# Patient Record
Sex: Female | Born: 1945 | Race: Asian | Hispanic: No | Marital: Married | Smoking: Never smoker
Health system: Southern US, Community
[De-identification: ages and names within clinical notes are randomized; demographics above are authoritative.]

## PROBLEM LIST (undated history)

## (undated) DIAGNOSIS — G43009 Migraine without aura, not intractable, without status migrainosus: Secondary | ICD-10-CM

## (undated) DIAGNOSIS — T7840XA Allergy, unspecified, initial encounter: Secondary | ICD-10-CM

## (undated) DIAGNOSIS — I10 Essential (primary) hypertension: Secondary | ICD-10-CM

## (undated) DIAGNOSIS — E785 Hyperlipidemia, unspecified: Secondary | ICD-10-CM

## (undated) HISTORY — PX: POLYPECTOMY: SHX149

## (undated) HISTORY — PX: COLONOSCOPY: SHX174

## (undated) HISTORY — PX: CARPAL TUNNEL RELEASE: SHX101

## (undated) HISTORY — DX: Migraine without aura, not intractable, without status migrainosus: G43.009

## (undated) HISTORY — PX: TYMPANOSTOMY TUBE PLACEMENT: SHX32

## (undated) HISTORY — DX: Hyperlipidemia, unspecified: E78.5

## (undated) HISTORY — DX: Allergy, unspecified, initial encounter: T78.40XA

---

## 1998-01-21 ENCOUNTER — Other Ambulatory Visit: Admission: RE | Admit: 1998-01-21 | Discharge: 1998-01-21 | Payer: Self-pay | Admitting: Obstetrics and Gynecology

## 1999-02-19 ENCOUNTER — Other Ambulatory Visit: Admission: RE | Admit: 1999-02-19 | Discharge: 1999-02-19 | Payer: Self-pay | Admitting: Obstetrics and Gynecology

## 2000-02-23 ENCOUNTER — Other Ambulatory Visit: Admission: RE | Admit: 2000-02-23 | Discharge: 2000-02-23 | Payer: Self-pay | Admitting: Obstetrics and Gynecology

## 2001-01-24 ENCOUNTER — Other Ambulatory Visit: Admission: RE | Admit: 2001-01-24 | Discharge: 2001-01-24 | Payer: Self-pay | Admitting: Obstetrics and Gynecology

## 2003-11-05 ENCOUNTER — Encounter: Admission: RE | Admit: 2003-11-05 | Discharge: 2003-11-05 | Payer: Self-pay | Admitting: Family Medicine

## 2005-12-22 ENCOUNTER — Ambulatory Visit: Payer: Self-pay | Admitting: Family Medicine

## 2006-03-31 ENCOUNTER — Ambulatory Visit: Payer: Self-pay | Admitting: Internal Medicine

## 2006-05-20 ENCOUNTER — Ambulatory Visit: Payer: Self-pay | Admitting: Family Medicine

## 2006-11-23 ENCOUNTER — Encounter (INDEPENDENT_AMBULATORY_CARE_PROVIDER_SITE_OTHER): Payer: Self-pay | Admitting: Family Medicine

## 2006-12-15 ENCOUNTER — Ambulatory Visit: Payer: Self-pay | Admitting: Family Medicine

## 2006-12-15 DIAGNOSIS — G43009 Migraine without aura, not intractable, without status migrainosus: Secondary | ICD-10-CM | POA: Insufficient documentation

## 2007-01-07 ENCOUNTER — Ambulatory Visit: Payer: Self-pay | Admitting: Family Medicine

## 2007-01-07 DIAGNOSIS — J309 Allergic rhinitis, unspecified: Secondary | ICD-10-CM | POA: Insufficient documentation

## 2007-01-17 ENCOUNTER — Telehealth (INDEPENDENT_AMBULATORY_CARE_PROVIDER_SITE_OTHER): Payer: Self-pay | Admitting: Family Medicine

## 2007-01-20 ENCOUNTER — Telehealth (INDEPENDENT_AMBULATORY_CARE_PROVIDER_SITE_OTHER): Payer: Self-pay | Admitting: *Deleted

## 2007-02-21 ENCOUNTER — Ambulatory Visit: Payer: Self-pay | Admitting: Family Medicine

## 2007-02-27 LAB — CONVERTED CEMR LAB
Glucose, Bld: 99 mg/dL (ref 70–99)
TSH: 1.6 microintl units/mL (ref 0.35–5.50)
Triglycerides: 60 mg/dL (ref 0–149)
VLDL: 12 mg/dL (ref 0–40)

## 2007-02-28 ENCOUNTER — Encounter (INDEPENDENT_AMBULATORY_CARE_PROVIDER_SITE_OTHER): Payer: Self-pay | Admitting: *Deleted

## 2007-02-28 ENCOUNTER — Telehealth (INDEPENDENT_AMBULATORY_CARE_PROVIDER_SITE_OTHER): Payer: Self-pay | Admitting: *Deleted

## 2007-04-11 ENCOUNTER — Ambulatory Visit: Payer: Self-pay | Admitting: Internal Medicine

## 2007-06-13 ENCOUNTER — Ambulatory Visit: Payer: Self-pay | Admitting: Internal Medicine

## 2007-06-20 ENCOUNTER — Telehealth (INDEPENDENT_AMBULATORY_CARE_PROVIDER_SITE_OTHER): Payer: Self-pay | Admitting: *Deleted

## 2007-06-20 ENCOUNTER — Ambulatory Visit: Payer: Self-pay | Admitting: Internal Medicine

## 2007-07-01 ENCOUNTER — Telehealth (INDEPENDENT_AMBULATORY_CARE_PROVIDER_SITE_OTHER): Payer: Self-pay | Admitting: *Deleted

## 2007-07-11 ENCOUNTER — Encounter (INDEPENDENT_AMBULATORY_CARE_PROVIDER_SITE_OTHER): Payer: Self-pay | Admitting: *Deleted

## 2007-07-25 ENCOUNTER — Telehealth (INDEPENDENT_AMBULATORY_CARE_PROVIDER_SITE_OTHER): Payer: Self-pay | Admitting: *Deleted

## 2007-07-26 ENCOUNTER — Ambulatory Visit: Payer: Self-pay | Admitting: Internal Medicine

## 2007-07-27 ENCOUNTER — Encounter: Payer: Self-pay | Admitting: Internal Medicine

## 2007-07-27 ENCOUNTER — Telehealth (INDEPENDENT_AMBULATORY_CARE_PROVIDER_SITE_OTHER): Payer: Self-pay | Admitting: *Deleted

## 2007-08-01 ENCOUNTER — Telehealth (INDEPENDENT_AMBULATORY_CARE_PROVIDER_SITE_OTHER): Payer: Self-pay | Admitting: *Deleted

## 2007-11-18 ENCOUNTER — Telehealth (INDEPENDENT_AMBULATORY_CARE_PROVIDER_SITE_OTHER): Payer: Self-pay | Admitting: *Deleted

## 2007-11-22 ENCOUNTER — Ambulatory Visit: Payer: Self-pay | Admitting: Internal Medicine

## 2007-11-28 ENCOUNTER — Encounter: Payer: Self-pay | Admitting: Internal Medicine

## 2008-01-27 ENCOUNTER — Encounter: Payer: Self-pay | Admitting: Internal Medicine

## 2008-06-15 ENCOUNTER — Ambulatory Visit: Payer: Self-pay | Admitting: Internal Medicine

## 2008-11-14 ENCOUNTER — Telehealth (INDEPENDENT_AMBULATORY_CARE_PROVIDER_SITE_OTHER): Payer: Self-pay | Admitting: *Deleted

## 2008-12-07 ENCOUNTER — Encounter: Payer: Self-pay | Admitting: Internal Medicine

## 2008-12-21 ENCOUNTER — Ambulatory Visit: Payer: Self-pay | Admitting: Internal Medicine

## 2009-03-27 ENCOUNTER — Ambulatory Visit: Payer: Self-pay | Admitting: Internal Medicine

## 2009-07-10 ENCOUNTER — Telehealth (INDEPENDENT_AMBULATORY_CARE_PROVIDER_SITE_OTHER): Payer: Self-pay | Admitting: *Deleted

## 2009-07-12 ENCOUNTER — Encounter (INDEPENDENT_AMBULATORY_CARE_PROVIDER_SITE_OTHER): Payer: Self-pay | Admitting: *Deleted

## 2009-08-06 ENCOUNTER — Encounter: Payer: Self-pay | Admitting: Internal Medicine

## 2009-08-20 ENCOUNTER — Ambulatory Visit: Payer: Self-pay | Admitting: Internal Medicine

## 2009-08-23 ENCOUNTER — Telehealth (INDEPENDENT_AMBULATORY_CARE_PROVIDER_SITE_OTHER): Payer: Self-pay | Admitting: *Deleted

## 2009-08-26 ENCOUNTER — Ambulatory Visit: Payer: Self-pay | Admitting: Internal Medicine

## 2009-08-29 LAB — CONVERTED CEMR LAB: Potassium: 4.9 meq/L (ref 3.5–5.3)

## 2009-08-30 ENCOUNTER — Encounter: Payer: Self-pay | Admitting: Internal Medicine

## 2009-12-11 ENCOUNTER — Encounter: Payer: Self-pay | Admitting: Internal Medicine

## 2010-01-02 ENCOUNTER — Ambulatory Visit: Payer: Self-pay | Admitting: Internal Medicine

## 2010-04-26 ENCOUNTER — Encounter: Payer: Self-pay | Admitting: Family Medicine

## 2010-05-04 LAB — CONVERTED CEMR LAB
ALT: 19 units/L (ref 0–35)
AST: 23 units/L (ref 0–37)
Alkaline Phosphatase: 50 units/L (ref 39–117)
BUN: 18 mg/dL (ref 6–23)
Basophils Absolute: 0 10*3/uL (ref 0.0–0.1)
Calcium: 10.1 mg/dL (ref 8.4–10.5)
Eosinophils Relative: 2.5 % (ref 0.0–5.0)
GFR calc non Af Amer: 90.11 mL/min (ref >60)
Glucose, Bld: 88 mg/dL (ref 70–99)
HCT: 39.8 % (ref 36.0–46.0)
Hemoglobin: 13.9 g/dL (ref 12.0–15.0)
LDL Cholesterol: 94 mg/dL (ref 0–99)
Lymphocytes Relative: 48 % — ABNORMAL HIGH (ref 12.0–46.0)
Monocytes Relative: 7.2 % (ref 3.0–12.0)
Platelets: 214 10*3/uL (ref 150.0–400.0)
Potassium: 5.8 meq/L — ABNORMAL HIGH (ref 3.5–5.1)
RDW: 12.2 % (ref 11.5–14.6)
Sodium: 143 meq/L (ref 135–145)
TSH: 0.88 microintl units/mL (ref 0.35–5.50)
Total Bilirubin: 0.4 mg/dL (ref 0.3–1.2)
Total CHOL/HDL Ratio: 3
VLDL: 15.8 mg/dL (ref 0.0–40.0)
WBC: 4.8 10*3/uL (ref 4.5–10.5)

## 2010-05-08 NOTE — Letter (Signed)
Summary: [facility]--RX CTS release surgery   [facility]   Imported By: Lanelle Bal 09/19/2009 09:46:42  _____________________________________________________________________  External Attachment:    Type:   Image     Comment:   External Document

## 2010-05-08 NOTE — Progress Notes (Signed)
Summary: results, [facility] 5/20 pt called  Phone Note Outgoing Call Call back at [facility] Phone [phone] Call back at Work Phone [phone]   Reason for Call: Discuss lab or test results Details for Reason: advised patient her vitamin D, cholesterol levels are good Her potassium is elevated, unclear reason, please ask her to come back this afternoon or next week for a potassium recheck--- DX hyperkalemia all other labs are within normal Good results Signed by [facility] E. Paz MD on 08/23/2009 at 11:47 AM  Summary of Call: left message on machine for pt to return call...........Marland KitchenShary Decamp  Aug 23, 2009 4:37 PM   Follow-up for Phone Call        pt called back, informed of lab. lab scheduled .Kandice Hams  Aug 26, 2009 11:30 AM  Follow-up by: Kandice Hams,  Aug 26, 2009 11:30 AM

## 2010-05-08 NOTE — Letter (Signed)
Summary: Primary Care Appointment Letter  [facility] [place]  [address] [place], [place] [address]   Phone: [phone]  Fax: [phone]    07/12/2009 MRN: [id number]  Lori Goodwin [address] [place], [place]  [address]  Dear Ms. Greggory Stallion,   Your Primary Care Physician Newton E. Paz MD has indicated that:    ___x____it is time to schedule an appointment. Due for a physical    _______you missed your appointment on______ and need to call and          reschedule.    _______you need to have lab work done.    _______you need to schedule an appointment discuss lab or test results.    _______you need to call to reschedule your appointment that is                       scheduled on _________.     Please call our office as soon as possible. Our phone number is [phone]_______. Please press option 1. Our office is open 8a-12noon and 1p-5p, Monday through Friday.     Thank you,    [facility]

## 2010-05-08 NOTE — Progress Notes (Signed)
Summary: due cpx  Phone Note Call from Patient Call back at Home Phone [phone]   Summary of Call: Patient is requesting a referral to Dr. Newell Coral for neck pain.  Patient saw him a couple of years ago for the pain.  She has started having pain again & tried to make an appt but needs referral from PCP.  OK to refer even though we have not seen her for this problem? Shary Decamp  July 10, 2009 4:50 PM   Follow-up for Phone Call        okay to refer, also she is in need of a CPX.  Please arrange if patient agreeable Follow-up by: Jose E. Paz MD,  July 11, 2009 9:50 AM  Additional Follow-up for Phone Call Additional follow up Details #1::        Kendra - pls call pt to schedule CPX referral done. Shary Decamp  July 11, 2009 11:04 AM     Additional Follow-up for Phone Call Additional follow up Details #2::    LMTCB Follow-up by: Barb Merino,  July 11, 2009 11:05 AM  Additional Follow-up for Phone Call Additional follow up Details #3:: Details for Additional Follow-up Action Taken: mailed a letter Additional Follow-up by: Barb Merino,  July 12, 2009 10:25 AM

## 2010-05-08 NOTE — Assessment & Plan Note (Signed)
Summary: SINUS INF?///[facility]--NEEDED LAST APPT OF DAY///[facility]   Vital Signs:  Patient profile:   65 year old female Weight:      149.13 pounds Temp:     98.4 degrees F oral Pulse rate:   63 / minute Pulse rhythm:   regular BP sitting:   126 / 84  (left arm) Cuff size:   regular  Vitals Entered By: Army Fossa CMA (January 02, 2010 4:11 PM) CC: Pt here for a cough Comments - has had for 1 week. - having HA's also.    History of Present Illness: 2 weeks history of cough and some frontal headache which is different from her migraines  Current Medications (verified): 1)  Propranolol Hcl Cr 60 Mg Xr24h-Cap (Propranolol Hcl) .[name].. 1 By Mouth Once Daily 2)  Imitrex 50 Mg  Tabs (Sumatriptan Succinate) .... Take One To Two  Tablets Daily As Needed For Has 3)  Femhrt Low Dose 0.5-2.5 Mg-Mcg  Tabs (Norethindrone-Eth Estradiol) .[name].. 1 By Mouth Qd  Allergies (verified): No Known Drug Allergies  Past History:  Past Medical History: Reviewed history from 06/15/2008 and no changes required. Allergic rhinitis COMMON MIGRAINE    Past Surgical History: Reviewed history from 06/15/2008 and no changes required. L ear tubes   Social History: Reviewed history from 08/20/2009 and no changes required. 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 ENT:  no sore throat. Resp:  very little sputum, color?. GI:  no nausea or vomiting. MS:  no myalgias.  Physical Exam  General:  alert, well-developed, and well-nourished.  alert, well-developed, and well-nourished.   Head:  face symmetric no tenderness to palpation of the sinus area Ears:  tmpanic membranes without redness or discharge Nose:  slightly congested Mouth:  tonsils normal size, no  redness Lungs:  normal respiratory effort, no intercostal retractions, no accessory muscle use, and normal breath sounds.   Heart:  normal rate, regular rhythm, and no murmur.  normal rate, regular rhythm, and no murmur.     Impression & Recommendations:  Problem # 1:  BRONCHITIS- ACUTE (ICD-466.0) sx consistent with bronchitis, seen instructions Her updated medication list for this problem includes:    Zithromax Z-pak 250 Mg Tabs (Azithromycin) .[name]... As directed  Complete Medication List: 1)  Propranolol Hcl Cr 60 Mg Xr24h-cap (Propranolol hcl) .[name].. 1 by mouth once daily 2)  Imitrex 50 Mg Tabs (Sumatriptan succinate) .... Take one to two  tablets daily as needed for has 3)  Femhrt Low Dose 0.5-2.5 Mg-mcg Tabs (Norethindrone-eth estradiol) .[name].. 1 by mouth qd 4)  Zithromax Z-pak 250 Mg Tabs (Azithromycin) .... As directed  Patient Instructions: 1)  rest, fluids 2)  robitussin DM 2 tsp every 6 hours as needed  3)  tylenol as needed  4)  call if no better in few days  5)  came back for a flu shot once you feel better  Prescriptions: ZITHROMAX Z-PAK 250 MG TABS (AZITHROMYCIN) as directed  #1 x 0   Entered and Authorized by:   Nolon Rod. Paz MD   Signed by:   Nolon Rod. Paz MD on 01/02/2010   Method used:   Electronically to        [facility] [phone]* (retail)  [address]       [place], [place]  [address]       Ph: [phone]       Fax: [phone]   RxID:   [id number]

## 2010-05-08 NOTE — Consult Note (Signed)
Summary: [facility] --carpal tunnel syndrome  [facility]   Imported By: Lanelle Bal 08/27/2009 12:59:21  _____________________________________________________________________  External Attachment:    Type:   Image     Comment:   External Document

## 2010-05-08 NOTE — Assessment & Plan Note (Signed)
Summary: cpx/kdc   Vital Signs:  Patient profile:   65 year old female Height:      63 inches Weight:      148.6 pounds BMI:     26.42 Pulse rate:   60 / minute BP sitting:   100 / 60  Vitals Entered By: Shary Decamp (Aug 20, 2009 3:18 PM) CC: cpx, no pap (has gyn)   History of Present Illness: CPX feels well  was seen by neurosurgery for neck pain, had a  NCS    Current Medications (verified): 1)  Propranolol Hcl Cr 80 Mg  Cp24 (Propranolol Hcl) .... Take One Tablet Daily 2)  Imitrex 50 Mg  Tabs (Sumatriptan Succinate) .... Take One To Two  Tablets Daily As Needed For Has 3)  Femhrt Low Dose 0.5-2.5 Mg-Mcg  Tabs (Norethindrone-Eth Estradiol) .[name].. 1 By Mouth Qd  Allergies (verified): No Known Drug Allergies  Past History:  Past Medical History: Reviewed history from 06/15/2008 and no changes required. Allergic rhinitis COMMON MIGRAINE    Past Surgical History: Reviewed history from 06/15/2008 and no changes required. L ear tubes   Family History: Reviewed history from 12/21/2008 and no changes required. 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 ENT:  still has some allergies, occasionally cough. CV:  Denies chest pain or discomfort and swelling of feet. Resp:  Denies cough and shortness of breath. GI:  Denies bloody stools, nausea, and vomiting. MS:  occasionally L dorsum of the foot  (L). Psych:  Denies anxiety and depression.  Physical Exam  General:  alert, well-developed, and well-nourished.   Neck:  no masses, no thyromegaly, and normal carotid upstroke.   Lungs:  normal respiratory effort, no intercostal retractions, no  accessory muscle use, and normal breath sounds.   Heart:  normal rate, regular rhythm, and no murmur.   Abdomen:  soft, non-tender, no distention, no masses, no guarding, and no rigidity.   Extremities:  no pretibial edema bilaterally  Feet inspection and palpation normal  Psych:  Cognition and judgment appear intact. Alert and cooperative with normal attention span and concentration.  not anxious appearing and not depressed appearing.     Impression & Recommendations:  Problem # 1:  HEALTH SCREENING (ICD-V70.0) TD-- today has a gynecologist on menopause x 10 years, was rec to have OTC ca+ , on HRT x years never had a DEXA , i  don't see the  need for a DEXA at this point  EKG--normal sinus rhythm continue with healthy lifestyle  Orders: Venipuncture ([address]) TLB-BMP (Basic Metabolic Panel-BMET) (80048-METABOL) TLB-CBC Platelet - w/Differential (85025-CBCD) TLB-Hepatic/Liver Function Pnl (80076-HEPATIC) TLB-Lipid Panel (80061-LIPID) TLB-TSH (Thyroid Stimulating Hormone) (84443-TSH) EKG w/ Interpretation (93000) T-Vitamin D (25-Hydroxy) ([address])  Problem # 2:  History of neck pain status post evaluation by Dr. Newell Coral, had a NCS, results pending  Problem # 3:  COMMON MIGRAINE (ICD-346.10) on BB for migraine prevention would like to discontinue propranolol to see how she's doing Recommend again to discontinue propanolol, we agreed to decrease the dose from 80 to 60 mg daily. reassess later how she is doing and continue decreasing the dose if appropriate Her updated medication list for this problem  includes:    Propranolol Hcl Cr 60 Mg Xr24h-cap (Propranolol hcl) .[name]... 1 by mouth once daily    Imitrex 50 Mg Tabs (Sumatriptan succinate) .[name]... Take one to two  tablets daily as needed for has  Complete Medication List: 1)  Propranolol Hcl Cr 60 Mg Xr24h-cap (Propranolol hcl) .[name].. 1 by mouth once daily 2)  Imitrex 50 Mg Tabs (Sumatriptan succinate) .... Take one to two   tablets daily as needed for has 3)  Femhrt Low Dose 0.5-2.5 Mg-mcg Tabs (Norethindrone-eth estradiol) .[name].. 1 by mouth qd  Other Orders: Tdap => 2yrs IM ([address]) Admin 1st Vaccine ([address])  Patient Instructions: 1)  Please schedule a follow-up appointment in 1 year.  Prescriptions: PROPRANOLOL HCL CR 60 MG XR24H-CAP (PROPRANOLOL HCL) 1 by mouth once daily  #90 x 3   Entered and Authorized by:   Elita Quick E. Caitriona Sundquist MD   Signed by:   Nolon Rod. Kye Hedden MD on 08/20/2009   Method used:   Electronically to        [facility] [phone]* (retail)       [address]       [place], [place]  [address]       Ph: [phone]       Fax: [phone]   RxID:   [id number]      Immunizations Administered:  Tetanus Vaccine:    Vaccine Type: Tdap    Site: right deltoid    Dose: 0.5 ml    Route: IM    Given by: Shary Decamp    Exp. Date: 06/29/2011    Lot #: [id number]

## 2010-05-30 ENCOUNTER — Encounter: Payer: Self-pay | Admitting: Internal Medicine

## 2010-05-30 ENCOUNTER — Ambulatory Visit (INDEPENDENT_AMBULATORY_CARE_PROVIDER_SITE_OTHER): Payer: BC Managed Care – PPO | Admitting: Internal Medicine

## 2010-05-30 DIAGNOSIS — J309 Allergic rhinitis, unspecified: Secondary | ICD-10-CM

## 2010-06-03 NOTE — Assessment & Plan Note (Signed)
Summary: Congestion/drainage-CM   Vital Signs:  Patient profile:   65 year old female Height:      63 inches Weight:      149.25 pounds BMI:     26.53 Temp:     98.2 degrees F oral Pulse rate:   70 / minute Pulse rhythm:   regular BP sitting:   134 / 86  (left arm) Cuff size:   regular  Vitals Entered By: Army Fossa CMA (May 30, 2010 11:41 AM) CC: Pt here c/o nasal congestion Comments -allergies - x 4 days Taking OTC meds [facility] [address]    History of Present Illness:  symptoms started 5 days ago  complaints of runny nose with abundant clear discharge and sneezing   she is taking over-the-counter Claritin with some help    ROS No fever Denies sore throat or cough No chest congestion admits to itchy nose and slightly itchy eyes  Current Medications (verified): 1)  Propranolol Hcl Cr 60 Mg Xr24h-Cap (Propranolol Hcl) .[name].. 1 By Mouth Once Daily 2)  Imitrex 50 Mg  Tabs (Sumatriptan Succinate) .... Take One To Two  Tablets Daily As Needed For Has 3)  Femhrt Low Dose 0.5-2.5 Mg-Mcg  Tabs (Norethindrone-Eth Estradiol) .[name].. 1 By Mouth Qd  Allergies (verified): No Known Drug Allergies  Past History:  Past Medical History: Reviewed history from 06/15/2008 and no changes required. Allergic rhinitis COMMON MIGRAINE    Past Surgical History: Reviewed history from 06/15/2008 and no changes required. L ear tubes   Social History: Reviewed history from 08/20/2009 and no changes required. 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   Head:   face is symmetric, ? Slightly tender at the left maxillary sinus Ears:  R ear normal and L ear normal.   Nose:   slightly congested Mouth:   no  redness or discharge Lungs:  normal respiratory effort, no intercostal retractions, no accessory muscle use, and normal breath sounds.     Impression & Recommendations:  Problem # 1:  ALLERGIC RHINITIS (ICD-477.9)  allergies versus URI Seen instructions Her updated medication list for this problem includes:    Astepro 0.15 % Soln (Azelastine hcl) .[name]... 2 sprays on each side of the nose two times a day    Loratadine 10 Mg Tabs (Loratadine) .[name]... 1 a day as needed for allergies  Complete Medication List: 1)  Propranolol Hcl Cr 60 Mg Xr24h-cap (Propranolol hcl) .[name].. 1 by mouth once daily 2)  Imitrex 50 Mg Tabs (Sumatriptan succinate) .... Take one to two  tablets daily as needed for has 3)  Femhrt Low Dose 0.5-2.5 Mg-mcg Tabs (Norethindrone-eth estradiol) .[name].. 1 by mouth qd 4)  Astepro 0.15 % Soln (Azelastine hcl) .... 2 sprays on each side of the nose two times a day 5)  Loratadine 10 Mg Tabs (Loratadine) .[name].. 1 a day as needed for allergies  Patient Instructions: 1)  continue with claritin OTC as needed 2)  start astepro, nasal spray as prescribed, you can take this for months 3)  call if symptoms increase,  severe, fever or if no better  Prescriptions: ASTEPRO 0.15 % SOLN (AZELASTINE HCL) 2 sprays on each side of the nose two times a day  #1 x  6   Entered and Authorized by:   Nolon Rod. Pamella Samons MD   Signed by:   Nolon Rod. Brain Honeycutt MD on 05/30/2010   Method used:   Print then Give to Patient   RxID:   [id number]    Orders Added: 1)  Est. Patient Level III [14782]

## 2010-07-01 ENCOUNTER — Telehealth: Payer: Self-pay | Admitting: *Deleted

## 2010-07-01 DIAGNOSIS — J069 Acute upper respiratory infection, unspecified: Secondary | ICD-10-CM

## 2010-07-01 NOTE — Telephone Encounter (Signed)
She was seen w/ a URI, allergic to PCN Call in a zpack, if no better in few days, needs OV

## 2010-07-02 MED ORDER — AZITHROMYCIN 250 MG PO TABS: ORAL_TABLET | ORAL | Status: DC

## 2010-07-02 NOTE — Telephone Encounter (Signed)
I spoke w/ pt and she is aware that med was sent in.

## 2010-07-02 NOTE — Telephone Encounter (Signed)
Left detailed msg medication to be sent to to pharmacy.

## 2010-07-07 ENCOUNTER — Other Ambulatory Visit: Payer: Self-pay | Admitting: Internal Medicine

## 2010-07-07 NOTE — Telephone Encounter (Signed)
Ok 10, 3 RF

## 2010-08-22 ENCOUNTER — Other Ambulatory Visit: Payer: Self-pay | Admitting: Internal Medicine

## 2010-10-30 ENCOUNTER — Encounter: Payer: Self-pay | Admitting: Internal Medicine

## 2010-10-31 ENCOUNTER — Ambulatory Visit (INDEPENDENT_AMBULATORY_CARE_PROVIDER_SITE_OTHER): Payer: BC Managed Care – PPO | Admitting: Internal Medicine

## 2010-10-31 ENCOUNTER — Encounter: Payer: Self-pay | Admitting: Internal Medicine

## 2010-10-31 ENCOUNTER — Other Ambulatory Visit: Payer: Self-pay | Admitting: Internal Medicine

## 2010-10-31 DIAGNOSIS — Z780 Asymptomatic menopausal state: Secondary | ICD-10-CM

## 2010-10-31 DIAGNOSIS — G43009 Migraine without aura, not intractable, without status migrainosus: Secondary | ICD-10-CM

## 2010-10-31 DIAGNOSIS — N951 Menopausal and female climacteric states: Secondary | ICD-10-CM

## 2010-10-31 DIAGNOSIS — R5383 Other fatigue: Secondary | ICD-10-CM | POA: Insufficient documentation

## 2010-10-31 LAB — CBC WITH DIFFERENTIAL/PLATELET
Basophils Absolute: 0 10*3/uL (ref 0.0–0.1)
Basophils Relative: 0 % (ref 0–1)
Eosinophils Relative: 4 % (ref 0–5)
HCT: 40.4 % (ref 36.0–46.0)
Lymphocytes Relative: 32 % (ref 12–46)
MCHC: 33.4 g/dL (ref 30.0–36.0)
MCV: 94 fL (ref 78.0–100.0)
Monocytes Absolute: 0.4 10*3/uL (ref 0.1–1.0)
RDW: 12 % (ref 11.5–15.5)

## 2010-10-31 LAB — BASIC METABOLIC PANEL
CO2: 23 mEq/L (ref 19–32)
Glucose, Bld: 151 mg/dL — ABNORMAL HIGH (ref 70–99)
Potassium: 4.6 mEq/L (ref 3.5–5.3)
Sodium: 141 mEq/L (ref 135–145)

## 2010-10-31 MED ORDER — SUMATRIPTAN SUCCINATE 50 MG PO TABS: ORAL_TABLET | ORAL | Status: DC

## 2010-10-31 NOTE — Assessment & Plan Note (Addendum)
New problem Suspect may be related to d/c HRT Plan: Labs Restart HRT (to see if help w/ HA-fatigue) Will have to discuss w / gyn further HRT

## 2010-10-31 NOTE — Assessment & Plan Note (Signed)
Increase HAs in the last 2 weeks, although the location is slt different that traditional migraines, HA still respond to imitrex. Suspect increase in HA related to d/c of HRT. Plan: Restart HRT If HAs better,  then she needs to dicuss w/ gyn alternatives for HRT

## 2010-10-31 NOTE — Patient Instructions (Signed)
Go back to hormones, if you feels better, talk to your gynecologist about other options If you are not better, then call me

## 2010-10-31 NOTE — Progress Notes (Signed)
  Subjective:    Patient ID: Lori Goodwin, female    DOB: May 15, 1945, 65 y.o.   MRN: [id number]  HPI For the last 2 weeks, she has not been feeling well. Report increasing frequency of headaches, the location is slightly different that previous migraines, lately located at the forehead; she wakes up most days with a headache, takes an Imitrex and feels better the rest of the day. Also complains of fatigue, described as just a lack of energy. Coincidentally,earlier  this month her gynecologist recommended to stop HRT.  Past Medical History  Diagnosis Date  . Allergic rhinitis   . Common migraine    Past Surgical History  Procedure Date  . Tympanostomy tube placement      Review of Systems Denies fever or chills No cough or shortness of breath Denies any sinus congestion, sinus pain, runny nose, itchy eyes or sneezing.    Objective:   Physical Exam  Constitutional: She is oriented to person, place, and time. She appears well-developed and well-nourished. No distress.  HENT:  Head: Normocephalic and atraumatic.  Right Ear: External ear normal.  Left Ear: External ear normal.  Cardiovascular: Normal rate, regular rhythm and normal heart sounds.   No murmur heard. Pulmonary/Chest: Effort normal and breath sounds normal. No respiratory distress. She has no wheezes. She has no rales.  Neurological: She is alert and oriented to person, place, and time.       Speech, gait and motor exam are normal  Skin: She is not diaphoretic.  Psychiatric: She has a normal mood and affect. Her behavior is normal. Judgment and thought content normal.          Assessment & Plan:   Fatigue New problem Suspect may be related to d/c HRT Plan: Labs Restart HRT (to see if help w/ HA-fatigue) Will have to discuss w / gyn further HRT  COMMON MIGRAINE Increase HAs in the last 2 weeks, although the location is slt different that traditional migraines, HA still respond to imitrex. Suspect  increase in HA related to d/c of HRT. Plan: Restart HRT If HAs better,  then she needs to dicuss w/ gyn alternatives for HRT

## 2010-11-04 ENCOUNTER — Telehealth: Payer: Self-pay | Admitting: *Deleted

## 2010-11-04 NOTE — Telephone Encounter (Signed)
Message left for patient to return my call. Sent over add on

## 2010-11-04 NOTE — Telephone Encounter (Signed)
Spoke w/ pt aware of labs and information

## 2010-11-04 NOTE — Telephone Encounter (Signed)
Message copied by Leanne Lovely on Tue Nov 04, 2010  9:51 AM ------      Message from: Lori Goodwin      Created: Mon Nov 03, 2010 10:03 PM       Advise patient:      Labs ok , just the sugar slt elevated, add A1C--- dx hyperglycemia

## 2010-11-05 LAB — HEMOGLOBIN A1C: Mean Plasma Glucose: 120 mg/dL — ABNORMAL HIGH (ref <117)

## 2010-11-10 ENCOUNTER — Telehealth: Payer: Self-pay | Admitting: *Deleted

## 2010-11-10 NOTE — Telephone Encounter (Signed)
Pt left message on triage voicemail returning your call. Pt can be reached at [phone].

## 2010-11-10 NOTE — Telephone Encounter (Signed)
Message copied by Romualdo Bolk on Mon Nov 10, 2010 10:06 AM ------      Message from: Lori Goodwin      Created: Mon Nov 10, 2010  9:29 AM       Advise patient, her sugar was a little high, a diabetes test was slt elevated as well, no need for any medications his just needs to keep herself active and eat healthy, this number call hemoglobin A1c needs to be rechecked when she comes back

## 2010-11-10 NOTE — Telephone Encounter (Signed)
Left message to call back  

## 2010-11-10 NOTE — Telephone Encounter (Signed)
Pt aware of results 

## 2010-12-03 ENCOUNTER — Other Ambulatory Visit: Payer: Self-pay | Admitting: Internal Medicine

## 2010-12-03 NOTE — Telephone Encounter (Signed)
Rx Done . 

## 2011-01-01 ENCOUNTER — Encounter: Payer: Self-pay | Admitting: Internal Medicine

## 2011-02-23 ENCOUNTER — Other Ambulatory Visit: Payer: Self-pay | Admitting: Internal Medicine

## 2011-02-23 MED ORDER — SUMATRIPTAN SUCCINATE 50 MG PO TABS: ORAL_TABLET | ORAL | Status: DC

## 2011-02-23 NOTE — Telephone Encounter (Signed)
Rx sent to pharamcy

## 2011-03-10 ENCOUNTER — Other Ambulatory Visit: Payer: Self-pay | Admitting: Internal Medicine

## 2011-06-08 ENCOUNTER — Other Ambulatory Visit: Payer: Self-pay | Admitting: Internal Medicine

## 2011-06-09 NOTE — Telephone Encounter (Signed)
Refill done.  

## 2011-06-22 ENCOUNTER — Telehealth: Payer: Self-pay | Admitting: Internal Medicine

## 2011-06-22 MED ORDER — AZELASTINE HCL 0.1 % NA SOLN: 2.0000 | Freq: Every day | NASAL | Status: DC

## 2011-06-22 NOTE — Telephone Encounter (Signed)
Patient has an appointment for 3.20-to see dr. Drue Novel, but she would like to know if she could get a nasal spray called in to help her until the appointment to help her sleep? Patient ph# [phone]

## 2011-06-22 NOTE — Telephone Encounter (Signed)
Discuss with patient  

## 2011-06-22 NOTE — Telephone Encounter (Signed)
Advise pt , i sent a Rx for astelin

## 2011-06-24 ENCOUNTER — Ambulatory Visit: Payer: BC Managed Care – PPO | Admitting: Internal Medicine

## 2011-07-07 ENCOUNTER — Ambulatory Visit (INDEPENDENT_AMBULATORY_CARE_PROVIDER_SITE_OTHER): Payer: BC Managed Care – PPO | Admitting: Internal Medicine

## 2011-07-07 ENCOUNTER — Encounter: Payer: Self-pay | Admitting: Internal Medicine

## 2011-07-07 VITALS — BP 124/86 | HR 61 | Temp 98.3°F | Wt 146.0 lb

## 2011-07-07 DIAGNOSIS — R21 Rash and other nonspecific skin eruption: Secondary | ICD-10-CM

## 2011-07-07 DIAGNOSIS — B9789 Other viral agents as the cause of diseases classified elsewhere: Secondary | ICD-10-CM

## 2011-07-07 DIAGNOSIS — B349 Viral infection, unspecified: Secondary | ICD-10-CM

## 2011-07-07 MED ORDER — HYDROCORTISONE 2.5 % EX CREA: TOPICAL_CREAM | Freq: Two times a day (BID) | CUTANEOUS | Status: DC

## 2011-07-07 MED ORDER — AZELASTINE HCL 0.1 % NA SOLN: 2.0000 | Freq: Every evening | NASAL | Status: DC | PRN

## 2011-07-07 MED ORDER — AMOXICILLIN 500 MG PO CAPS: 1000.0000 mg | ORAL_CAPSULE | Freq: Two times a day (BID) | ORAL | Status: AC

## 2011-07-07 NOTE — Progress Notes (Signed)
  Subjective:    Patient ID: Lori Goodwin, female    DOB: 26-Nov-1945, 66 y.o.   MRN: [id number]  HPI  Acute visit Has a rash in the neck for more than a year, itchy. 2 days ago developed body aches, cough with a very small amount of clear sputum production, mild headaches and sinus pressure.  Past medical history Allergic rhinitis Migraines  Past surgical history Tympanostomy tube placement  Review of Systems Has some subjective fever and chills No nausea, vomiting, diarrhea. No chest congestion, wheezing or shortness of breath No sore throat. Some postnasal drip but not able to blow anything out of her nose.     Objective:   Physical Exam  Skin:       General -- alert, well-developed, and well-nourished. NAD  Neck --no LADs HEENT -- TMs normal, throat w/o redness, face symmetric and slt  tender to palpation on all sinuses, EOMI Lungs -- normal respiratory effort, no intercostal retractions, no accessory muscle use, and normal breath sounds.   Heart-- normal rate, regular rhythm, no murmur, and no gallop.   Skin -- see graphic  Extremities-- no pretibial edema bilaterally        Assessment & Plan:   viral syndrome, early sinusitis?. See instructions Rash, likely is atopic dermatitis. We'll try a steroid cream. If not better may need to be seen by dermatology

## 2011-07-07 NOTE — Patient Instructions (Signed)
Rest, fluids  For cough, take Mucinex DM twice a day as needed  For congestion use astelin nasal spray twice a day until you feel better Take the antibiotic as prescribed  (Amoxicillin) only if no better in few days  Call if no better in few days Call anytime if the symptoms are severe ------------------------------------------------------ Use the cream twice a day x 2 weeks, call if the rash gets worse

## 2011-09-22 ENCOUNTER — Ambulatory Visit (INDEPENDENT_AMBULATORY_CARE_PROVIDER_SITE_OTHER): Payer: BC Managed Care – PPO | Admitting: Internal Medicine

## 2011-09-22 VITALS — BP 116/78 | HR 58 | Temp 98.2°F | Wt 147.0 lb

## 2011-09-22 DIAGNOSIS — F419 Anxiety disorder, unspecified: Secondary | ICD-10-CM

## 2011-09-22 DIAGNOSIS — F411 Generalized anxiety disorder: Secondary | ICD-10-CM

## 2011-09-22 NOTE — Progress Notes (Signed)
  Subjective:    Patient ID: Lori Goodwin, female    DOB: 04/15/1945, 66 y.o.   MRN: [id number]  HPI Acute visit Patient comes today because she is very stressed out. Her son is paraplegic, he lives with her, he is going through depression, is not taking care of himself, history of substance abuse. His license was revoked due to a DWI.  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 No suicidal thoughts.     Objective:   Physical Exam  Alert oriented x3. She seems to stress but not depressed or extremely anxious. Coherent and pleasent     Assessment & Plan:

## 2011-09-22 NOTE — Assessment & Plan Note (Addendum)
Patient under a lot of stress due to family situation, see history of present illness. Her son is my patient as well, he will be seen this week. I counseled Lori Goodwin the best I could, because her son is my patient I can only listen but I can't discuss any information with her.  She requested me to set up counseling for him etc. unfortunately her son is completely competent to make his own decisions hence I can't force him to go through rehabilitation or see a counselor. Patient herself denies the need for medication

## 2011-09-23 ENCOUNTER — Encounter: Payer: Self-pay | Admitting: Internal Medicine

## 2011-09-30 ENCOUNTER — Ambulatory Visit: Payer: BC Managed Care – PPO | Admitting: Internal Medicine

## 2011-11-17 ENCOUNTER — Encounter: Payer: Self-pay | Admitting: Internal Medicine

## 2011-11-17 ENCOUNTER — Ambulatory Visit (INDEPENDENT_AMBULATORY_CARE_PROVIDER_SITE_OTHER): Payer: BC Managed Care – PPO | Admitting: Internal Medicine

## 2011-11-17 VITALS — BP 122/70 | HR 58 | Temp 97.5°F | Wt 152.6 lb

## 2011-11-17 DIAGNOSIS — M5481 Occipital neuralgia: Secondary | ICD-10-CM

## 2011-11-17 DIAGNOSIS — M5090 Cervical disc disorder, unspecified, unspecified cervical region: Secondary | ICD-10-CM

## 2011-11-17 DIAGNOSIS — M531 Cervicobrachial syndrome: Secondary | ICD-10-CM

## 2011-11-17 MED ORDER — GABAPENTIN 100 MG PO CAPS: ORAL_CAPSULE | ORAL | Status: DC

## 2011-11-17 MED ORDER — CYCLOBENZAPRINE HCL 5 MG PO TABS: ORAL_TABLET | ORAL | Status: AC

## 2011-11-17 NOTE — Progress Notes (Signed)
  Subjective:    Patient ID: Lori Goodwin, female    DOB: Feb 06, 1946, 66 y.o.   MRN: [id number]  HPI She describes tightness and pain at the base of the neck for the last week. There was no trigger or injury prior to the neck symptoms. Upon awakening she is noting constant gnnaging headache in both occipital areas since the neck pain started. She has a history of migraines has been taking generic Imitrex for the headaches with partial response. Advil did help the neck pain to some degree  Significantly to 4 years ago she had an MRI and saw Dr. Newell Coral, neurosurgeon for degenerative disc disease of the cervical spine.    Review of Systems History of repetitive motion:  Yes, caring for new grandchild   Numbness/tingling:  No except pre-existing CTS  Weakness: no Fever/chills /sweats:  no Bowel/bladder dysfunction:  no       Objective:   Physical Exam  Gen. appearance: Well-nourished, in no distress Eyes: Extraocular motion intact, field of vision normal, vision grossly intact with lenses, no nystagmus ENT: Canals :wax bilaterally,  tuning fork exam normal, hearing grossly decreased on L (chronic) Neck: Decreased range of motion, no masses, normal thyroid Cardiovascular: Rate and rhythm normal; Grade 1/6 systolic  Murmur; no gallops or extra heart sounds Muscle skeletal: Range of motion, tone, &  strength normal Neuro:no cranial nerve deficit, deep tendon  reflexes normal, gait normal. Romberg testing and finger to nose testing normal Lymph: No cervical or axillary LA Skin: Warm and dry without suspicious lesions or rashes Psych: no anxiety or mood change. Normally interactive and cooperative.         Assessment & Plan:  #1 neck pain due to degenerative disc disease; exacerbation issue repetitive motion caring for a new grandchild  #2 headache due to occipital neuritis secondary to #1. 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

## 2011-11-17 NOTE — Patient Instructions (Addendum)
Use an anti-inflammatory cream such as Aspercreme or Zostrix cream twice a day to the affected area as needed. In lieu of this warm moist compresses or  hot water bottle can be used. Do not apply ice . Use a cervical memory foam pillow to prevent hyperextension or hyperflexion of the cervical spine. 

## 2011-12-24 ENCOUNTER — Other Ambulatory Visit: Payer: Self-pay | Admitting: Internal Medicine

## 2011-12-25 ENCOUNTER — Encounter: Payer: Self-pay | Admitting: Internal Medicine

## 2012-01-03 ENCOUNTER — Other Ambulatory Visit: Payer: Self-pay | Admitting: Internal Medicine

## 2012-01-04 NOTE — Telephone Encounter (Signed)
Advise patient, I sent a prescription to her pharmacy but she is overdue for a checkup. Please arrange

## 2012-01-04 NOTE — Telephone Encounter (Signed)
Pt over due for labs. Refill request. Plz advise        MW

## 2012-01-04 NOTE — Telephone Encounter (Signed)
Left detailed msg to have pt call & schedule appt.

## 2012-01-15 ENCOUNTER — Ambulatory Visit (INDEPENDENT_AMBULATORY_CARE_PROVIDER_SITE_OTHER): Payer: BC Managed Care – PPO | Admitting: Internal Medicine

## 2012-01-15 ENCOUNTER — Encounter: Payer: Self-pay | Admitting: Internal Medicine

## 2012-01-15 VITALS — BP 138/82 | HR 51 | Temp 98.0°F | Wt 151.0 lb

## 2012-01-15 DIAGNOSIS — F419 Anxiety disorder, unspecified: Secondary | ICD-10-CM

## 2012-01-15 DIAGNOSIS — M1990 Unspecified osteoarthritis, unspecified site: Secondary | ICD-10-CM

## 2012-01-15 DIAGNOSIS — R21 Rash and other nonspecific skin eruption: Secondary | ICD-10-CM

## 2012-01-15 MED ORDER — HYDROCORTISONE 2.5 % EX CREA: TOPICAL_CREAM | Freq: Two times a day (BID) | CUTANEOUS | Status: DC

## 2012-01-15 MED ORDER — CYCLOBENZAPRINE HCL 10 MG PO TABS: 10.0000 mg | ORAL_TABLET | Freq: Every evening | ORAL | Status: DC | PRN

## 2012-01-15 NOTE — Progress Notes (Signed)
  Subjective:    Patient ID: Lori Goodwin, female    DOB: July 15, 1945, 66 y.o.   MRN: [id number]  HPI Followup visit. Was recently seen by Dr. Alwyn Ren with neck pain, did take gabapentin temporarily with mild decrease of her symptoms, however her pain is not resolved. Again the pain is at the back of the neck and at the occipital area, worse with hyperextension neck and when she turns to the sides. No radiation. Pain is definitely different than a migraine.   Past medical history Allergic rhinitis Migraines  Past surgical history Tympanostomy tube placement  Family History: colon ca--no breast ca--no DM--no MI--no  Social History: original from [place] Occupation:Teacher Divorced, [name] her son lives w/ her   3 children ,1 son is paraplegic , has another that is a MD,other a PA Never Smoked Alcohol use-no   Review of Systems She continue with a lot of stress. Denies upper or lower ext paresthesias No fever or chills No aches or myalgias    Objective:   Physical Exam  General -- alert, well-developed . No apparent distress.  Neck -- range of motion normal except for limited extension. Some pain elicited when she turns to the right. Cervical spine is nontender to palpation. Heart-- normal rate, regular rhythm, no murmur, and no gallop.   Extremities-- no pretibial edema bilaterally  Neurologic-- alert & oriented X3 ; face symmetric, gait normal, speech fluent, DTRs and strength normal in all extremities. Psych-- Cognition and judgment appear intact. Alert and cooperative with normal attention span and concentration.  not anxious appearing and not depressed appearing.      Assessment & Plan:  Declined a flu shot

## 2012-01-15 NOTE — Assessment & Plan Note (Signed)
Continue with a lot of stress in her life,  declines any treatment with medication

## 2012-01-15 NOTE — Patient Instructions (Addendum)
We are referring you to Dr. Newell Coral For pain, take Tylenol  500 mg OTC 2 tabs a day every 8 hours as needed for pain You can also take Flexeril as needed, at bedtime. This is a muscle relaxant, will make you sleepy.

## 2012-01-15 NOTE — Assessment & Plan Note (Addendum)
Persisting neck pain with mechanical features. The try gabapentin with modest results but does not like to take a lot of medicine. She has seen before by Dr.Nudelman, neurosurgery. Will refer back to him. In the meantime I offered her a steroid pack and Flexeril, she agreed to get Flexeril in case she needs it (side effects discussed ) See instructions.

## 2012-01-15 NOTE — Assessment & Plan Note (Signed)
Occasional rash for which she takes hydrocortisone when necessary

## 2012-03-26 ENCOUNTER — Emergency Department (HOSPITAL_COMMUNITY)
Admission: EM | Admit: 2012-03-26 | Discharge: 2012-03-26 | Disposition: A | Discharge status: Home / Self Care | Payer: BC Managed Care – PPO | Attending: Emergency Medicine | Admitting: Emergency Medicine

## 2012-03-26 ENCOUNTER — Emergency Department (HOSPITAL_COMMUNITY): Payer: BC Managed Care – PPO

## 2012-03-26 ENCOUNTER — Encounter (HOSPITAL_COMMUNITY): Payer: Self-pay | Admitting: *Deleted

## 2012-03-26 DIAGNOSIS — R413 Other amnesia: Secondary | ICD-10-CM | POA: Insufficient documentation

## 2012-03-26 DIAGNOSIS — I10 Essential (primary) hypertension: Secondary | ICD-10-CM | POA: Insufficient documentation

## 2012-03-26 DIAGNOSIS — Z79899 Other long term (current) drug therapy: Secondary | ICD-10-CM | POA: Insufficient documentation

## 2012-03-26 DIAGNOSIS — G43909 Migraine, unspecified, not intractable, without status migrainosus: Secondary | ICD-10-CM | POA: Insufficient documentation

## 2012-03-26 DIAGNOSIS — J309 Allergic rhinitis, unspecified: Secondary | ICD-10-CM | POA: Insufficient documentation

## 2012-03-26 HISTORY — DX: Essential (primary) hypertension: I10

## 2012-03-26 LAB — CBC WITH DIFFERENTIAL/PLATELET
Basophils Absolute: 0 10*3/uL (ref 0.0–0.1)
Basophils Relative: 1 % (ref 0–1)
Eosinophils Relative: 2 % (ref 0–5)
Lymphocytes Relative: 28 % (ref 12–46)
MCHC: 35.6 g/dL (ref 30.0–36.0)
MCV: 88.2 fL (ref 78.0–100.0)
Neutro Abs: 3.7 10*3/uL (ref 1.7–7.7)
Platelets: 211 10*3/uL (ref 150–400)
RDW: 11.8 % (ref 11.5–15.5)
WBC: 5.6 10*3/uL (ref 4.0–10.5)

## 2012-03-26 LAB — COMPREHENSIVE METABOLIC PANEL
ALT: 22 U/L (ref 0–35)
AST: 23 U/L (ref 0–37)
Albumin: 4.2 g/dL (ref 3.5–5.2)
CO2: 24 mEq/L (ref 19–32)
Calcium: 10.5 mg/dL (ref 8.4–10.5)
Creatinine, Ser: 0.66 mg/dL (ref 0.50–1.10)
GFR calc non Af Amer: >90 mL/min (ref >90)
Sodium: 140 mEq/L (ref 135–145)

## 2012-03-26 NOTE — ED Notes (Signed)
Pt sts she went shopping with her husband, sts she felt very tired and fell asleep, woke up and couldn't remember what she did today; a/ox4

## 2012-03-26 NOTE — ED Notes (Signed)
Bedside report received from previous RN 

## 2012-03-26 NOTE — ED Provider Notes (Signed)
History     CSN: [id number]  Arrival date & time 03/26/12  1627   First MD Initiated Contact with Patient 03/26/12 1701      Chief Complaint  Patient presents with  . Memory Loss    (Consider location/radiation/quality/duration/timing/severity/associated sxs/prior treatment) HPI Comments: The patient presents here with complaints of memory loss.  She reports going shopping this morning with her friend and shortly after returning home she was unable to recall what she had done.  She called her friend to ask him what had happened.  He told her that they had been shopping and she initially had no recollection of this.  She says that her memory is returning somewhat now.  She denies having hit her head.  There is no headache and no fevers or chills.  She denies drug or alcohol intake and denies having been started on any new medications.    The history is provided by the patient.    Past Medical History  Diagnosis Date  . Allergic rhinitis   . Common migraine   . Hypertension     Past Surgical History  Procedure Date  . Tympanostomy tube placement     Family History  Problem Relation Age of Onset  . Colon cancer Neg Hx   . Breast cancer Neg Hx   . Diabetes Neg Hx   . Heart attack Neg Hx     History  Substance Use Topics  . Smoking status: Never Smoker   . Smokeless tobacco: Not on file  . Alcohol Use: No    OB History    Grav Para Term Preterm Abortions TAB SAB Ect Mult Living                  Review of Systems  All other systems reviewed and are negative.    Allergies  Tylenol  Home Medications   Current Outpatient Rx  Name  Route  Sig  Dispense  Refill  . CYCLOBENZAPRINE HCL 10 MG PO TABS   Oral   Take 1 tablet (10 mg total) by mouth at bedtime as needed for muscle spasms.   21 tablet   0   . GABAPENTIN 100 MG PO CAPS      One pill every eight hours as needed; dose may be increased by one pill each dose after 72 hours if only partially  effective   30 capsule   2   . HYDROCORTISONE 2.5 % EX CREA   Topical   Apply topically 2 (two) times daily.   30 g   1   . PROPRANOLOL HCL ER 60 MG PO CP24      TAKE 1 CAPSULE BY MOUTH ONCE DAILY   90 capsule   1   . SUMATRIPTAN SUCCINATE 50 MG PO TABS      TAKE 1 TO 2 TABLETS BY MOUTH DAILY AS NEEDED FOR HEADACHES   12 tablet   1     BP 172/81  Pulse 90  Temp 97.9 F (36.6 C) (Oral)  Resp 17  Ht 5\' 2"  (1.575 m)  Wt 140 lb (63.504 kg)  BMI 25.61 kg/m2  SpO2 100%  Physical Exam  Nursing note and vitals reviewed. Constitutional: She is oriented to person, place, and time. She appears well-developed and well-nourished. No distress.  HENT:  Head: Normocephalic and atraumatic.  Eyes: EOM are normal. Pupils are equal, round, and reactive to light.  Neck: Normal range of motion. Neck supple.  Cardiovascular: Normal rate and regular  rhythm.  Exam reveals no gallop and no friction rub.   No murmur heard. Pulmonary/Chest: Effort normal and breath sounds normal. No respiratory distress. She has no wheezes.  Abdominal: Soft. Bowel sounds are normal. She exhibits no distension. There is no tenderness.  Musculoskeletal: Normal range of motion.  Neurological: She is alert and oriented to person, place, and time. No cranial nerve deficit. She exhibits normal muscle tone. Coordination normal.  Skin: Skin is warm and dry. She is not diaphoretic.    ED Course  Procedures (including critical care time)   Labs Reviewed  CBC WITH DIFFERENTIAL  COMPREHENSIVE METABOLIC PANEL   No results found.   No diagnosis found.    MDM  The patient presents here with memory loss of this morning's shopping trip.  She has no other symptoms and she is neurologically intact.  The workup reveals a negative ct scan and labs that are unremarkable.  She will be discharged to home, return prn.        Geoffery Lyons, MD 03/26/12 1919

## 2012-03-29 ENCOUNTER — Ambulatory Visit (INDEPENDENT_AMBULATORY_CARE_PROVIDER_SITE_OTHER): Payer: BC Managed Care – PPO | Admitting: Internal Medicine

## 2012-03-29 VITALS — BP 112/68 | HR 53 | Temp 98.1°F | Wt 151.0 lb

## 2012-03-29 DIAGNOSIS — M1990 Unspecified osteoarthritis, unspecified site: Secondary | ICD-10-CM

## 2012-03-29 DIAGNOSIS — R413 Other amnesia: Secondary | ICD-10-CM | POA: Insufficient documentation

## 2012-03-29 LAB — TSH: TSH: 1.14 u[IU]/mL (ref 0.35–5.50)

## 2012-03-29 LAB — FOLATE: Folate: >24.8 ng/mL (ref >5.9)

## 2012-03-29 LAB — VITAMIN B12: Vitamin B-12: 844 pg/mL (ref 211–911)

## 2012-03-29 NOTE — Assessment & Plan Note (Signed)
Ongoing neck pain, status post neurosurgical eval, MRI was done, I don't have the report however the patient was told that she had DJD and there was nothing surgical that could help her with. She continue with some pain, I offered her referral to a non  Interventional specialist for possible physical therapy and/or  a local injection but she declined. She will call me when ready.

## 2012-03-29 NOTE — Progress Notes (Signed)
  Subjective:    Patient ID: Lori Goodwin, female    DOB: 05-15-1945, 66 y.o.   MRN: [id number]  HPI ER. Went to the ER 03/26/2012 after she was unable to recall a shopping trip she had earlier. She was with a friend, she couldn't remember what she did that morning and she "kept asking" her friend what they did . Today, he is able to recall the shopping trip but have only minimal recollection of the ER visit, for instance she doesn't recall when they drew her blood.  Also, she continue with neck pain, see assessment and plan.   Past Medical History  Diagnosis Date  . Allergic rhinitis   . Common migraine   . Hypertension    Past Surgical History  Procedure Date  . Tympanostomy tube placement    Social History:  original from [place]  Occupation:Teacher  Divorced, [name] her son lives w/ her  3 children ,1 son is paraplegic , has another that is a MD,other a PA  Never Smoked  Alcohol use-no   Review of Systems At the time of amnesia, she did not have any sore speech, motor deficits, b/b incontinence. She does have migraines and take Imitrex 3 times a week, the morning of the ER eval she did take  Imitrex. She's not taking any new medications he specifically not takinggabapentin or nabumetone which was prescribed for neck pain a while back. Not taking Flexeril either.     Objective:   Physical Exam General -- alert, well-developed, and well-nourished.   Neck -- normal carotid pulses without bruit Lungs -- normal respiratory effort, no intercostal retractions, no accessory muscle use, and normal breath sounds.   Heart-- normal rate, regular rhythm, no murmur, and no gallop.    Extremities-- no pretibial edema bilaterally  ion. Neurologic-- alert & oriented X3 , speech, gait and motor are intact. Cranial nerves II - XII normal Psych-- Cognition and judgment appear intact. Alert and cooperative with normal attention span and concentration.   Not depressed appearing,  she is mild- moderately anxious which is her baseline    Assessment & Plan:

## 2012-03-29 NOTE — Assessment & Plan Note (Addendum)
Transient amnesia a few days ago , on clinical grounds, there is no evidence of a TIA, seizure. She's not taking any medication that may trigger amnesia. CT of the head was normal, electrolytes and CBC normal as well. I told the patient  her symptoms could be a primary problem versus an atypical migraine or related to anxiety as she has a very stressful life. I tried to reassure her but I think he needs more answers. Plan: B12, folic acid ,TSH Referred to neurology.

## 2012-03-31 ENCOUNTER — Encounter: Payer: Self-pay | Admitting: *Deleted

## 2012-03-31 ENCOUNTER — Encounter: Payer: Self-pay | Admitting: Internal Medicine

## 2012-06-03 ENCOUNTER — Telehealth: Payer: Self-pay | Admitting: *Deleted

## 2012-06-03 NOTE — Telephone Encounter (Signed)
Pt left msg on vmail stating that she has a runny nose & congestion. Pt is requesting a prescription be called in to pharmacy. Please advise.

## 2012-06-06 NOTE — Telephone Encounter (Signed)
lmovm for pt to return call.  

## 2012-06-06 NOTE — Telephone Encounter (Signed)
Send flonase 2 sprays on each side of the nose qd until better, #1, 1 RF mucinex DM prn OV  if no better

## 2012-06-07 NOTE — Telephone Encounter (Signed)
lmovm for pt to return call.  

## 2012-06-15 ENCOUNTER — Telehealth: Payer: Self-pay | Admitting: Internal Medicine

## 2012-06-15 NOTE — Telephone Encounter (Signed)
Patient Information:  Caller Name: Lori Goodwin  Phone: [phone]  Patient: Lori Goodwin, Lori Goodwin  Gender: Female  DOB: 09-10-1945  Age: 67 Years  PCP: Willow Ora  Office Follow Up:  Does the office need to follow up with this patient?: No  Instructions For The Office: N/A   Symptoms  Reason For Call & Symptoms: Allergy symptoms:  Itchy nose, sneezing, runny nose.  Started Clariton and this has helped these sx's.  Hears a cracking in the right ear with chewing, yawning.  No change in hearing.  Reviewed Health History In EMR: Yes  Reviewed Medications In EMR: Yes  Reviewed Allergies In EMR: Yes  Reviewed Surgeries / Procedures: Yes  Date of Onset of Symptoms: 06/10/2012  Treatments Tried: Clariton has helped except for the crackling in ear.  Treatments Tried Worked: No  Guideline(s) Used:  Ear - Congestion  Disposition Per Guideline:   Home Care  Reason For Disposition Reached:   Ear congestion  Advice Given:  N/A

## 2012-06-16 NOTE — Telephone Encounter (Signed)
noted 

## 2012-07-05 ENCOUNTER — Other Ambulatory Visit: Payer: Self-pay | Admitting: Internal Medicine

## 2012-07-05 NOTE — Telephone Encounter (Signed)
Refill done.  

## 2012-07-06 ENCOUNTER — Telehealth: Payer: Self-pay | Admitting: *Deleted

## 2012-07-06 NOTE — Telephone Encounter (Signed)
rec Flonase 2 sprays on each side of the nose qd #1, 6 RF OV if no better

## 2012-07-06 NOTE — Telephone Encounter (Signed)
Pt called stating that she has been sneezing & itchy/runny nose. Pt states she has been taking claritin OTC but has not been helping. Pt is requesting a med be sent into her pharmacy. Please advise.

## 2012-07-07 MED ORDER — FLUTICASONE PROPIONATE 50 MCG/ACT NA SUSP: 2.0000 | Freq: Every day | NASAL | Status: DC

## 2012-07-07 NOTE — Telephone Encounter (Signed)
Left detailed msg on pt's vmail. Refill done.

## 2012-09-17 ENCOUNTER — Other Ambulatory Visit: Payer: Self-pay | Admitting: Internal Medicine

## 2012-09-19 NOTE — Telephone Encounter (Signed)
Refill done.  

## 2012-11-03 ENCOUNTER — Ambulatory Visit (INDEPENDENT_AMBULATORY_CARE_PROVIDER_SITE_OTHER): Payer: BC Managed Care – PPO | Admitting: Internal Medicine

## 2012-11-03 ENCOUNTER — Encounter: Payer: Self-pay | Admitting: Internal Medicine

## 2012-11-03 VITALS — BP 130/82 | HR 53 | Temp 98.1°F | Wt 152.2 lb

## 2012-11-03 DIAGNOSIS — M1990 Unspecified osteoarthritis, unspecified site: Secondary | ICD-10-CM

## 2012-11-03 MED ORDER — CYCLOBENZAPRINE HCL 10 MG PO TABS: 10.0000 mg | ORAL_TABLET | Freq: Every evening | ORAL | Status: DC | PRN

## 2012-11-03 NOTE — Patient Instructions (Addendum)
Flexeril at night only Heating pad Call if not improving after physical therapy

## 2012-11-03 NOTE — Progress Notes (Signed)
  Subjective:    Patient ID: Lori Goodwin, female    DOB: [birth year], 67 y.o.   MRN: [id number]  HPI Acute visit Ongoing neck pain, mostly at night, at the cervical spine with some radiation to the nuchal area. Is worse with certain head motions, family suggest physical therapy and she is interested, would like a referral. Has been reluctant to take any medication  Past Medical History  Diagnosis Date  . Allergic rhinitis   . Common migraine   . Hypertension    Past Surgical History  Procedure Laterality Date  . Tympanostomy tube placement      Social History:  original from [place]  Occupation:Teacher  Divorced, [name] her son lives w/ her  3 children ,1 son is paraplegic , has another that is a MD,other a PA  Never Smoked  Alcohol use-no  Review of Systems Denies any radiation to the extremities, no paresthesias in the upper or lower extremities. Gait is not impaired. No bladder or bowel incontinence.     Objective:   Physical Exam General -- alert, well-developed, NAD Neck - Not tender to palpation of the cervical spine, range of motion normal except for some limitation when she attemps hyperextension Extremities-- no pretibial edema bilaterally  Neurologic-- alert & oriented X3; Speech, gait, DTRs and motor exam normal/symmetric. Psych-- Cognition and judgment appear intact. Alert and cooperative with normal attention span and concentration.  not anxious appearing and not depressed appearing.       Assessment & Plan:

## 2012-11-03 NOTE — Assessment & Plan Note (Signed)
Continue with neck pain, see previous entry. Plan: PT referral Flexeril Local heat. See instructions

## 2012-11-16 ENCOUNTER — Telehealth: Payer: Self-pay | Admitting: Internal Medicine

## 2012-11-16 NOTE — Telephone Encounter (Signed)
Please advise 

## 2012-11-16 NOTE — Telephone Encounter (Signed)
Patient called stating that she cannot do physical therapy because she has a $700 deductible. She would like to know if there is somewhere she can go that counts as an office visit so she can just pay her copay.

## 2012-11-16 NOTE — Telephone Encounter (Signed)
I dont' know of any place. This needs to go to the doctor.

## 2012-11-16 NOTE — Telephone Encounter (Signed)
I don't think so, sorry

## 2012-11-17 NOTE — Telephone Encounter (Signed)
Discussed with patient, verbalized understanding. Pt. Given # to [facility] Out[facility]. Physical therapy per pt. Request.

## 2012-12-28 ENCOUNTER — Encounter: Payer: Self-pay | Admitting: *Deleted

## 2012-12-28 ENCOUNTER — Encounter: Payer: Self-pay | Admitting: Internal Medicine

## 2012-12-28 LAB — HM MAMMOGRAPHY: HM Mammogram: NEGATIVE

## 2013-01-06 ENCOUNTER — Other Ambulatory Visit: Payer: Self-pay | Admitting: Internal Medicine

## 2013-01-06 NOTE — Telephone Encounter (Signed)
Imitrex refill sent to pharmacy

## 2013-01-14 ENCOUNTER — Other Ambulatory Visit: Payer: Self-pay | Admitting: Internal Medicine

## 2013-01-16 ENCOUNTER — Other Ambulatory Visit: Payer: Self-pay | Admitting: *Deleted

## 2013-01-16 MED ORDER — PROPRANOLOL HCL ER 60 MG PO CP24: 60.0000 mg | ORAL_CAPSULE | Freq: Every day | ORAL | Status: DC

## 2013-01-16 NOTE — Telephone Encounter (Signed)
Propranolol refill sent to pharmacy 

## 2013-02-14 ENCOUNTER — Encounter: Payer: Self-pay | Admitting: Internal Medicine

## 2013-03-18 ENCOUNTER — Other Ambulatory Visit: Payer: Self-pay | Admitting: Internal Medicine

## 2013-03-20 NOTE — Telephone Encounter (Signed)
rx refilled per protocol. DJR  

## 2013-03-25 ENCOUNTER — Encounter: Payer: Self-pay | Admitting: Family Medicine

## 2013-03-25 ENCOUNTER — Ambulatory Visit (INDEPENDENT_AMBULATORY_CARE_PROVIDER_SITE_OTHER): Payer: BC Managed Care – PPO | Admitting: Family Medicine

## 2013-03-25 VITALS — BP 130/78 | HR 62 | Temp 97.8°F | Resp 16 | Wt 155.8 lb

## 2013-03-25 DIAGNOSIS — J329 Chronic sinusitis, unspecified: Secondary | ICD-10-CM

## 2013-03-25 MED ORDER — PROMETHAZINE-DM 6.25-15 MG/5ML PO SYRP: 5.0000 mL | ORAL_SOLUTION | Freq: Four times a day (QID) | ORAL | Status: DC | PRN

## 2013-03-25 MED ORDER — AMOXICILLIN 875 MG PO TABS: 875.0000 mg | ORAL_TABLET | Freq: Two times a day (BID) | ORAL | Status: DC

## 2013-03-25 NOTE — Patient Instructions (Signed)
Follow up as needed Start the Amoxicillin twice daily- take w/ food Drink plenty of fluids REST! Use the cough syrup as needed- will cause drowsiness Add Mucinex to thin your congestion and drainage Call with any questions or concerns Happy Holidays!!!

## 2013-03-25 NOTE — Assessment & Plan Note (Signed)
New.  Pt's sxs and PE consistent w/ infxn.  Start abx.  Cough meds prn.  Reviewed supportive care and red flags that should prompt return.  Pt expressed understanding and is in agreement w/ plan.  

## 2013-03-25 NOTE — Progress Notes (Signed)
   Subjective:    Patient ID: Lori Goodwin, female    DOB: 1945-07-07, 67 y.o.   MRN: [id number]  HPI URI- sxs started Tuesday afternoon.  Alternating chills and sweats.  + cough- productive.  + nasal congestion.  + sinus pressure.  Bilateral ear fullness.  + sick contacts.  No N/V/D.  + anorexia.   Review of Systems For ROS see HPI     Objective:   Physical Exam  Vitals reviewed. Constitutional: She appears well-developed and well-nourished. No distress.  HENT:  Head: Normocephalic and atraumatic.  Right Ear: Tympanic membrane normal.  Left Ear: Tympanic membrane normal.  Nose: Mucosal edema and rhinorrhea present. Right sinus exhibits maxillary sinus tenderness and frontal sinus tenderness. Left sinus exhibits maxillary sinus tenderness and frontal sinus tenderness.  Mouth/Throat: Uvula is midline and mucous membranes are normal. Posterior oropharyngeal erythema present. No oropharyngeal exudate.  Eyes: Conjunctivae and EOM are normal. Pupils are equal, round, and reactive to light.  Neck: Normal range of motion. Neck supple.  Cardiovascular: Normal rate, regular rhythm and normal heart sounds.   Pulmonary/Chest: Effort normal and breath sounds normal. No respiratory distress. She has no wheezes.  Lymphadenopathy:    She has no cervical adenopathy.          Assessment & Plan:

## 2013-04-11 ENCOUNTER — Telehealth: Payer: Self-pay | Admitting: Internal Medicine

## 2013-04-11 NOTE — Telephone Encounter (Signed)
Recommend Mucinex DM OTC 1 tablet twice a day until better. If that has been tried and is not working let me know, we can use a syrup with hydrocodone

## 2013-04-11 NOTE — Telephone Encounter (Signed)
[facility] for a sinus infection and says that everything has gotten better accept for a cough she has. Wants to know if Dr. Larose Kells can rx or recommend something for it. Please advise.

## 2013-05-19 ENCOUNTER — Other Ambulatory Visit: Payer: Self-pay | Admitting: Internal Medicine

## 2013-06-29 ENCOUNTER — Other Ambulatory Visit: Payer: Self-pay | Admitting: Internal Medicine

## 2013-06-29 ENCOUNTER — Telehealth: Payer: Self-pay | Admitting: Internal Medicine

## 2013-06-29 DIAGNOSIS — F419 Anxiety disorder, unspecified: Secondary | ICD-10-CM

## 2013-06-29 DIAGNOSIS — F411 Generalized anxiety disorder: Secondary | ICD-10-CM

## 2013-06-29 NOTE — Telephone Encounter (Signed)
Done

## 2013-06-29 NOTE — Telephone Encounter (Signed)
Are you ok with referral? °

## 2013-06-29 NOTE — Telephone Encounter (Signed)
Patient would like a referral to see a psychiatrist. 

## 2013-06-29 NOTE — Telephone Encounter (Signed)
Please arrange, DX anxiety

## 2013-07-03 ENCOUNTER — Telehealth: Payer: Self-pay | Admitting: Internal Medicine

## 2013-07-03 NOTE — Telephone Encounter (Signed)
07/03/13 pt called in regards to a voice mail left regaring a referral.  Pt could not understand the voice mail and wants whoever called to call the cell phone [phone], not the house phone.

## 2013-07-24 ENCOUNTER — Other Ambulatory Visit: Payer: Self-pay | Admitting: Internal Medicine

## 2013-07-25 ENCOUNTER — Encounter: Payer: Self-pay | Admitting: Internal Medicine

## 2013-07-25 NOTE — Telephone Encounter (Signed)
Rx sent to the pharmacy by e-script.//AB/CMA 

## 2013-08-01 ENCOUNTER — Encounter: Payer: Self-pay | Admitting: Internal Medicine

## 2013-08-01 ENCOUNTER — Ambulatory Visit (INDEPENDENT_AMBULATORY_CARE_PROVIDER_SITE_OTHER): Payer: BC Managed Care – PPO | Admitting: Internal Medicine

## 2013-08-01 VITALS — BP 103/68 | HR 60 | Temp 98.0°F | Wt 161.0 lb

## 2013-08-01 DIAGNOSIS — R21 Rash and other nonspecific skin eruption: Secondary | ICD-10-CM

## 2013-08-01 DIAGNOSIS — M1990 Unspecified osteoarthritis, unspecified site: Secondary | ICD-10-CM

## 2013-08-01 NOTE — Assessment & Plan Note (Signed)
Thick, persisting rash at the nuchal area. Refer to dermatology

## 2013-08-01 NOTE — Progress Notes (Signed)
   Subjective:    Patient ID: Lori Goodwin, female    DOB: 07-13-1945, 68 y.o.   MRN: [id number]  DOS:  08/01/2013 Type of  visit: Acute visit, several concerns Persistent rash in the neck, occasional itching. DJD, continue with neck pain, needs a parking permit. 3 days ago had left ear pain, symptoms are now  gone.    ROS No recent URI, ear discharge or bleeding. Has an appointment pending to see a psychiatrist, mostly to help her son. She herself is not depressed, denies any suicidal ideas.  Past Medical History  Diagnosis Date  . Allergic rhinitis   . Common migraine   . Hypertension     Past Surgical History  Procedure Laterality Date  . Tympanostomy tube placement      History   Social History  . Marital Status: Married    Spouse Name: N/A    Number of Children: 3  . Years of Education: N/A   Occupational History  . [employer]   Social History Main Topics  . Smoking status: Never Smoker   . Smokeless tobacco: Never Used  . Alcohol Use: No  . Drug Use: Not on file  . Sexual Activity: Not on file   Other Topics Concern  . Not on file   Social History Narrative   Original from [place]   son lives w/ her             Medication List       This list is accurate as of: 08/01/13  5:29 PM.  Always use your most recent med list.               cyclobenzaprine 10 MG tablet  Commonly known as:  FLEXERIL  Take 1 tablet (10 mg total) by mouth at bedtime as needed (neck pain).     fluticasone 50 MCG/ACT nasal spray  Commonly known as:  FLONASE  Place 2 sprays into the nose daily.     multivitamin with minerals Tabs tablet  Take 1 tablet by mouth daily.     propranolol ER 60 MG 24 hr capsule  Commonly known as:  INDERAL LA  TAKE 1 CAPSULE BY MOUTH EVERY DAY     SUMAtriptan 50 MG tablet  Commonly known as:  IMITREX  TAKE 1 TO 2 TABLETS BY MOUTH AS NEEDED FOR HEADACHE AS DIRECTED           Objective:   Physical  Exam BP 103/68  Pulse 60  Temp(Src) 98 F (36.7 C)  Wt 161 lb (73.029 kg)  SpO2 94% General -- alert, well-developed, NAD.  HEENT-- Not pale. Dry and hard  wax in both ears, canal is now. Skin-- plaque of thick, silver color skin @ the nuchal area Neurologic--  alert & oriented X3.    Psych-- Cognition and judgment appear intact. Cooperative with normal attention span and concentration. No anxious or depressed appearing.       Assessment & Plan:   Cerumen impaction, recommend peroxide for 2 weeks, return to the office to see if this needed or possible to do a lavage

## 2013-08-01 NOTE — Assessment & Plan Note (Signed)
Ongoing issues with neck pain,requests a parking permit ---> provided

## 2013-08-01 NOTE — Patient Instructions (Addendum)
Use peroxide 3 - 4 drops on eache ear daily for 2 weeks  Come back and let us check the ears again  Also, please schedule a fasting physical exam at your convenience

## 2013-08-05 ENCOUNTER — Other Ambulatory Visit: Payer: Self-pay | Admitting: Internal Medicine

## 2013-08-25 ENCOUNTER — Ambulatory Visit (INDEPENDENT_AMBULATORY_CARE_PROVIDER_SITE_OTHER): Payer: BC Managed Care – PPO | Admitting: Internal Medicine

## 2013-08-25 VITALS — BP 138/62 | HR 64 | Temp 98.0°F | Wt 161.0 lb

## 2013-08-25 DIAGNOSIS — H6120 Impacted cerumen, unspecified ear: Secondary | ICD-10-CM

## 2013-08-25 NOTE — Progress Notes (Signed)
   Subjective:    Patient ID: Lori Goodwin, female    DOB: Apr 21, 1945, 68 y.o.   MRN: [id number]  DOS:  08/25/2013 Type of  visit: Acute visit  Cerumen impaction  ROS No pain or ear d/c. Some pruritus   Past Medical History  Diagnosis Date  . Allergic rhinitis   . Common migraine   . Hypertension     Past Surgical History  Procedure Laterality Date  . Tympanostomy tube placement      History   Social History  . Marital Status: Married    Spouse Name: N/A    Number of Children: 3  . Years of Education: N/A   Occupational History  . [employer]   Social History Main Topics  . Smoking status: Never Smoker   . Smokeless tobacco: Never Used  . Alcohol Use: No  . Drug Use: Not on file  . Sexual Activity: Not on file   Other Topics Concern  . Not on file   Social History Narrative   [place] from [place]   son lives w/ her             Medication List       This list is accurate as of: 08/25/13 11:59 PM.  Always use your most recent med list.               clobetasol 0.05 % external solution  Commonly known as:  TEMOVATE     cyclobenzaprine 10 MG tablet  Commonly known as:  FLEXERIL  Take 1 tablet (10 mg total) by mouth at bedtime as needed (neck pain).     fluticasone 50 MCG/ACT nasal spray  Commonly known as:  FLONASE  Place 2 sprays into the nose daily.     multivitamin with minerals Tabs tablet  Take 1 tablet by mouth daily.     propranolol ER 60 MG 24 hr capsule  Commonly known as:  INDERAL LA  TAKE 1 CAPSULE BY MOUTH EVERY DAY     SUMAtriptan 50 MG tablet  Commonly known as:  IMITREX  TAKE 1 TO 2 TABLETS BY MOUTH AS NEEDED FOR HEADACHE AS DIRECTED           Objective:   Physical Exam BP 138/62  Pulse 64  Temp(Src) 98 F (36.7 C) (Oral)  Wt 161 lb (73.029 kg)  SpO2 98%  General -- alert, well-developed, NAD.  B ear impaction Wax removed from the L w/ a spoon and lavaged, TM normal, mild bleeding  at the canal  R-- unable to remove w/ forceps or spoon Psych-- Cognition and judgment appear intact. Cooperative with normal attention span and concentration. No anxious or depressed appearing.       Assessment & Plan:    Cerumen impaction,    left cleared, partially removed on the right Recommend peroxide daily for 2 weeks, then come back, we'll attempt to clear the right side. She did have some bleeding on the left, will call if pain, swelling or discharge

## 2013-08-25 NOTE — Progress Notes (Signed)
Pre-visit discussion using our clinic review tool. No additional management support is needed unless otherwise documented below in the visit note.  

## 2013-09-04 ENCOUNTER — Encounter: Payer: Self-pay | Admitting: Internal Medicine

## 2013-09-04 ENCOUNTER — Ambulatory Visit (INDEPENDENT_AMBULATORY_CARE_PROVIDER_SITE_OTHER): Payer: BC Managed Care – PPO | Admitting: Internal Medicine

## 2013-09-04 VITALS — BP 111/60 | HR 60 | Temp 98.7°F | Wt 162.0 lb

## 2013-09-04 DIAGNOSIS — H6120 Impacted cerumen, unspecified ear: Secondary | ICD-10-CM

## 2013-09-04 NOTE — Progress Notes (Signed)
   Subjective:    Patient ID: Lori Goodwin, female    DOB: 1946/03/22, 68 y.o.   MRN: [id number]  DOS:  09/04/2013 Type of  Visit: Followup from previous visit History: Since the last time she was here, she is using peroxide on the ears. Right ear still feels clogged  ROS   Past Medical History  Diagnosis Date  . Allergic rhinitis   . Common migraine   . Hypertension     Past Surgical History  Procedure Laterality Date  . Tympanostomy tube placement      History   Social History  . Marital Status: Married    Spouse Name: N/A    Number of Children: 3  . Years of Education: N/A   Occupational History  . [employer]   Social History Main Topics  . Smoking status: Never Smoker   . Smokeless tobacco: Never Used  . Alcohol Use: No  . Drug Use: Not on file  . Sexual Activity: Not on file   Other Topics Concern  . Not on file   Social History Narrative   [place] from [place]   son lives w/ her             Medication List       This list is accurate as of: 09/04/13  1:22 PM.  Always use your most recent med list.               clobetasol 0.05 % external solution  Commonly known as:  TEMOVATE     cyclobenzaprine 10 MG tablet  Commonly known as:  FLEXERIL  Take 1 tablet (10 mg total) by mouth at bedtime as needed (neck pain).     fluticasone 50 MCG/ACT nasal spray  Commonly known as:  FLONASE  Place 2 sprays into the nose daily.     multivitamin with minerals Tabs tablet  Take 1 tablet by mouth daily.     propranolol ER 60 MG 24 hr capsule  Commonly known as:  INDERAL LA  TAKE 1 CAPSULE BY MOUTH EVERY DAY     SUMAtriptan 50 MG tablet  Commonly known as:  IMITREX  TAKE 1 TO 2 TABLETS BY MOUTH AS NEEDED FOR HEADACHE AS DIRECTED           Objective:   Physical Exam BP 111/60  Pulse 60  Temp(Src) 98.7 F (37.1 C)  Wt 162 lb (73.483 kg)  SpO2 99%  General -- alert, well-developed, NAD.    HEENT-- L ear--  wnl R ear-- impacted, partially removed w/ a spoon Psych-- Cognition and judgment appear intact. Cooperative with normal attention span and concentration. No anxious or depressed appearing.       Assessment & Plan:   Cerumen impaction Right ear was lavaged, post lavage exam is essentially normal. She did have some bleeding from the spoon. Recommend peroxide twice a week and call if needed

## 2013-09-04 NOTE — Progress Notes (Signed)
Pre visit review using our clinic review tool, if applicable. No additional management support is needed unless otherwise documented below in the visit note. 

## 2013-10-16 ENCOUNTER — Other Ambulatory Visit: Payer: Self-pay | Admitting: Internal Medicine

## 2013-12-01 ENCOUNTER — Other Ambulatory Visit: Payer: Self-pay | Admitting: Internal Medicine

## 2014-01-24 ENCOUNTER — Other Ambulatory Visit: Payer: Self-pay | Admitting: Internal Medicine

## 2014-02-19 ENCOUNTER — Encounter: Payer: Self-pay | Admitting: Internal Medicine

## 2014-02-19 ENCOUNTER — Ambulatory Visit (INDEPENDENT_AMBULATORY_CARE_PROVIDER_SITE_OTHER): Payer: BC Managed Care – PPO | Admitting: Internal Medicine

## 2014-02-19 VITALS — BP 132/68 | HR 49 | Temp 97.9°F | Ht 62.0 in | Wt 156.0 lb

## 2014-02-19 DIAGNOSIS — Z23 Encounter for immunization: Secondary | ICD-10-CM

## 2014-02-19 DIAGNOSIS — R21 Rash and other nonspecific skin eruption: Secondary | ICD-10-CM

## 2014-02-19 DIAGNOSIS — Z0000 Encounter for general adult medical examination without abnormal findings: Secondary | ICD-10-CM

## 2014-02-19 NOTE — Patient Instructions (Signed)
Stop by the front desk and schedule labs to be done within few days (fasting)  Check the  blood pressure monthly  Be sure your blood pressure is between  145/85  and 110/65.  if it is consistently higher or lower, let me know    Please come back to the office in 1 year  for a physical exam. Come back fasting

## 2014-02-19 NOTE — Progress Notes (Signed)
Subjective:    Patient ID: Lori Goodwin, female    DOB: 14-Apr-1945, 68 y.o.   MRN: [id number]  DOS:  02/19/2014 Type of visit - description : cpx Interval history: in general feels well   ROS No  CP, SOB Denies  nausea, vomiting diarrhea, blood in the stools (-) cough, sputum production (-) wheezing, chest congestion No dysuria, gross hematuria, difficulty urinating  No anxiety, depression    Past Medical History  Diagnosis Date  . Allergic rhinitis   . Common migraine   . Hypertension     Past Surgical History  Procedure Laterality Date  . Tympanostomy tube placement      History   Social History  . Marital Status: Married    Spouse Name: N/A    Number of Children: 3  . Years of Education: N/A   Occupational History  . [employer]   Social History Main Topics  . Smoking status: Never Smoker   . Smokeless tobacco: Never Used  . Alcohol Use: No  . Drug Use: Not on file  . Sexual Activity: Not on file   Other Topics Concern  . Not on file   Social History Narrative   Original from [place]   son lives w/ her          Family History  Problem Relation Age of Onset  . Colon cancer Neg Hx   . Breast cancer Neg Hx   . Diabetes Neg Hx   . Heart attack Neg Hx        Medication List       This list is accurate as of: 02/19/14  8:16 AM.  Always use your most recent med list.               clobetasol 0.05 % external solution  Commonly known as:  TEMOVATE     cyclobenzaprine 10 MG tablet  Commonly known as:  FLEXERIL  Take 1 tablet (10 mg total) by mouth at bedtime as needed (neck pain).     fluticasone 50 MCG/ACT nasal spray  Commonly known as:  FLONASE  Place 2 sprays into the nose daily.     multivitamin with minerals Tabs tablet  Take 1 tablet by mouth daily.     propranolol ER 60 MG 24 hr capsule  Commonly known as:  INDERAL LA  Take 1 tablet daily. NEEDS APPT WITH DR PAZ FOR ANY FURTHER REFILLS  [phone].     SUMAtriptan 50 MG tablet  Commonly known as:  IMITREX  TAKE 1 TO 2 TABLETS BY MOUTH AS NEEDED FOR HEADACHE AS DIRECTED           Objective:   Physical Exam BP 160/93 mmHg  Pulse 49  Temp(Src) 97.9 F (36.6 C) (Oral)  Ht 5\' 2"  (1.575 m)  Wt 156 lb (70.761 kg)  BMI 28.53 kg/m2  SpO2 99%  General -- alert, well-developed, NAD.  Neck --no thyromegaly  HEENT-- Not pale.   Lungs -- normal respiratory effort, no intercostal retractions, no accessory muscle use, and normal breath sounds.  Heart-- normal rate, regular rhythm, no murmur.  Abdomen-- Not distended, good bowel sounds,soft, non-tender. No rebound or rigidity.  Extremities-- no pretibial edema bilaterally  Neurologic--  alert & oriented X3. Speech normal, gait appropriate for age, strength symmetric and appropriate for age.    Psych-- Cognition and judgment appear intact. Cooperative with normal attention span and concentration. No anxious or depressed appearing.  Assessment & Plan:

## 2014-02-19 NOTE — Assessment & Plan Note (Addendum)
Td 2011 Flu shot--declined, states she had a reaction before Pneumonia shot provided today zostavax discussed  Female care at [facility] Reports a mammogram 12-2013 Menopause, never had a bone density test, for now rec  To continue calcium and vitamin D supplements  Colonoscopy screening, reports previous negative Hemoccults at gynecology, she likes to a colonoscopy and will call me when ready.  Other problems: BP slightly elevated, recheck and is 132/68. Recommend to check BPs from time to time Migraine headaches currently well controlled, needs Imitrex one or twice a month which is good control for the patient.   Labs  Continue with healthy lifestyle, reports she exercises 4 times a week and eat healthy

## 2014-02-19 NOTE — Progress Notes (Signed)
Pre visit review using our clinic review tool, if applicable. No additional management support is needed unless otherwise documented below in the visit note. 

## 2014-02-19 NOTE — Assessment & Plan Note (Signed)
Saw dermatology, rash resolved

## 2014-02-23 ENCOUNTER — Other Ambulatory Visit (INDEPENDENT_AMBULATORY_CARE_PROVIDER_SITE_OTHER): Payer: BC Managed Care – PPO

## 2014-02-23 DIAGNOSIS — Z0000 Encounter for general adult medical examination without abnormal findings: Secondary | ICD-10-CM

## 2014-02-23 LAB — LIPID PANEL
CHOLESTEROL: 172 mg/dL (ref 0–200)
HDL: 51.1 mg/dL (ref >39.00)
LDL Cholesterol: 108 mg/dL — ABNORMAL HIGH (ref 0–99)
NonHDL: 120.9
Total CHOL/HDL Ratio: 3
Triglycerides: 63 mg/dL (ref 0.0–149.0)
VLDL: 12.6 mg/dL (ref 0.0–40.0)

## 2014-02-23 LAB — CBC WITH DIFFERENTIAL/PLATELET
Basophils Absolute: 0 10*3/uL (ref 0.0–0.1)
Basophils Relative: 0.5 % (ref 0.0–3.0)
EOS PCT: 2.9 % (ref 0.0–5.0)
Eosinophils Absolute: 0.2 10*3/uL (ref 0.0–0.7)
HCT: 39.3 % (ref 36.0–46.0)
Hemoglobin: 13.3 g/dL (ref 12.0–15.0)
LYMPHS PCT: 24.3 % (ref 12.0–46.0)
Lymphs Abs: 1.7 10*3/uL (ref 0.7–4.0)
MCHC: 33.8 g/dL (ref 30.0–36.0)
MCV: 92.1 fl (ref 78.0–100.0)
MONO ABS: 0.4 10*3/uL (ref 0.1–1.0)
MONOS PCT: 5.5 % (ref 3.0–12.0)
NEUTROS PCT: 66.8 % (ref 43.0–77.0)
Neutro Abs: 4.7 10*3/uL (ref 1.4–7.7)
PLATELETS: 209 10*3/uL (ref 150.0–400.0)
RBC: 4.26 Mil/uL (ref 3.87–5.11)
RDW: 12.4 % (ref 11.5–15.5)
WBC: 7 10*3/uL (ref 4.0–10.5)

## 2014-02-23 LAB — COMPREHENSIVE METABOLIC PANEL
ALBUMIN: 4.3 g/dL (ref 3.5–5.2)
ALT: 18 U/L (ref 0–35)
AST: 20 U/L (ref 0–37)
Alkaline Phosphatase: 73 U/L (ref 39–117)
BUN: 19 mg/dL (ref 6–23)
CALCIUM: 9.3 mg/dL (ref 8.4–10.5)
CHLORIDE: 105 meq/L (ref 96–112)
CO2: 27 meq/L (ref 19–32)
Creatinine, Ser: 0.7 mg/dL (ref 0.4–1.2)
GFR: 94.79 mL/min (ref >60.00)
GLUCOSE: 103 mg/dL — AB (ref 70–99)
POTASSIUM: 3.8 meq/L (ref 3.5–5.1)
SODIUM: 137 meq/L (ref 135–145)
TOTAL PROTEIN: 7 g/dL (ref 6.0–8.3)
Total Bilirubin: 0.9 mg/dL (ref 0.2–1.2)

## 2014-02-23 LAB — TSH: TSH: 1.49 u[IU]/mL (ref 0.35–4.50)

## 2014-02-26 ENCOUNTER — Telehealth: Payer: Self-pay | Admitting: Internal Medicine

## 2014-02-26 ENCOUNTER — Other Ambulatory Visit: Payer: Self-pay | Admitting: Internal Medicine

## 2014-02-26 NOTE — Telephone Encounter (Signed)
Spoke with Pt, gave her Dr. Larose Kells recommendations. Informed her that all labs were normal and to let us know if she is not better in a few days. Pt verbalized understanding.

## 2014-02-26 NOTE — Telephone Encounter (Signed)
Please advise 

## 2014-02-26 NOTE — Telephone Encounter (Signed)
Had a PNM shot few days ago: rec rest-fluis-tylenol-mucinex DM  Call if not gradually better in the next few days Also-- all her labs were ok, good results

## 2014-02-26 NOTE — Telephone Encounter (Signed)
Caller name: Tanja, Gift Relation to pt: self  Call back [phone] [facility]acy: [facility] [phone]  Reason for call:  Pt states every since she received the phenomena vaccination she has been feeling sick, sore throat, coughing. Pt in need of clinical advice regarding a OTC. Please advise

## 2014-04-04 ENCOUNTER — Encounter: Payer: Self-pay | Admitting: Physician Assistant

## 2014-04-04 ENCOUNTER — Ambulatory Visit (INDEPENDENT_AMBULATORY_CARE_PROVIDER_SITE_OTHER): Payer: BC Managed Care – PPO | Admitting: Physician Assistant

## 2014-04-04 VITALS — BP 140/78 | HR 58 | Temp 98.2°F | Resp 16 | Ht 62.0 in | Wt 154.0 lb

## 2014-04-04 DIAGNOSIS — H6123 Impacted cerumen, bilateral: Secondary | ICD-10-CM

## 2014-04-04 DIAGNOSIS — H6983 Other specified disorders of Eustachian tube, bilateral: Secondary | ICD-10-CM

## 2014-04-04 MED ORDER — FLUTICASONE PROPIONATE 50 MCG/ACT NA SUSP: 2.0000 | Freq: Every day | NASAL | Status: DC

## 2014-04-04 NOTE — Progress Notes (Signed)
Pre visit review using our clinic review tool, if applicable. No additional management support is needed unless otherwise documented below in the visit note/SLS  

## 2014-04-04 NOTE — Progress Notes (Signed)
Patient presents to clinic today c/o itching and fullness of ears bilaterally.  Denies ear pain, tinnitus.  Has noted some mild decreased hearing bilaterally.  Patient denies hx of loud-noise exposure. Does have significant history of cerumen impaction.  Past Medical History  Diagnosis Date  . Allergic rhinitis   . Common migraine   . Hypertension     Current Outpatient Prescriptions on File Prior to Visit  Medication Sig Dispense Refill  . clobetasol (TEMOVATE) 0.05 % external solution     . Multiple Vitamin (MULTIVITAMIN WITH MINERALS) TABS Take 1 tablet by mouth daily.    . propranolol ER (INDERAL LA) 60 MG 24 hr capsule Take 1 tablet daily. NEEDS APPT WITH DR PAZ FOR ANY FURTHER REFILLS [phone]. (Patient taking differently: Take 60 mg by mouth daily. ) 90 capsule 0  . SUMAtriptan (IMITREX) 50 MG tablet TAKE 1 TO 2 TABLETS BY MOUTH AS NEEDED FOR HEADACHE AS DIRECTED 12 tablet 0   No current facility-administered medications on file prior to visit.    Allergies  Allergen Reactions  . Tylenol [Acetaminophen] Shortness Of Breath    Family History  Problem Relation Age of Onset  . Colon cancer Neg Hx   . Breast cancer Neg Hx   . Diabetes Neg Hx   . Heart attack Neg Hx     History   Social History  . Marital Status: Married    Spouse Name: N/A    Number of Children: 3  . Years of Education: N/A   Occupational History  . [employer]   Social History Main Topics  . Smoking status: Never Smoker   . Smokeless tobacco: Never Used  . Alcohol Use: No  . Drug Use: None  . Sexual Activity: None   Other Topics Concern  . None   Social History Narrative   [place] from [place]   Divorced, [place] her son used to  lives w/ her     3 children ,1 son is paraplegic , has another that is a MD,other a PA      Review of Systems - See HPI.  All other ROS are negative.  BP 140/78 mmHg  Pulse 58  Temp(Src) 98.2 F (36.8 C) (Oral)  Resp 16  Ht 5'  2" (1.575 m)  Wt 154 lb (69.854 kg)  BMI 28.16 kg/m2  SpO2 100%  Physical Exam  Constitutional: She is oriented to person, place, and time and well-developed, well-nourished, and in no distress.  HENT:  Head: Normocephalic and atraumatic.  Right Ear: Tympanic membrane and ear canal normal.  Left Ear: Tympanic membrane and ear canal normal.  Nose: Nose normal.  Mouth/Throat: Oropharynx is clear and moist. No oropharyngeal exudate.  On initial inspection, TM are obscured bilaterally due to cerumen impaction.  Upon reexamination after cerumen removal, canal and TM within normal limits.  Eyes: Conjunctivae are normal.  Neck: Neck supple. No thyromegaly present.  Cardiovascular: Normal rate, regular rhythm, normal heart sounds and intact distal pulses.   Pulmonary/Chest: Effort normal and breath sounds normal. No respiratory distress. She has no wheezes. She has no rales. She exhibits no tenderness.  Lymphadenopathy:    She has no cervical adenopathy.  Neurological: She is alert and oriented to person, place, and time.  Skin: Skin is warm and dry. No rash noted.  Psychiatric: Affect normal.  Vitals reviewed.   Recent Results (from the past 2160 hour(s))  Comprehensive metabolic panel     Status: Abnormal  Collection Time: 02/23/14  8:32 AM  Result Value Ref Range   Sodium 137 135 - 145 mEq/L   Potassium 3.8 3.5 - 5.1 mEq/L   Chloride 105 96 - 112 mEq/L   CO2 27 19 - 32 mEq/L   Glucose, Bld 103 (H) 70 - 99 mg/dL   BUN 19 6 - 23 mg/dL   Creatinine, Ser 0.7 0.4 - 1.2 mg/dL   Total Bilirubin 0.9 0.2 - 1.2 mg/dL   Alkaline Phosphatase 73 39 - 117 U/L   AST 20 0 - 37 U/L   ALT 18 0 - 35 U/L   Total Protein 7.0 6.0 - 8.3 g/dL   Albumin 4.3 3.5 - 5.2 g/dL   Calcium 9.3 8.4 - 10.5 mg/dL   GFR 94.79 >60.00 mL/min  CBC with Differential     Status: None   Collection Time: 02/23/14  8:32 AM  Result Value Ref Range   WBC 7.0 4.0 - 10.5 K/uL   RBC 4.26 3.87 - 5.11 Mil/uL   Hemoglobin  13.3 12.0 - 15.0 g/dL   HCT 39.3 36.0 - 46.0 %   MCV 92.1 78.0 - 100.0 fl   MCHC 33.8 30.0 - 36.0 g/dL   RDW 12.4 11.5 - 15.5 %   Platelets 209.0 150.0 - 400.0 K/uL   Neutrophils Relative % 66.8 43.0 - 77.0 %   Lymphocytes Relative 24.3 12.0 - 46.0 %   Monocytes Relative 5.5 3.0 - 12.0 %   Eosinophils Relative 2.9 0.0 - 5.0 %   Basophils Relative 0.5 0.0 - 3.0 %   Neutro Abs 4.7 1.4 - 7.7 K/uL   Lymphs Abs 1.7 0.7 - 4.0 K/uL   Monocytes Absolute 0.4 0.1 - 1.0 K/uL   Eosinophils Absolute 0.2 0.0 - 0.7 K/uL   Basophils Absolute 0.0 0.0 - 0.1 K/uL  TSH     Status: None   Collection Time: 02/23/14  8:32 AM  Result Value Ref Range   TSH 1.49 0.35 - 4.50 uIU/mL  Lipid panel     Status: Abnormal   Collection Time: 02/23/14  8:32 AM  Result Value Ref Range   Cholesterol 172 0 - 200 mg/dL    Comment: ATP III Classification       Desirable:  < 200 mg/dL               Borderline High:  200 - 239 mg/dL          High:  > = 240 mg/dL   Triglycerides 63.0 0.0 - 149.0 mg/dL    Comment: Normal:  <150 mg/dLBorderline High:  150 - 199 mg/dL   HDL 51.10 >39.00 mg/dL   VLDL 12.6 0.0 - 40.0 mg/dL   LDL Cholesterol 108 (H) 0 - 99 mg/dL   Total CHOL/HDL Ratio 3     Comment:                Men          Women1/2 Average Risk     3.4          3.3Average Risk          5.0          4.42X Average Risk          9.6          7.13X Average Risk          15.0          11.0  NonHDL 120.90     Comment: NOTE:  Non-HDL goal should be 30 mg/dL higher than patient's LDL goal (i.e. LDL goal of < 70 mg/dL, would have non-HDL goal of < 100 mg/dL)    Assessment/Plan: Cerumen impaction Removed successfully via irrigation.  Supportive measures discussed with patient.  Eustachian tube dysfunction Patient endorses "popping" of ears.  Suspect ETD as cause.  Rx Flonase.  Encouraged daily claritin to also help.

## 2014-04-04 NOTE — Patient Instructions (Signed)
Please start taking the Flonase daily.  Also take a daily claritin to help remove fluid and pressure behind your ear.  Stay well hydrated.  Every once in a while, use some hydrogen peroxide to rinse out ears and remove wax debris.  You can do this a couple of times per month.  Call or return to clinic if symptoms are not improving.

## 2014-04-08 DIAGNOSIS — H6980 Other specified disorders of Eustachian tube, unspecified ear: Secondary | ICD-10-CM | POA: Insufficient documentation

## 2014-04-08 NOTE — Assessment & Plan Note (Signed)
Removed successfully via irrigation.  Supportive measures discussed with patient.

## 2014-04-08 NOTE — Assessment & Plan Note (Signed)
Patient endorses "popping" of ears.  Suspect ETD as cause.  Rx Flonase.  Encouraged daily claritin to also help.

## 2014-04-09 ENCOUNTER — Ambulatory Visit (INDEPENDENT_AMBULATORY_CARE_PROVIDER_SITE_OTHER): Payer: BC Managed Care – PPO | Admitting: Medical

## 2014-04-09 ENCOUNTER — Encounter: Payer: Self-pay | Admitting: Medical

## 2014-04-09 VITALS — BP 150/80 | HR 66 | Temp 98.1°F | Ht 62.0 in | Wt 154.0 lb

## 2014-04-09 DIAGNOSIS — J0100 Acute maxillary sinusitis, unspecified: Secondary | ICD-10-CM

## 2014-04-09 DIAGNOSIS — J322 Chronic ethmoidal sinusitis: Secondary | ICD-10-CM | POA: Insufficient documentation

## 2014-04-09 MED ORDER — CEFDINIR 300 MG PO CAPS: 300.0000 mg | ORAL_CAPSULE | Freq: Two times a day (BID) | ORAL | Status: DC

## 2014-04-09 MED ORDER — BENZONATATE 200 MG PO CAPS: 200.0000 mg | ORAL_CAPSULE | Freq: Three times a day (TID) | ORAL | Status: DC | PRN

## 2014-04-09 NOTE — Progress Notes (Signed)
Pre visit review using our clinic review tool, if applicable. No additional management support is needed unless otherwise documented below in the visit note. 

## 2014-04-09 NOTE — Assessment & Plan Note (Signed)
Your appear to have a sinus infection. I am prescribing cefdinir  antibiotic for the infection. To help with the nasal congestion use your flonase  nasal steroid. For your associated cough, I prescribed cough medicine benzonatate.

## 2014-04-09 NOTE — Patient Instructions (Addendum)
Your appear to have a sinus infection(possible bronchitis). I am prescribing cefdinir  antibiotic for the infection. To help with the nasal congestion use your flonase  nasal steroid. For your associated cough, I prescribed cough medicine benzonatate.  Rest, hydrate, advil  for fever.  Follow up in 7 days or as needed.  I advised pt to check her blood pressure daily. Get bp cuff and check daily. BP appointment in 2 wks.

## 2014-04-09 NOTE — Progress Notes (Signed)
Subjective:    Patient ID: Lori Goodwin, female    DOB: Dec 24, 1945, 69 y.o.   MRN: [id number]  HPI   Pt in today reporting  cough, nasal congestion and runny nose for 5 Days. Some sinus pressure. Some productive cough.  Associated symptoms( below yes or no)  Fever-yes subjective. Chills-yes Chest congestion- no Sneezing- no Itching eyes-no Sore throat- yes. Mild faint sore. Post-nasal drainage-yes Wheezing-no Purulent nasal drainage-some Fatigue-no  Pt has been taking claritin.  Past Medical History  Diagnosis Date  . Allergic rhinitis   . Common migraine   . Hypertension     History   Social History  . Marital Status: Married    Spouse Name: N/A    Number of Children: 3  . Years of Education: N/A   Occupational History  . [employer]   Social History Main Topics  . Smoking status: Never Smoker   . Smokeless tobacco: Never Used  . Alcohol Use: No  . Drug Use: Not on file  . Sexual Activity: Not on file   Other Topics Concern  . Not on file   Social History Narrative   [place]   Divorced, [name] her son used to  lives w/ her     3 children ,1 son is paraplegic , has another that is a MD,other a PA      Past Surgical History  Procedure Laterality Date  . Tympanostomy tube placement      Family History  Problem Relation Age of Onset  . Colon cancer Neg Hx   . Breast cancer Neg Hx   . Diabetes Neg Hx   . Heart attack Neg Hx     Allergies  Allergen Reactions  . Tylenol [Acetaminophen] Shortness Of Breath    Current Outpatient Prescriptions on File Prior to Visit  Medication Sig Dispense Refill  . clobetasol (TEMOVATE) 0.05 % external solution     . fluticasone (FLONASE) 50 MCG/ACT nasal spray Place 2 sprays into both nostrils daily. 16 g 6  . Multiple Vitamin (MULTIVITAMIN WITH MINERALS) TABS Take 1 tablet by mouth daily.    . propranolol ER (INDERAL LA) 60 MG 24 hr capsule Take 1 tablet daily.  NEEDS APPT WITH DR PAZ FOR ANY FURTHER REFILLS [phone]. (Patient taking differently: Take 60 mg by mouth daily. ) 90 capsule 0  . SUMAtriptan (IMITREX) 50 MG tablet TAKE 1 TO 2 TABLETS BY MOUTH AS NEEDED FOR HEADACHE AS DIRECTED 12 tablet 0   No current facility-administered medications on file prior to visit.    BP 171/80 mmHg  Pulse 66  Temp(Src) 98.1 F (36.7 C) (Oral)  Ht 5\' 2"  (1.575 m)  Wt 154 lb (69.854 kg)  BMI 28.16 kg/m2  SpO2 97%       Review of Systems  Constitutional: Positive for fever and chills. Negative for fatigue.  HENT: Positive for congestion, postnasal drip, rhinorrhea and sinus pressure. Negative for ear discharge, nosebleeds, sore throat and trouble swallowing.   Respiratory: Positive for cough. Negative for shortness of breath and wheezing.        Productive.  Cardiovascular: Negative for chest pain and palpitations.  Gastrointestinal: Negative for abdominal pain.  Musculoskeletal: Negative for back pain.  Neurological: Negative for headaches.  Hematological: Does not bruise/bleed easily.       Objective:   Physical Exam   General  Mental Status - Alert. General Appearance - Well groomed. Not in acute distress.  Skin Rashes- No  Rashes.  HEENT Head- Normal. Ear Auditory Canal - Left- Normal. Right - Normal.Tympanic Membrane- Left- Normal. Right- Normal. Eye Sclera/Conjunctiva- Left- Normal. Right- Normal. Nose & Sinuses Nasal Mucosa- Left-  Boggy and Congested. Right-  Boggy and  Congested.Rt side  maxillary and rt side frontal sinus pressure. Mouth & Throat Lips: Upper Lip- Normal: no dryness, cracking, pallor, cyanosis, or vesicular eruption. Lower Lip-Normal: no dryness, cracking, pallor, cyanosis or vesicular eruption. Buccal Mucosa- Bilateral- No Aphthous ulcers. Oropharynx- No Discharge or Erythema. Tonsils: Characteristics- Bilateral- No Erythema or Congestion. Size/Enlargement- Bilateral- No enlargement. Discharge-  bilateral-None.  Neck Neck- Supple. No Masses.   Chest and Lung Exam Auscultation: Breath Sounds:-Clear even and unlabored.   Cardiovascular Auscultation:Rythm- Regular, rate and rhythm. Murmurs & Other Heart Sounds:Ausculatation of the heart reveal- No Murmurs.  Lymphatic Head & Neck General Head & Neck Lymphatics: Bilateral: Description- No Localized lymphadenopathy.         Assessment & Plan:

## 2014-04-30 ENCOUNTER — Other Ambulatory Visit: Payer: Self-pay | Admitting: Internal Medicine

## 2014-05-08 ENCOUNTER — Telehealth: Payer: Self-pay | Admitting: Internal Medicine

## 2014-05-08 NOTE — Telephone Encounter (Signed)
Please advise 

## 2014-05-08 NOTE — Telephone Encounter (Signed)
Caller name: Westley Hummer Relation to pt: self Call back number: [phone] Pharmacy:  Reason for call:   Patient states that she had her ears flushed out about 4 weeks ago and has been having an itchy sensation in them ever since. She wants to know what she should do about this or if she needs to be referred.

## 2014-05-08 NOTE — Telephone Encounter (Signed)
Last lavage was June 2015, I can't tell if she needs to see the specialist. Please arrange a visit

## 2014-05-09 NOTE — Telephone Encounter (Signed)
Clarification, Pt was seen in December 2015 by Einar Pheasant and had ear lavage performed, per notes "successfully." Per Dr. Larose Kells, if Pt is still experiencing "itchiness, and/or pain," she will need to be seen by either Dr. Larose Kells or another provider.

## 2014-05-09 NOTE — Telephone Encounter (Signed)
Appointment scheduled for 05/15/14

## 2014-05-15 ENCOUNTER — Encounter: Payer: Self-pay | Admitting: Internal Medicine

## 2014-05-15 ENCOUNTER — Ambulatory Visit (INDEPENDENT_AMBULATORY_CARE_PROVIDER_SITE_OTHER): Payer: BC Managed Care – PPO | Admitting: Internal Medicine

## 2014-05-15 VITALS — BP 154/85 | HR 59 | Temp 97.5°F | Ht 62.0 in | Wt 156.1 lb

## 2014-05-15 DIAGNOSIS — L309 Dermatitis, unspecified: Secondary | ICD-10-CM | POA: Insufficient documentation

## 2014-05-15 MED ORDER — FLUOCINOLONE ACETONIDE 0.01 % OT OIL: TOPICAL_OIL | OTIC | Status: DC

## 2014-05-15 NOTE — Assessment & Plan Note (Signed)
Ear canal eczema, rx  topical steroids She also has hard, dry wax, recommend peroxide. If not improving will call for an ENT referral

## 2014-05-15 NOTE — Progress Notes (Signed)
Subjective:    Patient ID: Lori Goodwin, female    DOB: September 16, 1945, 69 y.o.   MRN: [id number]  DOS:  05/15/2014 Type of visit - description :  Acute Interval history: For a while is having on and off, left worse than right,  ear itching. No ear pain or discharge   Review of Systems Was seen with sinusitis, status post antibiotics, doing much better. No fever chills or runny nose. No sore throat. Cough resolved  Past Medical History  Diagnosis Date  . Allergic rhinitis   . Common migraine   . Hypertension     Past Surgical History  Procedure Laterality Date  . Tympanostomy tube placement      History   Social History  . Marital Status: Married    Spouse Name: N/A    Number of Children: 3  . Years of Education: N/A   Occupational History  . [employer]   Social History Main Topics  . Smoking status: Never Smoker   . Smokeless tobacco: Never Used  . Alcohol Use: No  . Drug Use: Not on file  . Sexual Activity: Not on file   Other Topics Concern  . Not on file   Social History Narrative   [place] from [place]   Divorced, [name] her son used to  lives w/ her     3 children ,1 son is paraplegic , has another that is a MD,other a PA          Medication List       This list is accurate as of: 05/15/14  7:18 PM.  Always use your most recent med list.               clobetasol 0.05 % external solution  Commonly known as:  TEMOVATE     Fluocinolone Acetonide 0.01 % Oil  2 drops on each ear twice a day     fluticasone 50 MCG/ACT nasal spray  Commonly known as:  FLONASE  Place 2 sprays into both nostrils daily.     multivitamin with minerals Tabs tablet  Take 1 tablet by mouth daily.     propranolol ER 60 MG 24 hr capsule  Commonly known as:  INDERAL LA  TAKE 1 CAPSULE BY MOUTH EVERY DAY     SUMAtriptan 50 MG tablet  Commonly known as:  IMITREX  TAKE 1 TO 2 TABLETS BY MOUTH AS NEEDED FOR HEADACHE AS DIRECTED            Objective:   Physical Exam  Constitutional: She is oriented to person, place, and time. She appears well-developed and well-nourished. No distress.  HENT:  Head: Normocephalic and atraumatic.  Ear canal B: No swelling, discharge.   External third of the canal and the concha very dry and scaly.    Neurological: She is alert and oriented to person, place, and time.  Skin: She is not diaphoretic.  Psychiatric: She has a normal mood and affect. Her behavior is normal. Judgment and thought content normal.   I was unable to remove wax with a spoon        Assessment & Plan:    Problem List Items Addressed This Visit      Musculoskeletal and Integument   Eczema, ear canal - Primary    Ear canal eczema, rx  topical steroids She also has hard, dry wax, recommend peroxide. If not improving will call for an ENT referral

## 2014-05-15 NOTE — Patient Instructions (Signed)
Use the eardrops as prescribed twice a day for the next 2 weeks You can also use peroxide to soften the wax. If you're not improving, please call me for a  ENT referral

## 2014-05-15 NOTE — Progress Notes (Signed)
Pre visit review using our clinic review tool, if applicable. No additional management support is needed unless otherwise documented below in the visit note. 

## 2014-07-11 ENCOUNTER — Other Ambulatory Visit: Payer: Self-pay

## 2014-07-23 ENCOUNTER — Ambulatory Visit: Payer: BC Managed Care – PPO | Admitting: Internal Medicine

## 2014-08-01 ENCOUNTER — Encounter: Payer: Self-pay | Admitting: Internal Medicine

## 2014-08-01 ENCOUNTER — Ambulatory Visit (INDEPENDENT_AMBULATORY_CARE_PROVIDER_SITE_OTHER): Payer: BC Managed Care – PPO | Admitting: Internal Medicine

## 2014-08-01 VITALS — BP 118/78 | HR 53 | Temp 98.3°F | Ht 62.0 in | Wt 154.4 lb

## 2014-08-01 DIAGNOSIS — G5603 Carpal tunnel syndrome, bilateral upper limbs: Secondary | ICD-10-CM

## 2014-08-01 DIAGNOSIS — G5601 Carpal tunnel syndrome, right upper limb: Secondary | ICD-10-CM

## 2014-08-01 DIAGNOSIS — G5602 Carpal tunnel syndrome, left upper limb: Secondary | ICD-10-CM | POA: Diagnosis not present

## 2014-08-01 DIAGNOSIS — G5600 Carpal tunnel syndrome, unspecified upper limb: Secondary | ICD-10-CM | POA: Insufficient documentation

## 2014-08-01 MED ORDER — SUMATRIPTAN SUCCINATE 50 MG PO TABS: 50.0000 mg | ORAL_TABLET | ORAL | Status: DC | PRN

## 2014-08-01 NOTE — Progress Notes (Signed)
   Subjective:    Patient ID: Lori Goodwin, female    DOB: [birth year], 69 y.o.   MRN: [id number]  DOS:  08/01/2014 Type of visit - description : acute, referral Interval history: History of carpal tunnel syndrome, symptoms getting worse, worse on the left hand. Described tingling and numbness at the thenar,thumb and index area. She already uses a splinter at nighttime.  Review of Systems Has mild neck pain, at baseline  Past Medical History  Diagnosis Date  . Allergic rhinitis   . Common migraine   . Hypertension     Past Surgical History  Procedure Laterality Date  . Tympanostomy tube placement      History   Social History  . Marital Status: Married    Spouse Name: N/A  . Number of Children: 3  . Years of Education: N/A   Occupational History  . [employer]   Social History Main Topics  . Smoking status: Never Smoker   . Smokeless tobacco: Never Used  . Alcohol Use: No  . Drug Use: Not on file  . Sexual Activity: Not on file   Other Topics Concern  . Not on file   Social History Narrative   [place] from [place]   Divorced, [name] her son used to  lives w/ her     3 children ,1 son is paraplegic , has another that is a MD,other a PA          Medication List       This list is accurate as of: 08/01/14  5:59 PM.  Always use your most recent med list.               clobetasol 0.05 % external solution  Commonly known as:  TEMOVATE     Fluocinolone Acetonide 0.01 % Oil  2 drops on each ear twice a day     fluticasone 50 MCG/ACT nasal spray  Commonly known as:  FLONASE  Place 2 sprays into both nostrils daily.     multivitamin with minerals Tabs tablet  Take 1 tablet by mouth daily.     propranolol ER 60 MG 24 hr capsule  Commonly known as:  INDERAL LA  TAKE 1 CAPSULE BY MOUTH EVERY DAY     SUMAtriptan 50 MG tablet  Commonly known as:  IMITREX  Take 1-2 tablets (50-100 mg total) by mouth as needed for headache.  May repeat in 2 hours if headache persists or recurs.           Objective:   Physical Exam BP 118/78 mmHg  Pulse 53  Temp(Src) 98.3 F (36.8 C) (Oral)  Ht 5\' 2"  (1.575 m)  Wt 154 lb 6 oz (70.024 kg)  BMI 28.23 kg/m2  SpO2 97% General:   Well developed, well nourished . NAD.  HEENT:  Normocephalic . Face symmetric, atraumatic    Muscle skeletal: no pretibial edema bilaterally  Inspection on palpation of the wrists and hands: Normal phalen's maneuver +,  mostly on the left. Skin: Not pale. Not jaundice Neurologic:  alert & oriented X3.  Speech normal, gait appropriate for age and unassisted DTRs symmetric Psych--  Cognition and judgment appear intact.  Cooperative with normal attention span and concentration.  Behavior appropriate. No anxious or depressed appearing.        Assessment & Plan:

## 2014-08-01 NOTE — Assessment & Plan Note (Signed)
Symptoms- findings consistent with CTS, she already saw Dr. Sherwood Gambler in the past, offered surgery but at the time she couldn't  proceed. Plan: Refer to Dr. Sherwood Gambler

## 2014-08-01 NOTE — Progress Notes (Signed)
Pre visit review using our clinic review tool, if applicable. No additional management support is needed unless otherwise documented below in the visit note. 

## 2014-08-13 ENCOUNTER — Telehealth: Payer: Self-pay | Admitting: Internal Medicine

## 2014-08-13 DIAGNOSIS — H6590 Unspecified nonsuppurative otitis media, unspecified ear: Secondary | ICD-10-CM

## 2014-08-13 NOTE — Telephone Encounter (Signed)
Referral placed to ENT

## 2014-08-13 NOTE — Telephone Encounter (Signed)
Relation to pt: self  Call back number: [phone]    Reason for call:  Pt requesting a referral to ENT pt states pain and pressure in ear

## 2014-08-13 NOTE — Telephone Encounter (Signed)
Ok. Dx ?serous otitis

## 2014-08-13 NOTE — Telephone Encounter (Signed)
Okay to place referral

## 2014-09-05 ENCOUNTER — Telehealth: Payer: Self-pay

## 2014-09-05 NOTE — Telephone Encounter (Signed)
Pt called yesterday requesting counselors for various reasons. Per Dr. Larose Kells, mail list of counselors to Pt. List of counselors placed in mail to Pt.

## 2014-10-30 ENCOUNTER — Telehealth: Payer: Self-pay | Admitting: Internal Medicine

## 2014-10-30 DIAGNOSIS — Z1211 Encounter for screening for malignant neoplasm of colon: Secondary | ICD-10-CM

## 2014-10-30 NOTE — Telephone Encounter (Signed)
Per 02/2014 CPE note, pt will call us when she is ready to proceed with colonoscopy.  Referral placed.

## 2014-10-30 NOTE — Telephone Encounter (Signed)
Caller name: Kalina Morabito Relationship to patient: self Can be reached: [phone]   Reason for call: Pt req referral for colonoscopy. Has never had one before.

## 2014-10-31 ENCOUNTER — Encounter: Payer: Self-pay | Admitting: Gastroenterology

## 2014-12-28 ENCOUNTER — Ambulatory Visit (AMBULATORY_SURGERY_CENTER): Payer: Self-pay

## 2014-12-28 VITALS — Ht 63.0 in | Wt 151.2 lb

## 2014-12-28 DIAGNOSIS — Z1211 Encounter for screening for malignant neoplasm of colon: Secondary | ICD-10-CM

## 2014-12-28 MED ORDER — MOVIPREP 100 G PO SOLR: 1.0000 | Freq: Once | ORAL | Status: DC

## 2014-12-28 NOTE — Progress Notes (Signed)
No allergies to eggs or soy No diet/weight loss meds No home oxygen No past problems with anesthesia  Refused emmi 

## 2015-01-11 ENCOUNTER — Encounter: Payer: Self-pay | Admitting: Gastroenterology

## 2015-01-11 ENCOUNTER — Telehealth: Payer: Self-pay | Admitting: *Deleted

## 2015-01-11 ENCOUNTER — Ambulatory Visit (AMBULATORY_SURGERY_CENTER): Payer: BC Managed Care – PPO | Admitting: Gastroenterology

## 2015-01-11 ENCOUNTER — Telehealth: Payer: Self-pay | Admitting: Gastroenterology

## 2015-01-11 VITALS — BP 98/51 | HR 41 | Temp 96.4°F | Resp 20 | Ht 63.0 in | Wt 151.0 lb

## 2015-01-11 DIAGNOSIS — D122 Benign neoplasm of ascending colon: Secondary | ICD-10-CM | POA: Diagnosis not present

## 2015-01-11 DIAGNOSIS — Z1211 Encounter for screening for malignant neoplasm of colon: Secondary | ICD-10-CM | POA: Diagnosis present

## 2015-01-11 DIAGNOSIS — D124 Benign neoplasm of descending colon: Secondary | ICD-10-CM

## 2015-01-11 DIAGNOSIS — D121 Benign neoplasm of appendix: Secondary | ICD-10-CM | POA: Diagnosis not present

## 2015-01-11 MED ORDER — SODIUM CHLORIDE 0.9 % IV SOLN: 500.0000 mL | INTRAVENOUS | Status: DC

## 2015-01-11 NOTE — Op Note (Signed)
[place]  [address] [place], [id number]   COLONOSCOPY PROCEDURE REPORT  PATIENT: Lori Goodwin, Lori Goodwin  MR#: [id number] BIRTHDATE: 1945/11/20 , 69  yrs. old GENDER: female ENDOSCOPIST: Milus Banister, MD REFERRED LP:FXTK Larose Kells, M.D. PROCEDURE DATE:  01/11/2015 PROCEDURE:   Colonoscopy, screening and Colonoscopy with snare polypectomy First Screening Colonoscopy - Avg.  risk and is 50 yrs.  old or older Yes.  Prior Negative Screening - Now for repeat screening. N/A  History of Adenoma - Now for follow-up colonoscopy & has been > or = to 3 yrs.  N/A  Polyps removed today? Yes ASA CLASS:   Class II INDICATIONS:Screening for colonic neoplasia and Colorectal Neoplasm Risk Assessment for this procedure is average risk. MEDICATIONS: Monitored anesthesia care and Propofol 200 mg IV  DESCRIPTION OF PROCEDURE:   After the risks benefits and alternatives of the procedure were thoroughly explained, informed consent was obtained.  The digital rectal exam revealed no abnormalities of the rectum.   The LB [id number]  endoscope was introduced through the anus and advanced to the cecum, which was identified by both the appendix and ileocecal valve. No adverse events experienced.   The quality of the prep was excellent.  The instrument was then slowly withdrawn as the colon was fully examined. Estimated blood loss is zero unless otherwise noted in this procedure report.  COLON FINDINGS: Two sessile polyps ranging between 3-73mm in size were found in the descending colon and at the appendiceal orifice. A polypectomy was performed with a cold snare.  The resection was complete, the polyp tissue was completely retrieved and sent to histology.   The examination was otherwise normal.  Retroflexed views revealed no abnormalities. The time to cecum = 2.8 Withdrawal time = 12.3   The scope was withdrawn and the procedure completed. COMPLICATIONS: There were no immediate  complications.  ENDOSCOPIC IMPRESSION: 1.   Two sessile polyps ranging between 3-58mm in size were found in the descending colon and at the appendiceal orifice; polypectomy was performed with a cold snare 2.   The examination was otherwise normal  RECOMMENDATIONS: If the polyp(s) removed today are proven to be adenomatous (pre-cancerous) polyps, you will need a repeat colonoscopy in 5 years.  Otherwise you should continue to follow colorectal cancer screening guidelines for "routine risk" patients with colonoscopy in 10 years.  You will receive a letter within 1-2 weeks with the results of your biopsy as well as final recommendations.  Please call my office if you have not received a letter after 3 weeks.  eSigned:  Milus Banister, MD 01/11/2015 8:39 AM

## 2015-01-11 NOTE — Telephone Encounter (Signed)
Called pt. To request that she let us know on Monday how her eye is doing, per Dr. Ardis Hughs request.  She states that she will call us back.

## 2015-01-11 NOTE — Telephone Encounter (Signed)
Pt. Called to say that she is having discomfort left eye after colonoscopy this AM. She thinks she may have been laying on her blood pressure cuff during procedure.  She denies any blurred vision.  States eye is not red, but She notices a "slight bluish color" in left corner of left eye.  No swelling.  She wonders  what to do.  Advised warm compresses a couple of times today, take Advil if needed, if persists call primary care.

## 2015-01-11 NOTE — Progress Notes (Signed)
Called to room to assist during endoscopic procedure.  Patient ID and intended procedure confirmed with present staff. Received instructions for my participation in the procedure from the performing physician.  

## 2015-01-11 NOTE — Telephone Encounter (Signed)
i agree with that, can you also ask her to call back on Monday to update on her symptoms

## 2015-01-11 NOTE — Progress Notes (Signed)
Transferred to recovery room. A/O x3, pleased with MAC.  VSS.  Report to Jane, RN. 

## 2015-01-11 NOTE — Patient Instructions (Signed)
YOU HAD AN ENDOSCOPIC PROCEDURE TODAY AT THE [facility]:   Refer to the procedure report that was given to you for any specific questions about what was found during the examination.  If the procedure report does not answer your questions, please call your gastroenterologist to clarify.  If you requested that your care partner not be given the details of your procedure findings, then the procedure report has been included in a sealed envelope for you to review at your convenience later.  YOU SHOULD EXPECT: Some feelings of bloating in the abdomen. Passage of more gas than usual.  Walking can help get rid of the air that was put into your GI tract during the procedure and reduce the bloating. If you had a lower endoscopy (such as a colonoscopy or flexible sigmoidoscopy) you may notice spotting of blood in your stool or on the toilet paper. If you underwent a bowel prep for your procedure, you may not have a normal bowel movement for a few days.  Please Note:  You might notice some irritation and congestion in your nose or some drainage.  This is from the oxygen used during your procedure.  There is no need for concern and it should clear up in a day or so.  SYMPTOMS TO REPORT IMMEDIATELY:   Following lower endoscopy (colonoscopy or flexible sigmoidoscopy):  Excessive amounts of blood in the stool  Significant tenderness or worsening of abdominal pains  Swelling of the abdomen that is new, acute  Fever of 100F or higher   For urgent or emergent issues, a gastroenterologist can be reached at any hour by calling [phone].   DIET: Your first meal following the procedure should be a small meal and then it is ok to progress to your normal diet. Heavy or fried foods are harder to digest and may make you feel nauseous or bloated.  Likewise, meals heavy in dairy and vegetables can increase bloating.  Drink plenty of fluids but you should avoid alcoholic beverages for 24  hours.  ACTIVITY:  You should plan to take it easy for the rest of today and you should NOT DRIVE or use heavy machinery until tomorrow (because of the sedation medicines used during the test).    FOLLOW UP: Our staff will call the number listed on your records the next business day following your procedure to check on you and address any questions or concerns that you may have regarding the information given to you following your procedure. If we do not reach you, we will leave a message.  However, if you are feeling well and you are not experiencing any problems, there is no need to return our call.  We will assume that you have returned to your regular daily activities without incident.  If any biopsies were taken you will be contacted by phone or by letter within the next 1-3 weeks.  Please call us at [phone] if you have not heard about the biopsies in 3 weeks.    SIGNATURES/CONFIDENTIALITY: You and/or your care partner have signed paperwork which will be entered into your electronic medical record.  These signatures attest to the fact that that the information above on your After Visit Summary has been reviewed and is understood.  Full responsibility of the confidentiality of this discharge information lies with you and/or your care-partner.  Polyp information given.  

## 2015-01-14 ENCOUNTER — Telehealth: Payer: Self-pay | Admitting: *Deleted

## 2015-01-14 NOTE — Telephone Encounter (Signed)
Patient states that she had to go to the eye doctor on Friday due to her left eye pain.  She states that the dr told her that her eye was dried out badly. He gave her eye drops to help, and she is due to go back this week for evaluation.

## 2015-01-14 NOTE — Telephone Encounter (Signed)
Yes, please also alert [facility]

## 2015-01-14 NOTE — Telephone Encounter (Signed)
  Follow up Call-  Call back number 01/11/2015  Post procedure Call Back phone  # [phone]  Permission to leave phone message Yes     Patient questions:  Do you have a fever, pain , or abdominal swelling? No. Pain Score  0 *  Have you tolerated food without any problems? Yes.    Have you been able to return to your normal activities? Yes.    Do you have any questions about your discharge instructions: Diet   No. Medications  No. Follow up visit  No  Do you have questions or concerns about your Care? Yes.    Actions: * If pain score is 4 or above: No action needed, pain <4.    Patient states that she had to go to the eye doctor due to the pain in her left eye. The doctor gave her drops to put in it, and stated that it was "dried out."

## 2015-01-16 ENCOUNTER — Telehealth: Payer: Self-pay | Admitting: Gastroenterology

## 2015-01-16 ENCOUNTER — Encounter: Payer: Self-pay | Admitting: Gastroenterology

## 2015-01-16 NOTE — Telephone Encounter (Signed)
Left message for pt to call back  °

## 2015-01-16 NOTE — Telephone Encounter (Signed)
Pt called to let Dr. Ardis Hughs know that she saw her eye doctor and she is using the drops to help with the dryness. She will have to go back to have it checked again. Also states that now there is a blue mark in the corner of her left eye and the eye doctor states this is where the eye was affected by the dryness. Pt wants to know how this could have happened. Pt states the purpose of the call is to show her concern and hope that this does not happen to another patient. Dr. Ardis Hughs please advise.

## 2015-01-17 NOTE — Telephone Encounter (Signed)
i appreciate her information. I alerted Osvaldo Angst from out anesthesia team and he was planning to reach out to her.  I believe this was forwarded to [facility] and [place]lee keep Remo Lipps in the loop as well.  Let her know we appreciate the information and are looking into this.

## 2015-01-17 NOTE — Telephone Encounter (Signed)
Pt has been notified and will call with further concerns  

## 2015-01-22 ENCOUNTER — Telehealth: Payer: Self-pay | Admitting: Gastroenterology

## 2015-01-22 NOTE — Telephone Encounter (Signed)
Dr Ardis Lori Goodwin the pt still has concerns over her eyes and would like to speak with you regarding this information.

## 2015-01-23 NOTE — Telephone Encounter (Signed)
I left a message on her VM today asking her to call back to the office.    I spoke with Osvaldo Angst, he called her twice recently without answer. He did not leave messages.

## 2015-01-23 NOTE — Telephone Encounter (Signed)
John and I both spoke with her today. She still has left eye irritation but seems that it is slowly improving. She plans to see her eye doctor in follow up soon. I asked that she send any eye follow up records to [place] so that we can stay in the loop of her care.  Told her to call with any further concerns or questions.  Will cotninue to follow, look into this as part of our continuous quality improvement process.

## 2015-01-25 ENCOUNTER — Telehealth: Payer: Self-pay | Admitting: Gastroenterology

## 2015-01-25 NOTE — Telephone Encounter (Signed)
Please let her know I got her message and we will look into this.  I have passed it on to Anastasia Pall, Conservation officer, [facility].

## 2015-01-25 NOTE — Telephone Encounter (Signed)
Pt states that so far she has spent over $100 on her eye due to it drying out during her colon. States that she is not finished with being seen for this and her copay is $70. Pt wants to know if she can get assistance with these bills. Please advise.

## 2015-01-25 NOTE — Telephone Encounter (Signed)
Pt is aware.  

## 2015-01-25 NOTE — Telephone Encounter (Signed)
Left message for pt to call back  °

## 2015-01-27 ENCOUNTER — Other Ambulatory Visit: Payer: Self-pay | Admitting: Internal Medicine

## 2015-02-26 ENCOUNTER — Telehealth: Payer: Self-pay | Admitting: *Deleted

## 2015-02-26 ENCOUNTER — Encounter: Payer: BC Managed Care – PPO | Admitting: Internal Medicine

## 2015-02-26 NOTE — Telephone Encounter (Signed)
PA initiated. Awaiting determination. JG//CMA 

## 2015-03-18 ENCOUNTER — Ambulatory Visit (INDEPENDENT_AMBULATORY_CARE_PROVIDER_SITE_OTHER): Payer: BC Managed Care – PPO | Admitting: Internal Medicine

## 2015-03-18 ENCOUNTER — Encounter: Payer: Self-pay | Admitting: Internal Medicine

## 2015-03-18 VITALS — BP 116/74 | HR 41 | Temp 97.6°F | Ht 63.0 in | Wt 152.1 lb

## 2015-03-18 DIAGNOSIS — R739 Hyperglycemia, unspecified: Secondary | ICD-10-CM | POA: Diagnosis not present

## 2015-03-18 DIAGNOSIS — Z23 Encounter for immunization: Secondary | ICD-10-CM | POA: Diagnosis not present

## 2015-03-18 DIAGNOSIS — Z0000 Encounter for general adult medical examination without abnormal findings: Secondary | ICD-10-CM

## 2015-03-18 DIAGNOSIS — Z09 Encounter for follow-up examination after completed treatment for conditions other than malignant neoplasm: Secondary | ICD-10-CM | POA: Insufficient documentation

## 2015-03-18 DIAGNOSIS — Z1159 Encounter for screening for other viral diseases: Secondary | ICD-10-CM

## 2015-03-18 NOTE — Assessment & Plan Note (Addendum)
Td 2011 Had a Flu shot @ [facility] 01-2015 Pneumonia shot --2015 Prevnar-- today zostavax discussed before   Female care at [facility] Reports a mammogram 12-2014 per pt @ [facility] Menopause, never had a bone density test, "will think about it"  Colonoscopy 01-2015, + polyps, Dr Ardis Hughs, 5 years  Labs  Continue with healthy lifestyle

## 2015-03-18 NOTE — Patient Instructions (Signed)
Get your blood work before you leave    Check the  blood pressure 2 or 3 times a month  Be sure your blood pressure is between 110/65 and  145/85.  if it is consistently higher or lower, let me know   Next visit  for a routine checkup in 6-8 months, no fasting   (15 minutes) Please schedule an appointment at the front desk

## 2015-03-18 NOTE — Assessment & Plan Note (Signed)
HTN: Seems well-controlled, on propranolol ER 60 mg. Heart rate low but she is completely asymptomatic. Warned about symptoms of bradycardia. Migraines: On beta blockers, very infrequent attacks on Imitrex as needed Foot pain-- rec trial w/ arch support shoe insert , call if no better  RTC 6-8  months,

## 2015-03-18 NOTE — Progress Notes (Signed)
Subjective:    Patient ID: Lori Goodwin, female    DOB: June 30, 1945, 69 y.o.   MRN: [id number]  DOS:  03/18/2015 Type of visit - description : CPX Interval history:  In general feeling well  Review of Systems Constitutional: No fever. No chills. No unexplained wt changes. No unusual sweats  HEENT: No dental problems, no ear discharge, no facial swelling, no voice changes. No eye discharge, no eye  redness , no  intolerance to light   Respiratory: No wheezing , no  difficulty breathing. No cough , no mucus production  Cardiovascular: No CP, no leg swelling , no  Palpitations  GI: no nausea, no vomiting, no diarrhea , no  abdominal pain.  No blood in the stools. No dysphagia, no odynophagia    Endocrine: No polyphagia, no polyuria , no polydipsia  GU: No dysuria, gross hematuria, difficulty urinating. No urinary urgency, no frequency.  Musculoskeletal: Complaining of pain at the plantar aspect, right foot, distally. Worse with walking. Chronic neck pain at baseline  Skin: No change in the color of the skin, palor , no  Rash  Allergic, immunologic: No environmental allergies , no  food allergies  Neurological: No dizziness no  syncope. No headaches. No diplopia, no slurred, no slurred speech, no motor deficits, no facial  Numbness  Hematological: No enlarged lymph nodes, no easy bruising , no unusual bleedings  Psychiatry: No suicidal ideas, no hallucinations, no beavior problems, no confusion.  No unusual/severe anxiety, no depression   Past Medical History  Diagnosis Date  . Allergic rhinitis   . Common migraine   . Hypertension     Past Surgical History  Procedure Laterality Date  . Tympanostomy tube placement      Social History   Social History  . Marital Status: Married    Spouse Name: N/A  . Number of Children: 3  . Years of Education: N/A   Occupational History  . [employer]   Social History Main Topics  . Smoking  status: Never Smoker   . Smokeless tobacco: Never Used  . Alcohol Use: No  . Drug Use: Not on file  . Sexual Activity: Not on file   Other Topics Concern  . Not on file   Social History Narrative   [place] from [place]   Divorced, remarried   [name] her son lives in [place]     3 children ,1 son is paraplegic , has another that is a MD,other a PA       Family History  Problem Relation Age of Onset  . Colon cancer Neg Hx   . Breast cancer Neg Hx   . Diabetes Neg Hx   . Heart attack Neg Hx        Medication List       This list is accurate as of: 03/18/15  7:06 PM.  Always use your most recent med list.               clobetasol 0.05 % external solution  Commonly known as:  TEMOVATE     multivitamin with minerals Tabs tablet  Take 1 tablet by mouth daily.     propranolol ER 60 MG 24 hr capsule  Commonly known as:  INDERAL LA  Take 1 capsule (60 mg total) by mouth daily.     SUMAtriptan 50 MG tablet  Commonly known as:  IMITREX  Take 1-2 tablets (50-100 mg total) by mouth as needed for headache. May repeat  in 2 hours if headache persists or recurs.           Objective:   Physical Exam BP 116/74 mmHg  Pulse 41  Temp(Src) 97.6 F (36.4 C) (Oral)  Ht 5\' 3"  (1.6 m)  Wt 152 lb 2 oz (69.003 kg)  BMI 26.95 kg/m2  SpO2 98% General:   Well developed, well nourished . NAD.  Neck:   No  thyromegaly , normal carotid pulse HEENT:  Normocephalic . Face symmetric, atraumatic Lungs:  CTA B Normal respiratory effort, no intercostal retractions, no accessory muscle use. Heart: Bradycardic,  no murmur.  No pretibial edema bilaterally  Abdomen:  Not distended, soft, non-tender. No rebound or rigidity.   MSK, feet: Has a high arch, no TTP at the plantar aspect of the feet Skin: Exposed areas without rash. Not pale. Not jaundice Neurologic:  alert & oriented X3.  Speech normal, gait appropriate for age and unassisted Strength symmetric and appropriate for age.    Psych: Cognition and judgment appear intact.  Cooperative with normal attention span and concentration.  Behavior appropriate. No anxious or depressed appearing.    Assessment & Plan:   Assessment > HTN MSK: --DJD --Neck pain: Saw neurosurgery, MRI was done, DX DJD, nonsurgical candidate (2013 was ( --CTS: Previously seen by surgery, declined surgery  Migraines Ear canal eczema  Plan: HTN: Seems well-controlled, on propranolol ER 60 mg. Heart rate low but she is completely asymptomatic. Warned about symptoms of bradycardia. Migraines: On beta blockers, very infrequent attacks on Imitrex as needed Foot pain-- rec trial w/ arch support shoe insert , call if no better  RTC 6-8  months,

## 2015-03-18 NOTE — Progress Notes (Signed)
Pre visit review using our clinic review tool, if applicable. No additional management support is needed unless otherwise documented below in the visit note. 

## 2015-03-19 LAB — COMPREHENSIVE METABOLIC PANEL
ALT: 19 U/L (ref 0–35)
AST: 23 U/L (ref 0–37)
Albumin: 4.3 g/dL (ref 3.5–5.2)
Alkaline Phosphatase: 63 U/L (ref 39–117)
BILIRUBIN TOTAL: 0.6 mg/dL (ref 0.2–1.2)
BUN: 16 mg/dL (ref 6–23)
CHLORIDE: 105 meq/L (ref 96–112)
CO2: 29 meq/L (ref 19–32)
Calcium: 9.9 mg/dL (ref 8.4–10.5)
Creatinine, Ser: 0.7 mg/dL (ref 0.40–1.20)
GFR: 88.28 mL/min (ref >60.00)
GLUCOSE: 90 mg/dL (ref 70–99)
Potassium: 4.5 mEq/L (ref 3.5–5.1)
Sodium: 140 mEq/L (ref 135–145)
Total Protein: 7 g/dL (ref 6.0–8.3)

## 2015-03-19 LAB — HEMOGLOBIN A1C: Hgb A1c MFr Bld: 6 % (ref 4.6–6.5)

## 2015-03-19 LAB — LIPID PANEL
CHOL/HDL RATIO: 3
Cholesterol: 157 mg/dL (ref 0–200)
HDL: 55.5 mg/dL (ref >39.00)
LDL CALC: 89 mg/dL (ref 0–99)
NONHDL: 101.63
Triglycerides: 64 mg/dL (ref 0.0–149.0)
VLDL: 12.8 mg/dL (ref 0.0–40.0)

## 2015-03-19 LAB — HEPATITIS C ANTIBODY: HCV AB: NEGATIVE

## 2015-03-19 LAB — TSH: TSH: 1.08 u[IU]/mL (ref 0.35–4.50)

## 2015-03-25 ENCOUNTER — Telehealth: Payer: Self-pay | Admitting: Internal Medicine

## 2015-03-25 DIAGNOSIS — Z13820 Encounter for screening for osteoporosis: Secondary | ICD-10-CM

## 2015-03-25 DIAGNOSIS — Z780 Asymptomatic menopausal state: Secondary | ICD-10-CM

## 2015-03-25 NOTE — Telephone Encounter (Signed)
Nothing in chart indicating Pt can not receive Shingles shot. Bone density ordered for [facility].

## 2015-03-25 NOTE — Telephone Encounter (Signed)
Pt called to schedule shingles shot (on nurse schedule for 12/22) and to proceed with bone density scan. She said both were discussed 12/12 at annual exam and she was deciding.

## 2015-03-28 ENCOUNTER — Ambulatory Visit (INDEPENDENT_AMBULATORY_CARE_PROVIDER_SITE_OTHER): Payer: BC Managed Care – PPO

## 2015-03-28 DIAGNOSIS — Z23 Encounter for immunization: Secondary | ICD-10-CM

## 2015-04-02 ENCOUNTER — Ambulatory Visit: Payer: BC Managed Care – PPO | Admitting: Internal Medicine

## 2015-04-04 ENCOUNTER — Ambulatory Visit: Payer: BC Managed Care – PPO | Admitting: Internal Medicine

## 2015-04-26 ENCOUNTER — Other Ambulatory Visit: Payer: Self-pay | Admitting: Internal Medicine

## 2015-05-02 ENCOUNTER — Telehealth: Payer: Self-pay | Admitting: Internal Medicine

## 2015-05-02 DIAGNOSIS — M79671 Pain in right foot: Secondary | ICD-10-CM

## 2015-05-02 NOTE — Telephone Encounter (Signed)
OV note from 03/18/2015 reviewed, right foot pain discussed w/ PCP. Will place referral to Ortho.

## 2015-05-02 NOTE — Telephone Encounter (Signed)
Pt says that she have discussed feet pain with her PCP, pt would like to have a referral to a Ortho. Specialist if possible.   Please assist further.     CB: [phone]

## 2015-06-20 ENCOUNTER — Telehealth: Payer: Self-pay | Admitting: Internal Medicine

## 2015-06-20 DIAGNOSIS — L853 Xerosis cutis: Secondary | ICD-10-CM

## 2015-06-20 NOTE — Telephone Encounter (Signed)
Please advise 

## 2015-06-20 NOTE — Telephone Encounter (Signed)
Pt has dry itch skin behind ears. She is asking if she should come here or get referral to dermatology

## 2015-06-21 ENCOUNTER — Encounter: Payer: Self-pay | Admitting: Internal Medicine

## 2015-06-21 NOTE — Telephone Encounter (Signed)
Referral placed.

## 2015-06-21 NOTE — Telephone Encounter (Signed)
Yes, please arrange

## 2015-08-31 ENCOUNTER — Other Ambulatory Visit: Payer: Self-pay | Admitting: Internal Medicine

## 2015-09-03 NOTE — Telephone Encounter (Signed)
Pt is requesting refill on Imitrex 50 mg. Dr. Ethel Rana Pt.  Last OV: 03/18/2015 Last Fill: 08/01/2014 #30 and 2 RF Pt sig: 1-2 tablets daily PRN. May repeat in 2 hours if persists or reoccurs  Please advise.

## 2015-09-03 NOTE — Telephone Encounter (Signed)
Rx sent 

## 2015-09-03 NOTE — Telephone Encounter (Signed)
OK to refill Imitrex same strength, same sig, # 30 no rf

## 2015-09-16 ENCOUNTER — Ambulatory Visit: Payer: BC Managed Care – PPO | Admitting: Internal Medicine

## 2016-01-05 HISTORY — PX: CARPAL TUNNEL RELEASE: SHX101

## 2016-01-20 ENCOUNTER — Other Ambulatory Visit: Payer: Self-pay | Admitting: Family Medicine

## 2016-01-20 NOTE — Telephone Encounter (Signed)
Okay #30, no refills 

## 2016-01-20 NOTE — Telephone Encounter (Signed)
Rx sent 

## 2016-01-20 NOTE — Telephone Encounter (Signed)
Pt is requesting refill on Imitrex.  Last OV: 03/18/2015 Last Fill: 09/03/2015 #30 and 0RF  Okay to refill?

## 2016-01-26 ENCOUNTER — Other Ambulatory Visit: Payer: Self-pay | Admitting: Internal Medicine

## 2016-02-05 LAB — HM MAMMOGRAPHY

## 2016-02-20 ENCOUNTER — Encounter: Payer: Self-pay | Admitting: Internal Medicine

## 2016-05-04 ENCOUNTER — Other Ambulatory Visit: Payer: Self-pay | Admitting: Internal Medicine

## 2016-05-04 ENCOUNTER — Ambulatory Visit (INDEPENDENT_AMBULATORY_CARE_PROVIDER_SITE_OTHER): Payer: BC Managed Care – PPO | Admitting: Internal Medicine

## 2016-05-04 ENCOUNTER — Encounter: Payer: Self-pay | Admitting: Internal Medicine

## 2016-05-04 VITALS — BP 118/74 | HR 76 | Temp 98.2°F | Resp 14 | Ht 63.0 in | Wt 156.5 lb

## 2016-05-04 DIAGNOSIS — R739 Hyperglycemia, unspecified: Secondary | ICD-10-CM

## 2016-05-04 DIAGNOSIS — M79672 Pain in left foot: Secondary | ICD-10-CM

## 2016-05-04 DIAGNOSIS — Z780 Asymptomatic menopausal state: Secondary | ICD-10-CM

## 2016-05-04 DIAGNOSIS — Z0000 Encounter for general adult medical examination without abnormal findings: Secondary | ICD-10-CM | POA: Diagnosis not present

## 2016-05-04 DIAGNOSIS — M79671 Pain in right foot: Secondary | ICD-10-CM

## 2016-05-04 MED ORDER — OSELTAMIVIR PHOSPHATE 75 MG PO CAPS: 75.0000 mg | ORAL_CAPSULE | Freq: Two times a day (BID) | ORAL | 0 refills | Status: DC

## 2016-05-04 NOTE — Progress Notes (Signed)
Subjective:    Patient ID: Lori Goodwin, female    DOB: 1945/11/15, 72 y.o.   MRN: [id number]  DOS:  05/04/2016 Type of visit - description : CPX Interval history: has few concerns, see below     Review of Systems  Yesterday, had sudden episodes of subjective fever and generalized aches, denies cough, runny nose, sore throat, nausea vomiting or diarrhea. Today she had no fever but some malaise.  Constitutional: No fever. No chills. No unexplained wt changes. No unusual sweats  HEENT: No dental problems, no ear discharge, no facial swelling, no voice changes. No eye discharge, no eye  redness , no  intolerance to light   Respiratory: No wheezing , no  difficulty breathing. No cough , no mucus production  Cardiovascular: No CP, no leg swelling , no  Palpitations  GI: no nausea, no vomiting, no diarrhea , no  abdominal pain.  No blood in the stools. No dysphagia, no odynophagia    Endocrine: No polyphagia, no polyuria , no polydipsia  GU: No dysuria, gross hematuria, difficulty urinating. No urinary urgency, no frequency.  Musculoskeletal: Complaint of bilateral foot pain, inner aspect after walking. No injury  Skin: No change in the color of the skin, palor , no  Rash  Allergic, immunologic: No environmental allergies , no  food allergies  Neurological: No dizziness no  syncope. No headaches. No diplopia, no slurred, no slurred speech, no motor deficits, no facial  Numbness  Hematological: No enlarged lymph nodes, no easy bruising , no unusual bleedings  Psychiatry: No suicidal ideas, no hallucinations, no beavior problems, no confusion.  No unusual/severe anxiety, no depression   Past Medical History:  Diagnosis Date  . Allergic rhinitis   . Common migraine   . Hypertension     Past Surgical History:  Procedure Laterality Date  . CARPAL TUNNEL RELEASE Left 01/05/2016  . TYMPANOSTOMY TUBE PLACEMENT      Social History   Social History  . Marital status:  Married    Spouse name: N/A  . Number of children: 3  . Years of education: N/A   Occupational History  . [employer]   Social History Main Topics  . Smoking status: Never Smoker  . Smokeless tobacco: Never Used  . Alcohol use No  . Drug use: No  . Sexual activity: Not on file   Other Topics Concern  . Not on file   Social History Narrative   [place] from [place]   Divorced, remarried   [name] her son lives in [place]     3 children ,1 son is paraplegic , has another that is a MD,other a PA       Family History  Problem Relation Age of Onset  . Colon cancer Neg Hx   . Breast cancer Neg Hx   . Diabetes Neg Hx   . Heart attack Neg Hx      Allergies as of 05/04/2016      Reactions   Tylenol [acetaminophen] Shortness Of Breath      Medication List       Accurate as of 05/04/16 11:59 PM. Always use your most recent med list.          multivitamin with minerals Tabs tablet Take 1 tablet by mouth daily.   oseltamivir 75 MG capsule Commonly known as:  TAMIFLU Take 1 capsule (75 mg total) by mouth 2 (two) times daily.   propranolol ER 60 MG 24 hr capsule Commonly  known as:  INDERAL LA Take 1 capsule (60 mg total) by mouth daily.   SUMAtriptan 50 MG tablet Commonly known as:  IMITREX TAKE 1- 2 TABLETS BY MOUTH AS NEEDED FOR HEADACHE, MAY REPEAT IN 2 HOURS IF HEADACHE PERSISTS OR RECURS          Objective:   Physical Exam BP 118/74 (BP Location: Left Arm, Patient Position: Sitting, Cuff Size: Small)   Pulse 76   Temp 98.2 F (36.8 C) (Oral)   Resp 14   Ht 5\' 3"  (1.6 m)   Wt 156 lb 8 oz (71 kg)   SpO2 98%   BMI 27.72 kg/m   General:   Well developed, well nourished . NAD.  Neck: No  thyromegaly  HEENT:  Normocephalic . Face symmetric, atraumatic Lungs:  CTA B Normal respiratory effort, no intercostal retractions, no accessory muscle use. Heart: RRR,  ?soft syst  murmur.  No pretibial edema bilaterally  Abdomen:  Not  distended, soft, non-tender. No rebound or rigidity.   Skin: Exposed areas without rash. Not pale. Not jaundice MSK: Feet: High arch bilaterally, otherwise normal to inspection on palpation. Neurologic:  alert & oriented X3.  Speech normal, gait appropriate for age and unassisted Strength symmetric and appropriate for age.  Psych: Cognition and judgment appear intact.  Cooperative with normal attention span and concentration.  Behavior appropriate. No anxious or depressed appearing.    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 To be in the cautious side, recommend Tamiflu for 5 days. Flu shot in a couple of weeks. Question of soft systolic murmur no previously observed. No sx. We'll reassess next year and consider a echo. Foot pain bilaterally, she has a very high arch on exam, refer to sports medicine, shoe inserts? RTC one year

## 2016-05-04 NOTE — Assessment & Plan Note (Signed)
Td 2011; Pneumonia shot --2015; Prevnar-- 2016; zostavax 03-2015  Female care - at [facility], gets PAPs, MMGs.  Menopause, rx dexa last time, not done, re order   Colonoscopy 01-2015, + polyps, Dr Ardis Hughs, 5 years  Labs : BMP, FLP, CBC, A1c Recommend a healthy lifestyle

## 2016-05-04 NOTE — Progress Notes (Signed)
Pre visit review using our clinic review tool, if applicable. No additional management support is needed unless otherwise documented below in the visit note. 

## 2016-05-04 NOTE — Patient Instructions (Signed)
  GO TO THE FRONT DESK Schedule your next appointment for a  physical exam in one year  Schedule labs to be done this week, fasting    You may be developing the flu Rest, fluids, Tylenol, Mucinex DM for cough if needed, start Tamiflu today  Call anytime if you have severe fever, chills, aches or a cough.

## 2016-05-05 NOTE — Assessment & Plan Note (Signed)
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 To be in the cautious side, recommend Tamiflu for 5 days. Flu shot in a couple of weeks. Question of soft systolic murmur no previously observed. No sx. We'll reassess next year and consider a echo. Foot pain bilaterally, she has a very high arch on exam, refer to sports medicine, shoe inserts? RTC one year

## 2016-05-06 ENCOUNTER — Other Ambulatory Visit (INDEPENDENT_AMBULATORY_CARE_PROVIDER_SITE_OTHER): Payer: BC Managed Care – PPO

## 2016-05-06 DIAGNOSIS — R739 Hyperglycemia, unspecified: Secondary | ICD-10-CM | POA: Diagnosis not present

## 2016-05-06 DIAGNOSIS — Z0000 Encounter for general adult medical examination without abnormal findings: Secondary | ICD-10-CM

## 2016-05-06 LAB — LIPID PANEL
Cholesterol: 164 mg/dL (ref 0–200)
HDL: 50.3 mg/dL (ref >39.00)
LDL Cholesterol: 91 mg/dL (ref 0–99)
NONHDL: 113.69
Total CHOL/HDL Ratio: 3
Triglycerides: 112 mg/dL (ref 0.0–149.0)
VLDL: 22.4 mg/dL (ref 0.0–40.0)

## 2016-05-06 LAB — CBC WITH DIFFERENTIAL/PLATELET
BASOS PCT: 1.1 % (ref 0.0–3.0)
Basophils Absolute: 0.1 10*3/uL (ref 0.0–0.1)
EOS PCT: 5.9 % — AB (ref 0.0–5.0)
Eosinophils Absolute: 0.3 10*3/uL (ref 0.0–0.7)
HCT: 40.1 % (ref 36.0–46.0)
HEMOGLOBIN: 13.7 g/dL (ref 12.0–15.0)
Lymphocytes Relative: 41.3 % (ref 12.0–46.0)
Lymphs Abs: 2.3 10*3/uL (ref 0.7–4.0)
MCHC: 34.1 g/dL (ref 30.0–36.0)
MCV: 91.9 fl (ref 78.0–100.0)
MONOS PCT: 6.7 % (ref 3.0–12.0)
Monocytes Absolute: 0.4 10*3/uL (ref 0.1–1.0)
Neutro Abs: 2.5 10*3/uL (ref 1.4–7.7)
Neutrophils Relative %: 45 % (ref 43.0–77.0)
Platelets: 218 10*3/uL (ref 150.0–400.0)
RBC: 4.36 Mil/uL (ref 3.87–5.11)
RDW: 12.5 % (ref 11.5–15.5)
WBC: 5.7 10*3/uL (ref 4.0–10.5)

## 2016-05-06 LAB — BASIC METABOLIC PANEL
BUN: 21 mg/dL (ref 6–23)
CALCIUM: 9.6 mg/dL (ref 8.4–10.5)
CO2: 29 mEq/L (ref 19–32)
Chloride: 104 mEq/L (ref 96–112)
Creatinine, Ser: 0.78 mg/dL (ref 0.40–1.20)
GFR: 77.66 mL/min (ref >60.00)
GLUCOSE: 104 mg/dL — AB (ref 70–99)
POTASSIUM: 3.9 meq/L (ref 3.5–5.1)
SODIUM: 138 meq/L (ref 135–145)

## 2016-05-06 LAB — HEMOGLOBIN A1C: Hgb A1c MFr Bld: 6 % (ref 4.6–6.5)

## 2016-05-24 ENCOUNTER — Other Ambulatory Visit: Payer: Self-pay | Admitting: Internal Medicine

## 2016-05-26 ENCOUNTER — Other Ambulatory Visit (HOSPITAL_BASED_OUTPATIENT_CLINIC_OR_DEPARTMENT_OTHER): Payer: BC Managed Care – PPO

## 2016-06-01 NOTE — Progress Notes (Signed)
[facility] [address] [place], [place] [address] Phone: [phone] Subjective:    I'm seeing this patient by the request  of:   Kathlene November, MD   CC: bilateral foot pain   [id number]  Lori Goodwin is a 71 y.o. female coming in with complaint of bilateral foot pain. Worse with activity.  Has been going on for months. Denies any injury mild swelling. Hurts after activity  No numbness or weakness has been walking more recently.  Rates severity of 6/10.  Does ot try anything but heat that helps.       Past Medical History:  Diagnosis Date  . Allergic rhinitis   . Common migraine   . Hypertension    Past Surgical History:  Procedure Laterality Date  . CARPAL TUNNEL RELEASE Left 01/05/2016  . TYMPANOSTOMY TUBE PLACEMENT     Social History   Social History  . Marital status: Married    Spouse name: N/A  . Number of children: 3  . Years of education: N/A   Occupational History  . [employer]   Social History Main Topics  . Smoking status: Never Smoker  . Smokeless tobacco: Never Used  . Alcohol use No  . Drug use: No  . Sexual activity: Not Asked   Other Topics Concern  . None   Social History Narrative   [place] from [place]   Divorced, remarried   [name] her son lives in [place]     3 children ,1 son is paraplegic , has another that is a MD,other a PA     Allergies  Allergen Reactions  . Tylenol [Acetaminophen] Shortness Of Breath   Family History  Problem Relation Age of Onset  . Colon cancer Neg Hx   . Breast cancer Neg Hx   . Diabetes Neg Hx   . Heart attack Neg Hx     Past medical history, social, surgical and family history all reviewed in electronic medical record.  No pertanent information unless stated regarding to the chief complaint.   Review of Systems:Review of systems updated and as accurate as of 06/02/16  No headache, visual changes, nausea, vomiting, diarrhea,  constipation, dizziness, abdominal pain, skin rash, fevers, chills, night sweats, weight loss, swollen lymph nodes, body aches, joint swelling, muscle aches, chest pain, shortness of breath, mood changes.   Objective  Blood pressure 138/84, pulse 60, height 5\' 3"  (1.6 m), weight 156 lb (70.8 kg). Systems examined below as of 06/02/16   General: No apparent distress alert and oriented x3 mood and affect normal, dressed appropriately.  HEENT: Pupils equal, extraocular movements intact  Respiratory: Patient's speak in full sentences and does not appear short of breath  Cardiovascular: No lower extremity edema, non tender, no erythema  Skin: Warm dry intact with no signs of infection or rash on extremities or on axial skeleton.  Abdomen: Soft nontender  Neuro: Cranial nerves II through XII are intact, neurovascularly intact in all extremities with 2+ DTRs and 2+ pulses.  Lymph: No lymphadenopathy of posterior or anterior cervical chain or axillae bilaterally.  Gait normal with good balance and coordination.  MSK:  Non tender with full range of motion and good stability and symmetric strength and tone of shoulders, elbows, wrist, hip, knee and ankles bilaterally.  Foot exam shows the patient does have breakdown of the longitudinal as well as the transverse arch. Splaying between the first and second toes. Patient does have tenderness to palpation  over the first metatarsal. No pain over the navicular bone.  \ Limited muscular skeletal ultrasound was performed and interpreted by [name]  Limited [place] skeletal ultrasound was performed and interpreted showing patient did have arthritic narrowing of the midfoot. Patient does have some mild increase in Doppler flow of the proximal aspect of the metatarsal of the first metatarsal. Potential stress fracture. Impression: Midfoot arthritis  Procedure note [id number]; 15 minutes spent for Therapeutic exercises as stated in above notes.  This included  exercises focusing on stretching, strengthening, with significant focus on eccentric aspects. Exercises for the foot include:  Stretches to help lengthen the lower leg and plantar fascia areas Theraband exercises for the lower leg and ankle to help strengthen the surrounding area- dorsiflexion, plantarflexion, inversion, eversion Massage rolling on the plantar surface of the foot with a frozen bottle, tennis ball or golf ball Towel or marble pick-ups to strengthen the plantar surface of the foot Weight bearing exercises to increase balance and overall stability    Proper technique shown and discussed handout in great detail with ATC.  All questions were discussed and answered.       Impression and Recommendations:     This case required medical decision making of moderate complexity.      Note: This dictation was prepared with Dragon dictation along with smaller phrase technology. Any transcriptional errors that result from this process are unintentional.

## 2016-06-02 ENCOUNTER — Encounter: Payer: Self-pay | Admitting: Family Medicine

## 2016-06-02 ENCOUNTER — Ambulatory Visit (INDEPENDENT_AMBULATORY_CARE_PROVIDER_SITE_OTHER): Payer: BC Managed Care – PPO | Admitting: Family Medicine

## 2016-06-02 ENCOUNTER — Ambulatory Visit: Payer: Self-pay

## 2016-06-02 VITALS — BP 138/84 | HR 60 | Ht 63.0 in | Wt 156.0 lb

## 2016-06-02 DIAGNOSIS — M19079 Primary osteoarthritis, unspecified ankle and foot: Secondary | ICD-10-CM

## 2016-06-02 DIAGNOSIS — M79671 Pain in right foot: Secondary | ICD-10-CM | POA: Diagnosis not present

## 2016-06-02 NOTE — Assessment & Plan Note (Signed)
Midfoot arthritis.  Discussed OTC orthotics, icing, HEP and what activities to avoid.  RTC in 4 weeks.

## 2016-06-02 NOTE — Patient Instructions (Signed)
Good to see you  Ice after activity  Avoid being barefoot.  pennsaid pinkie amount topically 2 times daily as needed.  Spenco orthotics "total support" online would be great  Good shoes with rigid bottom.  Lori Goodwin, [place]rell or New balance greater then 700 or xelero.  Tart cherry extract any dose at night Increase vitamin d to 4000 IU daily  See me again in 4 weeks.

## 2016-06-23 ENCOUNTER — Ambulatory Visit (HOSPITAL_BASED_OUTPATIENT_CLINIC_OR_DEPARTMENT_OTHER)
Admission: RE | Admit: 2016-06-23 | Discharge: 2016-06-23 | Disposition: A | Discharge status: Home / Self Care | Payer: BC Managed Care – PPO | Source: Ambulatory Visit | Attending: Internal Medicine | Admitting: Internal Medicine

## 2016-06-23 DIAGNOSIS — M85832 Other specified disorders of bone density and structure, left forearm: Secondary | ICD-10-CM | POA: Diagnosis not present

## 2016-06-23 DIAGNOSIS — Z780 Asymptomatic menopausal state: Secondary | ICD-10-CM | POA: Diagnosis not present

## 2016-06-23 DIAGNOSIS — M85851 Other specified disorders of bone density and structure, right thigh: Secondary | ICD-10-CM | POA: Insufficient documentation

## 2016-06-29 NOTE — Progress Notes (Signed)
[facility] [address] [place], [place] [address] Phone: [phone] Subjective:    I'm seeing this patient by the request  of:   Kathlene November, MD   CC: bilateral foot pain f/u  [id number]  Lori Goodwin is a 71 y.o. female coming in with complaint of bilateral foot pain. Patient was found to have midfoot arthritis bilaterally. Patient was to get over-the-counter orthotics, icing regimen, we discussed topical anti-inflammatories and vitamin D supplementation. Patient states 60% better. Not having as much pain. Feels the topical anti-inflammatory has been very helpful. Patient has been doing some icing but not as regularly as she should. As long she wears the over-the-counter orthotic she has noticed significant improvement. Patient thinks though that custom orthotics could be even more beneficial.     Past Medical History:  Diagnosis Date  . Allergic rhinitis   . Common migraine   . Hypertension    Past Surgical History:  Procedure Laterality Date  . CARPAL TUNNEL RELEASE Left 01/05/2016  . TYMPANOSTOMY TUBE PLACEMENT     Social History   Social History  . Marital status: Married    Spouse name: N/A  . Number of children: 3  . Years of education: N/A   Occupational History  . [employer]   Social History Main Topics  . Smoking status: Never Smoker  . Smokeless tobacco: Never Used  . Alcohol use No  . Drug use: No  . Sexual activity: Not Asked   Other Topics Concern  . None   Social History Narrative   [place] from [place]   Divorced, remarried   [name] her son lives in [place]     3 children ,1 son is paraplegic , has another that is a MD,other a PA     Allergies  Allergen Reactions  . Tylenol [Acetaminophen] Shortness Of Breath   Family History  Problem Relation Age of Onset  . Colon cancer Neg Hx   . Breast cancer Neg Hx   . Diabetes Neg Hx   . Heart attack Neg Hx     Past medical history,  social, surgical and family history all reviewed in electronic medical record.  No pertanent information unless stated regarding to the chief complaint.   Review of Systems: No headache, visual changes, nausea, vomiting, diarrhea, constipation, dizziness, abdominal pain, skin rash, fevers, chills, night sweats, weight loss, swollen lymph nodes, body aches, joint swelling, muscle aches, chest pain, shortness of breath, mood changes.     Objective  Blood pressure 120/82, pulse (!) 55, height 5\' 3"  (1.6 m), weight 158 lb 3.2 oz (71.8 kg), SpO2 99 %.   Systems examined below as of 06/30/16 General: NAD A&O x3 mood, affect normal  HEENT: Pupils equal, extraocular movements intact no nystagmus Respiratory: not short of breath at rest or with speaking Cardiovascular: No lower extremity edema, non tender Skin: Warm dry intact with no signs of infection or rash on extremities or on axial skeleton. Abdomen: Soft nontender, no masses Neuro: Cranial nerves  intact, neurovascularly intact in all extremities with 2+ DTRs and 2+ pulses. Lymph: No lymphadenopathy appreciated today  Gait normal with good balance and coordination.  MSK: Non tender with full range of motion and good stability and symmetric strength and tone of shoulders, elbows, wrist,  knee hips and ankles bilaterally.   Foot exam shows the patient does have breakdown of the longitudinal as well as the transverse arch. Splaying between the first and  second toes. Rigid mid foot but nontender.         Impression and Recommendations:     This case required medical decision making of moderate complexity.      Note: This dictation was prepared with Dragon dictation along with smaller phrase technology. Any transcriptional errors that result from this process are unintentional.

## 2016-06-30 ENCOUNTER — Encounter: Payer: Self-pay | Admitting: Family Medicine

## 2016-06-30 ENCOUNTER — Ambulatory Visit (INDEPENDENT_AMBULATORY_CARE_PROVIDER_SITE_OTHER): Payer: BC Managed Care – PPO | Admitting: Family Medicine

## 2016-06-30 DIAGNOSIS — R269 Unspecified abnormalities of gait and mobility: Secondary | ICD-10-CM | POA: Insufficient documentation

## 2016-06-30 MED ORDER — DICLOFENAC SODIUM 2 % TD SOLN: 2.0000 g | Freq: Two times a day (BID) | TRANSDERMAL | 3 refills | Status: DC

## 2016-06-30 NOTE — Assessment & Plan Note (Signed)
Patient does have a rigid mid foot and some mild normality gait. Patient will look and over-the-counter shoes and I do think that she is a candidate for custom orthotics. Patient will check with insurance to see how much coverage areas. Patient come back and see me if this helps. Continue conservative therapy otherwise.

## 2016-06-30 NOTE — Patient Instructions (Addendum)
Good to see you  Call your insurance for orthotics Diagnosis codes if they need it M19.079, R26.9 If you want then call us and we will get you set up for orthotics.  pennsaid pinkie amount topically 2 times daily as needed.  Xerelo shoes would be great OK to walk just make sure rigid shoes.  If we do orthotics then see me again in 2-4 weeks after

## 2016-07-01 ENCOUNTER — Telehealth: Payer: Self-pay | Admitting: Family Medicine

## 2016-07-01 NOTE — Telephone Encounter (Signed)
LMOM for pt to call the office back. °

## 2016-07-01 NOTE — Telephone Encounter (Signed)
Pt request to speak to the assistant (will not say detail of it), she saw Dr. Tamala Julian yesterday and she a question. Please give her a call back.

## 2016-07-01 NOTE — Telephone Encounter (Signed)
Continue the 4000 IU daily for another month then 2000 IU daily thereafter.  We have samples if she would like of pennsaid. Can do voltaren otherwise.

## 2016-07-01 NOTE — Telephone Encounter (Signed)
Pt called and states the medicine Diclofenac Sodium Is not covered by her insurance. Wants to know what else she can take. Last visit she was told to double her dosage of Vitamin D from 2000mg  to 4000mg  wants to know if she still needs to do this of can go abck to 2000mg .  Please advise

## 2016-07-02 NOTE — Telephone Encounter (Signed)
Called pt back and she would like the free samples. I told her I would call when they are ready to be picked up. Also told her about the Vitamin D. Thanks

## 2016-07-08 NOTE — Telephone Encounter (Signed)
Spoke with patient and placed some samples of pennsaid up front for her.

## 2016-07-08 NOTE — Telephone Encounter (Signed)
Patient states that if there are no samples she can get , she would like for something different to be sent to [facility] [address]

## 2016-07-23 ENCOUNTER — Telehealth: Payer: Self-pay | Admitting: Internal Medicine

## 2016-07-23 NOTE — Telephone Encounter (Signed)
Pt called wanting a call back about her pain

## 2016-07-23 NOTE — Telephone Encounter (Signed)
Left message for patient to return call.

## 2016-07-24 ENCOUNTER — Ambulatory Visit: Payer: BC Managed Care – PPO | Admitting: Internal Medicine

## 2016-07-24 NOTE — Telephone Encounter (Signed)
Spoke with patient. She is going to see Dr. Rolena Infante but keep her appointment with [place] for 08/18/16.

## 2016-08-18 ENCOUNTER — Ambulatory Visit: Payer: BC Managed Care – PPO | Admitting: Family Medicine

## 2016-09-11 ENCOUNTER — Telehealth: Payer: Self-pay | Admitting: Internal Medicine

## 2016-09-11 NOTE — Telephone Encounter (Signed)
Caller name: Relationship to patient: Self Can be reached: [phone]  Pharmacy:  Reason for call: Patient request call back to discuss medication that provider wanted her to take but she can't remember the name

## 2016-09-14 NOTE — Telephone Encounter (Signed)
LMOM requesting call back

## 2017-02-19 LAB — HM MAMMOGRAPHY

## 2017-02-24 ENCOUNTER — Encounter: Payer: Self-pay | Admitting: Internal Medicine

## 2017-03-05 ENCOUNTER — Ambulatory Visit: Payer: Self-pay | Admitting: *Deleted

## 2017-03-05 NOTE — Telephone Encounter (Signed)
Pt  States   She  [place]  [employer]  3   Weeks  Ago    And  Injured  Her  r  Arm  She  Has  Good  rom   And  No  Obvious  Deformity   She  Is  Able  To  Raise . She is  Able to  Extend  The  Arm and  She   Is  Able  To  Grip  Well . She  Has  Made an  Appointment   In  1  Week She  Was  Advised  To  Continue  The  otc  alleve  And  Apply heat  Every  10  mins  As  Directed . And   followup sooner if any  Problems  Reason for Disposition . Minor injury or bruising from direct blow  Answer Assessment - Initial Assessment Questions 1. MECHANISM: "How did the injury happen?"      fELL   AND  BATHROOM  AND  HIT  THE  DOOR   2. ONSET: "When did the injury happen?" (Minutes or hours ago)       APPROX   3   WEEKS  AGO 3. LOCATION: "Where is the injury located?"       R  ARM  FROM  SHOULDER  TO  ELBOW    4. APPEARANCE of INJURY: "What does the injury look like?"        NOTHING  5. SEVERITY: "Can you use the arm normally?"       FULL  rom   6. SWELLING or BRUISING: "is there any swelling or bruising?" If so, ask: "How large is it? (e.g., inches, centimeters)        No  Swelling or bruising   7. PAIN: "Is there pain?" If so, ask: "How bad is the pain?"    (Scale 1-10; or mild, moderate, severe)       mILD  PAIN  OF  3     8. TETANUS: For any breaks in the skin, ask: "When was the last tetanus booster?"     No 9. OTHER SYMPTOMS: "Do you have any other symptoms?"  (e.g., numbness in hand)     no10. PREGNANCY: "Is there any chance you are pregnant?" "When was your last menstrual period?"       NO  Protocols used: ARM INJURY-A-AH

## 2017-03-12 ENCOUNTER — Encounter: Payer: Self-pay | Admitting: Internal Medicine

## 2017-03-12 ENCOUNTER — Ambulatory Visit: Payer: BC Managed Care – PPO | Admitting: Internal Medicine

## 2017-03-12 VITALS — BP 118/68 | HR 53 | Temp 97.8°F | Resp 14 | Ht 63.0 in | Wt 155.4 lb

## 2017-03-12 DIAGNOSIS — S40029A Contusion of unspecified upper arm, initial encounter: Secondary | ICD-10-CM | POA: Diagnosis not present

## 2017-03-12 DIAGNOSIS — S40019A Contusion of unspecified shoulder, initial encounter: Secondary | ICD-10-CM

## 2017-03-12 NOTE — Progress Notes (Signed)
Pre visit review using our clinic review tool, if applicable. No additional management support is needed unless otherwise documented below in the visit note. 

## 2017-03-12 NOTE — Patient Instructions (Signed)
  IBUPROFEN (Advil or Motrin) 200 mg 2 tablets every 12 hours as needed for pain.  Always take it with food because may cause gastritis and ulcers.  If you notice nausea, stomach pain, change in the color of stools --->  Stop the medicine and let us know   Call if not gradually improving   ICE at night

## 2017-03-12 NOTE — Progress Notes (Signed)
Subjective:    Patient ID: Lori Goodwin, female    DOB: [birth year], 71 y.o.   MRN: [id number]  DOS:  03/12/2017 Type of visit - description : acute Interval history: 3 weeks ago she was taking a bath, she fell, hit her right deltoid area at the door frame.  Is having pain since. Pain is not severe, decreased with NSAIDs, worse when she do things like putting her jacket on.  Review of Systems Denies injury or pain at the head, neck or back. No lower extremity paresthesia.  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 Allergic rhinitis   . Common migraine   . Hypertension     Past Surgical History:  Procedure Laterality Date  . CARPAL TUNNEL RELEASE Left 01/05/2016  . TYMPANOSTOMY TUBE PLACEMENT      Social History   Socioeconomic History  . Marital status: Married    Spouse name: Not on file  . Number of children: 3  . Years of education: Not on file  . Highest education level: Not on file  Social Needs  . Financial resource strain: Not on file  . Food insecurity - worry: Not on file  . Food insecurity - inability: Not on file  . Transportation needs - medical: Not on file  . Transportation needs - non-medical: Not on file  Occupational History  . Occupation: Product manager: [employer]  Tobacco Use  . Smoking status: Never Smoker  . Smokeless tobacco: Never Used  Substance and Sexual Activity  . Alcohol use: No  . Drug use: No  . Sexual activity: Not on file  Other Topics Concern  . Not on file  Social History Narrative   Original from [place]   Divorced, remarried   [name] her son lives in [place]     3 children ,1 son is paraplegic , has another that is a MD,other a PA        Allergies as of 03/12/2017      Reactions   Tylenol [acetaminophen] Shortness Of Breath      Medication List        Accurate as of 03/12/17 11:59 PM. Always use your most recent med list.            Diclofenac Sodium 2 % Soln Place 2 g onto the skin 2 (two) times daily.   multivitamin with minerals Tabs tablet Take 1 tablet by mouth daily.   propranolol ER 60 MG 24 hr capsule Commonly known as:  INDERAL LA Take 1 capsule (60 mg total) by mouth daily.   SUMAtriptan 50 MG tablet Commonly known as:  IMITREX Take 1-2 tablets (50-100 mg total) by mouth as needed for migraine. May repeat in 2 hours if headache persists or recurs.   Vitamin D 2000 units Caps Take 2 capsules by mouth.          Objective:   Physical Exam BP 118/68 (BP Location: Left Arm, Patient Position: Sitting, Cuff Size: Small)   Pulse (!) 53   Temp 97.8 F (36.6 C) (Oral)   Resp 14   Ht 5\' 3"  (1.6 m)   Wt 155 lb 6 oz (70.5 kg)   SpO2 98%   BMI 27.52 kg/m  General:   Well developed, well nourished . NAD.  HEENT:  Normocephalic . Face symmetric, atraumatic Neck: No TTP, range of motion normal MSK: Left shoulder normal Right shoulder: No deformities, not  really TTP at the [facility] joint ,  mild TTP at deltoid area Range of motion is normal.  Skin: Not pale. Not jaundice Neurologic:  alert & oriented X3.  Speech normal, gait appropriate for age and unassisted Psych--  Cognition and judgment appear intact.  Cooperative with normal attention span and concentration.  Behavior appropriate. No anxious or depressed appearing.      Assessment & Plan:   Assessment  Prediabetes: A1c 6.0 (03-2015) HTN (on BB) MSK: --DJD --Neck pain: Saw neurosurgery, MRI was done, DX DJD, nonsurgical candidate (2013 was ( --s/p L CTS surgery  Migraines  (onn BB and imitrex) Ear canal eczema: topical steroids prn  PLAN: Shoulder contusion: Low suspicious for internal derangement, rx to give it more time, try ice at night and ibuprofen, GI precautions discussed.  She will call if not better, sports medicine referral?

## 2017-03-14 NOTE — Assessment & Plan Note (Signed)
Shoulder contusion: Low suspicious for internal derangement, rx to give it more time, try ice at night and ibuprofen, GI precautions discussed.  She will call if not better, sports medicine referral?

## 2017-05-06 ENCOUNTER — Other Ambulatory Visit: Payer: Self-pay | Admitting: Internal Medicine

## 2017-06-16 ENCOUNTER — Other Ambulatory Visit: Payer: Self-pay | Admitting: Internal Medicine

## 2017-06-29 ENCOUNTER — Encounter: Payer: Self-pay | Admitting: Internal Medicine

## 2017-06-29 ENCOUNTER — Ambulatory Visit: Payer: BC Managed Care – PPO | Admitting: Internal Medicine

## 2017-06-29 VITALS — BP 132/80 | HR 83 | Temp 98.0°F | Resp 16 | Ht 63.0 in | Wt 154.5 lb

## 2017-06-29 DIAGNOSIS — R059 Cough, unspecified: Secondary | ICD-10-CM

## 2017-06-29 DIAGNOSIS — R05 Cough: Secondary | ICD-10-CM

## 2017-06-29 MED ORDER — BENZONATATE 100 MG PO CAPS: 100.0000 mg | ORAL_CAPSULE | Freq: Three times a day (TID) | ORAL | 0 refills | Status: DC | PRN

## 2017-06-29 NOTE — Progress Notes (Signed)
Pre visit review using our clinic review tool, if applicable. No additional management support is needed unless otherwise documented below in the visit note. 

## 2017-06-29 NOTE — Patient Instructions (Addendum)
Please  scheduled to her physical at your convenience   Rest, fluids  For cough:  Take Mucinex DM twice a day as needed until better If the cough continue, take Tessalon Perles 3 times a day   For nasal congestion: Use OTC Nasocort or Flonase : 2 nasal sprays on each side of the nose in the morning until you feel better  Avoid decongestants such as  Pseudoephedrine or phenylephrine    Call if not gradually better over the next  10 days  Call anytime if the symptoms are severe

## 2017-06-29 NOTE — Progress Notes (Signed)
Subjective:    Patient ID: Lori Goodwin, female    DOB: 08-15-1945, 72 y.o.   MRN: [id number]  DOS:  06/29/2017 Type of visit - description : Acute visit Interval history: Symptoms started 10 days ago with a "cold".  She developed cough, yellowish sputum production, symptoms increased when she talks. Taking Mucinex w/ mild help.   Review of Systems Denies fever chills No nausea or vomiting.  No diarrhea Admits to mild sinus congestion mostly in the forehead, blowing a very small amount of mucus. No sore throat  Past Medical History:  Diagnosis Date  . Allergic rhinitis   . Common migraine   . Hypertension     Past Surgical History:  Procedure Laterality Date  . CARPAL TUNNEL RELEASE Left 01/05/2016  . TYMPANOSTOMY TUBE PLACEMENT      Social History   Socioeconomic History  . Marital status: Married    Spouse name: Not on file  . Number of children: 3  . Years of education: Not on file  . Highest education level: Not on file  Occupational History  . Occupation: Product manager: [employer]  Social Needs  . Financial resource strain: Not on file  . Food insecurity:    Worry: Not on file    Inability: Not on file  . Transportation needs:    Medical: Not on file    Non-medical: Not on file  Tobacco Use  . Smoking status: Never Smoker  . Smokeless tobacco: Never Used  Substance and Sexual Activity  . Alcohol use: No  . Drug use: No  . Sexual activity: Not on file  Lifestyle  . Physical activity:    Days per week: Not on file    Minutes per session: Not on file  . Stress: Not on file  Relationships  . Social connections:    Talks on phone: Not on file    Gets together: Not on file    Attends religious service: Not on file    Active member of club or organization: Not on file    Attends meetings of clubs or organizations: Not on file    Relationship status: Not on file  . Intimate partner violence:    Fear of current or ex partner:  Not on file    Emotionally abused: Not on file    Physically abused: Not on file    Forced sexual activity: Not on file  Other Topics Concern  . Not on file  Social History Narrative   [place] from [place]   Divorced, remarried   [name] her son lives in [place]     3 children ,1 son is paraplegic , has another that is a MD,other a PA        Allergies as of 06/29/2017      Reactions   Tylenol [acetaminophen] Shortness Of Breath      Medication List        Accurate as of 06/29/17 11:59 PM. Always use your most recent med list.          benzonatate 100 MG capsule Commonly known as:  TESSALON PERLES Take 1 capsule (100 mg total) by mouth 3 (three) times daily as needed for cough.   Diclofenac Sodium 2 % Soln Place 2 g onto the skin 2 (two) times daily.   multivitamin with minerals Tabs tablet Take 1 tablet by mouth daily.   propranolol ER 60 MG 24 hr capsule Commonly known as:  INDERAL LA  Take 1 capsule (60 mg total) by mouth daily.   SUMAtriptan 50 MG tablet Commonly known as:  IMITREX TAKE 1-2 TABLETS BY MOUTH AS NEEDED FOR MIGRAINE. MAY REPEAT IN 2 HOURS IF HEADACHE PERSISTS OR RECURS   Vitamin D 2000 units Caps Take 2 capsules by mouth.          Objective:   Physical Exam BP 132/80 (BP Location: Left Arm, Patient Position: Sitting, Cuff Size: Small)   Pulse 83   Temp 98 F (36.7 C) (Oral)   Resp 16   Ht 5\' 3"  (1.6 m)   Wt 154 lb 8 oz (70.1 kg)   SpO2 97%   BMI 27.37 kg/m  General:   Well developed, well nourished . NAD.  HEENT:  Normocephalic . Face symmetric, atraumatic. Nose is slightly congested, sinuses no TTP.  Throat symmetric and not red  ; TMs obscure by dry wax Lungs:  CTA B Normal respiratory effort, no intercostal retractions, no accessory muscle use. Heart: RRR,  no murmur.  No pretibial edema bilaterally  Skin: Not pale. Not jaundice Neurologic:  alert & oriented X3.  Speech normal, gait appropriate for age and  unassisted Psych--  Cognition and judgment appear intact.  Cooperative with normal attention span and concentration.  Behavior appropriate. No anxious or depressed appearing.      Assessment & Plan:   Assessment  Prediabetes: A1c 6.0 (03-2015) HTN (on BB) MSK: --DJD --Neck pain: Saw neurosurgery, MRI was done, DX DJD, nonsurgical candidate (2013 was ( --s/p L CTS surgery  Migraines  (onn BB and imitrex) Ear canal eczema: topical steroids prn  PLAN: Cough: Lingering cough after a URI, exam is benign.  Rec symptomatic treatment and give it more time.  Continue Mucinex DM, add Tessalon and also Flonase. To call if not improving soon, consider antibiotics.

## 2017-06-30 NOTE — Assessment & Plan Note (Signed)
Cough: Lingering cough after a URI, exam is benign.  Rec symptomatic treatment and give it more time.  Continue Mucinex DM, add Tessalon and also Flonase. To call if not improving soon, consider antibiotics.

## 2017-07-28 ENCOUNTER — Telehealth: Payer: Self-pay | Admitting: Medical

## 2017-07-28 ENCOUNTER — Encounter: Payer: Self-pay | Admitting: Medical

## 2017-07-28 ENCOUNTER — Telehealth: Payer: Self-pay

## 2017-07-28 ENCOUNTER — Ambulatory Visit: Payer: BC Managed Care – PPO | Admitting: Medical

## 2017-07-28 ENCOUNTER — Telehealth: Payer: Self-pay | Admitting: Internal Medicine

## 2017-07-28 VITALS — BP 141/56 | HR 56 | Temp 98.2°F | Resp 16 | Ht 63.0 in | Wt 155.4 lb

## 2017-07-28 DIAGNOSIS — R059 Cough, unspecified: Secondary | ICD-10-CM

## 2017-07-28 DIAGNOSIS — J301 Allergic rhinitis due to pollen: Secondary | ICD-10-CM | POA: Diagnosis not present

## 2017-07-28 DIAGNOSIS — R05 Cough: Secondary | ICD-10-CM | POA: Diagnosis not present

## 2017-07-28 MED ORDER — HYDROCODONE-HOMATROPINE 5-1.5 MG/5ML PO SYRP: 5.0000 mL | ORAL_SOLUTION | Freq: Four times a day (QID) | ORAL | 0 refills | Status: DC | PRN

## 2017-07-28 MED ORDER — LEVOCETIRIZINE DIHYDROCHLORIDE 5 MG PO TABS: 5.0000 mg | ORAL_TABLET | Freq: Every evening | ORAL | 2 refills | Status: DC

## 2017-07-28 MED ORDER — AZITHROMYCIN 250 MG PO TABS: ORAL_TABLET | ORAL | 0 refills | Status: DC

## 2017-07-28 NOTE — Telephone Encounter (Signed)
Called pt and informed her she would have to be seen for a med refill for her cough. Scheduled an appt with Mackie Pai for today at 3:00 pm.

## 2017-07-28 NOTE — Telephone Encounter (Signed)
Pt. Has an appointment for today.

## 2017-07-28 NOTE — Telephone Encounter (Signed)
Copied from [place] [id number] [phone]. Topic: Quick Communication - See Telephone Encounter >> Jul 28, 2017  7:11 AM Hewitt Shorts wrote: CRM for notification. See Telephone encounter for: 07/28/17.pt is calling stating that she would like a cough medication the cough she was seen in march and she states it went away but has returned and like a refill  Best number [phone]  >> Jul 28, 2017 11:15 AM Selinda Flavin B, NT wrote: Patient states that she has a cough and some drainage causing a sore throat. States that she is wanting to know if and antibiotic can be sent to the pharmacy or if she can be fit into the schedule today (07/28/17). CB#: [phone]

## 2017-07-28 NOTE — Telephone Encounter (Signed)
Pt needs appt before any medication can be sent. PCP full. She may need to be seen at another [place] location or seen tomorrow.

## 2017-07-28 NOTE — Progress Notes (Signed)
Subjective:    Patient ID: Lori Goodwin, female    DOB: February 13, 1946, 72 y.o.   MRN: [id number]  HPI   Pt in with cough for about one week. The cough is worse. Cough during the day as well. No wheezing heard.   No fever, no chills or sweats.  No sneezing, no itching eyes or runny nose. Pt feels some pnd. Some nasal congestion. She tried claritin but did not help much.  Pt cough is mostly dry but occasional will get yellow mucus.  Pt states last night could not sleep due to cough.  Pt tried benzonatate last night and it did stop cough.   Review of Systems  Constitutional: Negative for chills, fatigue and fever.  HENT: Positive for congestion and postnasal drip. Negative for rhinorrhea, sinus pressure, sinus pain and sneezing.        Faint st.  Respiratory: Positive for cough. Negative for chest tightness, shortness of breath and wheezing.   Cardiovascular: Negative for chest pain and palpitations.  Musculoskeletal: Negative for back pain and neck pain.  Skin: Negative for rash.  Hematological: Negative for adenopathy. Does not bruise/bleed easily.  Psychiatric/Behavioral: Negative for behavioral problems.    Past Medical History:  Diagnosis Date  . Allergic rhinitis   . Common migraine   . Hypertension      Social History   Socioeconomic History  . Marital status: Married    Spouse name: Not on file  . Number of children: 3  . Years of education: Not on file  . Highest education level: Not on file  Occupational History  . Occupation: Product manager: [employer]  Social Needs  . Financial resource strain: Not on file  . Food insecurity:    Worry: Not on file    Inability: Not on file  . Transportation needs:    Medical: Not on file    Non-medical: Not on file  Tobacco Use  . Smoking status: Never Smoker  . Smokeless tobacco: Never Used  Substance and Sexual Activity  . Alcohol use: No  . Drug use: No  . Sexual activity: Not on file    Lifestyle  . Physical activity:    Days per week: Not on file    Minutes per session: Not on file  . Stress: Not on file  Relationships  . Social connections:    Talks on phone: Not on file    Gets together: Not on file    Attends religious service: Not on file    Active member of club or organization: Not on file    Attends meetings of clubs or organizations: Not on file    Relationship status: Not on file  . Intimate partner violence:    Fear of current or ex partner: Not on file    Emotionally abused: Not on file    Physically abused: Not on file    Forced sexual activity: Not on file  Other Topics Concern  . Not on file  Social History Narrative   [place]   Divorced, remarried   [name] her son lives in [place]     3 children ,1 son is paraplegic , has another that is a MD,other a PA      Past Surgical History:  Procedure Laterality Date  . CARPAL TUNNEL RELEASE Left 01/05/2016  . TYMPANOSTOMY TUBE PLACEMENT      Family History  Problem Relation Age of Onset  . Colon cancer Neg  Hx   . Breast cancer Neg Hx   . Diabetes Neg Hx   . Heart attack Neg Hx     Allergies  Allergen Reactions  . Tylenol [Acetaminophen] Shortness Of Breath    Current Outpatient Medications on File Prior to Visit  Medication Sig Dispense Refill  . benzonatate (TESSALON PERLES) 100 MG capsule Take 1 capsule (100 mg total) by mouth 3 (three) times daily as needed for cough. 20 capsule 0  . Cholecalciferol (VITAMIN D) 2000 units CAPS Take 2 capsules by mouth.    . Diclofenac Sodium 2 % SOLN Place 2 g onto the skin 2 (two) times daily. 112 g 3  . Multiple Vitamin (MULTIVITAMIN WITH MINERALS) TABS Take 1 tablet by mouth daily.    . propranolol ER (INDERAL LA) 60 MG 24 hr capsule Take 1 capsule (60 mg total) by mouth daily. 90 capsule 0  . SUMAtriptan (IMITREX) 50 MG tablet TAKE 1-2 TABLETS BY MOUTH AS NEEDED FOR MIGRAINE. MAY REPEAT IN 2 HOURS IF HEADACHE PERSISTS OR RECURS 30  tablet 0   No current facility-administered medications on file prior to visit.     Pulse (!) 56   Temp 98.2 F (36.8 C) (Oral)   Resp 16   Ht 5\' 3"  (1.6 m)   Wt 155 lb 6.4 oz (70.5 kg)   SpO2 99%   BMI 27.53 kg/m       Objective:   Physical Exam   General  Mental Status - Alert. General Appearance - Well groomed. Not in acute distress.  Skin Rashes- No Rashes.  HEENT Head- Normal. Ear Auditory Canal - Left- Normal. Right - Normal.Tympanic Membrane- Left- Normal. Right- Normal. Eye Sclera/Conjunctiva- Left- Normal. Right- Normal. Nose & Sinuses Nasal Mucosa- Left-  Boggy and Congested. Right-  Boggy and  Congested.Bilateral no  maxillary and no  frontal sinus pressure. Mouth & Throat Lips: Upper Lip- Normal: no dryness, cracking, pallor, cyanosis, or vesicular eruption. Lower Lip-Normal: no dryness, cracking, pallor, cyanosis or vesicular eruption. Buccal Mucosa- Bilateral- No Aphthous ulcers. Oropharynx- No Discharge or Erythema. +pnd. Tonsils: Characteristics- Bilateral- No Erythema or Congestion. Size/Enlargement- Bilateral- No enlargement. Discharge- bilateral-None.  Neck Neck- Supple. No Masses.   Chest and Lung Exam Auscultation: Breath Sounds:-Clear even and unlabored.  Cardiovascular Auscultation:Rythm- Regular, rate and rhythm. Murmurs & Other Heart Sounds:Ausculatation of the heart reveal- No Murmurs.  Lymphatic Head & Neck General Head & Neck Lymphatics: Bilateral: Description- No Localized lymphadenopathy.      Assessment & Plan:  Probable allergic rhinitis signs and symptoms with  associated cough.    I am prescribing Xyzal antihistamine and  want you to use  your Flonase nasal spray.   Since you do describe severe cough keeping you up at night, I did prescribe Hycodan.  Rx advisement given.  You might just want to use Hycodan at night due to sedation side effects.     Some occasional intermittent productive cough at times.   Not  convinced that you need antibiotic presently.  However if you have worse productive cough, chest congestion or sinus pain then can start a azithromycin printed prescription that I am making available.  Follow-up in 7 days or as needed.  Mackie Pai, PA-C

## 2017-07-28 NOTE — Patient Instructions (Addendum)
Probable allergic rhinitis signs and symptoms with  associated cough.    I am prescribing Xyzal antihistamine and  want you to use  your Flonase nasal spray.  Since you do describe severe cough keeping you up at night, I did prescribe Hycodan.  Rx advisement given.  You might just want to use Hycodan at night due to sedation side effects.     Some occasional intermittent productive cough at times.   Not convinced that you need antibiotic presently.  However if you have worse productive cough, chest congestion or sinus pain then can start a azithromycin printed prescription that I am making available.  Follow-up in 7 days or as needed.

## 2017-07-28 NOTE — Telephone Encounter (Signed)
Please look for patient face sheet in my shred bin or remind me. Blood pressure not documented. I know you checked it. Also please document pt height on each visit. Epic keeps reminding me height not checked on various patients. I have to go to prior visit to find out height.thanks.

## 2017-07-28 NOTE — Telephone Encounter (Signed)
Copied from [place] [id number]. Topic: Quick Communication - See Telephone Encounter >> Jul 28, 2017  7:11 AM Hewitt Shorts wrote: CRM for notification. See Telephone encounter for: 07/28/17.pt is calling stating that she would like a cough medication the cough she was seen in march and she states it went away but has returned and like a refill  Best number [phone]

## 2017-08-10 ENCOUNTER — Other Ambulatory Visit: Payer: Self-pay | Admitting: Internal Medicine

## 2017-09-13 ENCOUNTER — Encounter: Payer: BC Managed Care – PPO | Admitting: Internal Medicine

## 2017-09-20 ENCOUNTER — Ambulatory Visit (INDEPENDENT_AMBULATORY_CARE_PROVIDER_SITE_OTHER): Payer: BC Managed Care – PPO | Admitting: Internal Medicine

## 2017-09-20 ENCOUNTER — Encounter: Payer: Self-pay | Admitting: Internal Medicine

## 2017-09-20 VITALS — BP 116/66 | HR 52 | Temp 97.7°F | Resp 16 | Ht 63.0 in | Wt 156.5 lb

## 2017-09-20 DIAGNOSIS — Z0000 Encounter for general adult medical examination without abnormal findings: Secondary | ICD-10-CM

## 2017-09-20 DIAGNOSIS — R739 Hyperglycemia, unspecified: Secondary | ICD-10-CM | POA: Diagnosis not present

## 2017-09-20 NOTE — Progress Notes (Signed)
Subjective:    Patient ID: Lori Goodwin, female    DOB: March 05, 1946, 72 y.o.   MRN: [id number]  DOS:  09/20/2017 Type of visit - description : cpx Interval history: No major concerns  Review of Systems Occasionally has environmental allergies, on OTCs as needed Occasionally has pain at the left buttock when she sits for prolonged periods of time, no radiation, symptoms decreased after she walks a little.  Other than above, a 14 point review of systems is negative     Past Medical History:  Diagnosis Date  . Allergic rhinitis   . Common migraine   . Hypertension     Past Surgical History:  Procedure Laterality Date  . CARPAL TUNNEL RELEASE Left 01/05/2016  . TYMPANOSTOMY TUBE PLACEMENT      Social History   Socioeconomic History  . Marital status: Married    Spouse name: Not on file  . Number of children: 3  . Years of education: Not on file  . Highest education level: Not on file  Occupational History  . Occupation: [name], 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    Employer: [employer]  Social Needs  . Financial resource strain: Not on file  . Food insecurity:    Worry: Not on file    Inability: Not on file  . Transportation needs:    Medical: Not on file    Non-medical: Not on file  Tobacco Use  . Smoking status: Never Smoker  . Smokeless tobacco: Never Used  Substance and Sexual Activity  . Alcohol use: No  . Drug use: No  . Sexual activity: Not on file  Lifestyle  . Physical activity:    Days per week: Not on file    Minutes per session: Not on file  . Stress: Not on file  Relationships  . Social connections:    Talks on phone: Not on file    Gets together: Not on file    Attends religious service: Not on file    Active member of club or organization: Not on file    Attends meetings of clubs or organizations: Not on file    Relationship status: Not on file  . Intimate partner violence:    Fear of current or ex partner: Not on file    Emotionally  abused: Not on file    Physically abused: Not on file    Forced sexual activity: Not on file  Other Topics Concern  . Not on file  Social History Narrative   [place]   Divorced, remarried   Lives w/ husband   Lori Goodwin her son lives in [place]     3 children ,1 son is paraplegic , has another that is a MD,other a PA       Family History  Problem Relation Age of Onset  . Colon cancer Neg Hx   . Breast cancer Neg Hx   . Diabetes Neg Hx   . Heart attack Neg Hx      Allergies as of 09/20/2017      Reactions   Tylenol [acetaminophen] Shortness Of Breath      Medication List        Accurate as of 09/20/17 11:59 PM. Always use your most recent med list.          multivitamin with minerals Tabs tablet Take 1 tablet by mouth daily.   propranolol ER 60 MG 24 hr capsule Commonly known as:  INDERAL LA Take 1 capsule (60 mg total)  by mouth daily.   SUMAtriptan 50 MG tablet Commonly known as:  IMITREX TAKE 1-2 TABLETS BY MOUTH AS NEEDED FOR MIGRAINE. MAY REPEAT IN 2 HOURS IF HEADACHE PERSISTS OR RECURS   Vitamin D 2000 units Caps Take 2 capsules by mouth.          Objective:   Physical Exam BP 116/66 (BP Location: Left Arm, Patient Position: Sitting, Cuff Size: Small)   Pulse (!) 52   Temp 97.7 F (36.5 C) (Oral)   Resp 16   Ht 5\' 3"  (1.6 m)   Wt 156 lb 8 oz (71 kg)   SpO2 98%   BMI 27.72 kg/m  General: Well developed, NAD, see BMI.  Neck: No  thyromegaly  HEENT:  Normocephalic . Face symmetric, atraumatic Lungs:  CTA B Normal respiratory effort, no intercostal retractions, no accessory muscle use. Heart: RRR,  no murmur.  No pretibial edema bilaterally  Abdomen:  Not distended, soft, non-tender. No rebound or rigidity.   Skin: Exposed areas without rash. Not pale. Not jaundice MSK: Hip rotation normal Neurologic:  alert & oriented X3.  Speech normal, gait appropriate for age and unassisted Strength symmetric and appropriate for age.    Psych: Cognition and judgment appear intact.  Cooperative with normal attention span and concentration.  Behavior appropriate. No anxious or depressed appearing.     Assessment & Plan:  Assessment  Prediabetes: A1c 6.0 (03-2015) HTN (on BB) MSK: --DJD --Neck pain: Saw neurosurgery, MRI was done, DX DJD, nonsurgical candidate (2013 was ( --s/p L CTS surgery  Osteopenia first  T score -1.2 (06/2017) rx ca and vit D  Migraines  (onn BB and imitrex) Ear canal eczema: topical steroids prn  PLAN: Prediabetes: Check a A1c, continue with her healthy lifestyle HTN: Seems well controlled, continue beta-blockers DJD: Minimal symptoms.   CTS: Having occasional paresthesias, right hand, she thinks is from CTS, uses a brace at night.  Thinking about surgery, advised to definitely consult surgery if symptoms  persistent because the risk of permanent damage.  She verbalized understanding. Osteopenia: Good compliance with calcium and vitamin D. RTC 1 year

## 2017-09-20 NOTE — Assessment & Plan Note (Signed)
--  Td 2011; Pneumonia shot: 2015; Prevnar-- 2016; zostavax 03-2015 ; shingrix not available  --Female care: At [facility] Dr Philis Pique, was told no PAP needed (2018), [facility] 02/2017 per [facility] --QPE:[id number] 01-2015, + polyps, Dr Ardis Hughs, 5 years  --Labs : Will come back fasting for CMP, FLP, CBC, A1c --Diet and exercise discussed

## 2017-09-20 NOTE — Progress Notes (Signed)
Pre visit review using our clinic review tool, if applicable. No additional management support is needed unless otherwise documented below in the visit note. 

## 2017-09-20 NOTE — Patient Instructions (Signed)
  GO TO THE FRONT DESK Schedule your next appointment for a  Physical exam in 1 year  Schedule labs to be done this week, fasting

## 2017-09-21 ENCOUNTER — Other Ambulatory Visit (INDEPENDENT_AMBULATORY_CARE_PROVIDER_SITE_OTHER): Payer: BC Managed Care – PPO

## 2017-09-21 DIAGNOSIS — Z0000 Encounter for general adult medical examination without abnormal findings: Secondary | ICD-10-CM

## 2017-09-21 DIAGNOSIS — R739 Hyperglycemia, unspecified: Secondary | ICD-10-CM

## 2017-09-21 LAB — COMPREHENSIVE METABOLIC PANEL
ALT: 17 U/L (ref 0–35)
AST: 17 U/L (ref 0–37)
Albumin: 4.3 g/dL (ref 3.5–5.2)
Alkaline Phosphatase: 59 U/L (ref 39–117)
BUN: 20 mg/dL (ref 6–23)
CO2: 29 meq/L (ref 19–32)
Calcium: 9.4 mg/dL (ref 8.4–10.5)
Chloride: 104 mEq/L (ref 96–112)
Creatinine, Ser: 0.75 mg/dL (ref 0.40–1.20)
GFR: 80.93 mL/min (ref >60.00)
GLUCOSE: 111 mg/dL — AB (ref 70–99)
POTASSIUM: 4.3 meq/L (ref 3.5–5.1)
Sodium: 140 mEq/L (ref 135–145)
TOTAL PROTEIN: 6.6 g/dL (ref 6.0–8.3)
Total Bilirubin: 0.4 mg/dL (ref 0.2–1.2)

## 2017-09-21 LAB — LIPID PANEL
CHOL/HDL RATIO: 3
Cholesterol: 181 mg/dL (ref 0–200)
HDL: 56.1 mg/dL (ref >39.00)
LDL Cholesterol: 107 mg/dL — ABNORMAL HIGH (ref 0–99)
NonHDL: 124.74
Triglycerides: 90 mg/dL (ref 0.0–149.0)
VLDL: 18 mg/dL (ref 0.0–40.0)

## 2017-09-21 LAB — CBC WITH DIFFERENTIAL/PLATELET
Basophils Absolute: 0 10*3/uL (ref 0.0–0.1)
Basophils Relative: 0.9 % (ref 0.0–3.0)
Eosinophils Absolute: 0.3 10*3/uL (ref 0.0–0.7)
Eosinophils Relative: 4.8 % (ref 0.0–5.0)
HEMATOCRIT: 37.4 % (ref 36.0–46.0)
Hemoglobin: 12.9 g/dL (ref 12.0–15.0)
LYMPHS PCT: 40.6 % (ref 12.0–46.0)
Lymphs Abs: 2.1 10*3/uL (ref 0.7–4.0)
MCHC: 34.5 g/dL (ref 30.0–36.0)
MCV: 91.7 fl (ref 78.0–100.0)
MONOS PCT: 6.4 % (ref 3.0–12.0)
Monocytes Absolute: 0.3 10*3/uL (ref 0.1–1.0)
Neutro Abs: 2.5 10*3/uL (ref 1.4–7.7)
Neutrophils Relative %: 47.3 % (ref 43.0–77.0)
Platelets: 223 10*3/uL (ref 150.0–400.0)
RBC: 4.08 Mil/uL (ref 3.87–5.11)
RDW: 12.7 % (ref 11.5–15.5)
WBC: 5.2 10*3/uL (ref 4.0–10.5)

## 2017-09-21 LAB — HEMOGLOBIN A1C: HEMOGLOBIN A1C: 6.1 % (ref 4.6–6.5)

## 2017-09-21 NOTE — Assessment & Plan Note (Signed)
Prediabetes: Check a A1c, continue with her healthy lifestyle HTN: Seems well controlled, continue beta-blockers DJD: Minimal symptoms.   CTS: Having occasional paresthesias, right hand, she thinks is from CTS, uses a brace at night.  Thinking about surgery, advised to definitely consult surgery if symptoms  persistent because the risk of permanent damage.  She verbalized understanding. Osteopenia: Good compliance with calcium and vitamin D. RTC 1 year

## 2017-10-01 ENCOUNTER — Telehealth: Payer: Self-pay | Admitting: Internal Medicine

## 2017-10-01 NOTE — Telephone Encounter (Signed)
Do you have any advice regarding Pt's last blood sugar, and cholesterol for her diet?

## 2017-10-01 NOTE — Telephone Encounter (Signed)
Her blood sugar and cholesterol look very good, I always recommend healthy diet. More information can be found for free at the American Heart Association and American diabetes Association websites. If she needs even more information, arrange a nutritionist referral.

## 2017-10-01 NOTE — Telephone Encounter (Signed)
Sent to wrong office °

## 2017-10-01 NOTE — Telephone Encounter (Signed)
Spoke w/ Pt- informed of PCP recommendations. Pt verbalized understanding.  

## 2017-10-01 NOTE — Telephone Encounter (Signed)
Copied from [place] [phone] [id number]. Topic: Quick Communication - See Telephone Encounter >> Oct 01, 2017 10:18 AM Boyd Kerbs wrote: CRM for notification. See Telephone encounter for: 10/01/17.  Blood work done - sent report to her-  asking about what to do since her cholesterol and glucose to help herself

## 2017-11-02 ENCOUNTER — Ambulatory Visit: Payer: BC Managed Care – PPO | Admitting: Internal Medicine

## 2018-01-25 DIAGNOSIS — G5603 Carpal tunnel syndrome, bilateral upper limbs: Secondary | ICD-10-CM | POA: Diagnosis not present

## 2018-01-25 DIAGNOSIS — Z9889 Other specified postprocedural states: Secondary | ICD-10-CM | POA: Diagnosis not present

## 2018-01-25 DIAGNOSIS — Z6828 Body mass index (BMI) 28.0-28.9, adult: Secondary | ICD-10-CM | POA: Diagnosis not present

## 2018-01-25 DIAGNOSIS — R030 Elevated blood-pressure reading, without diagnosis of hypertension: Secondary | ICD-10-CM | POA: Diagnosis not present

## 2018-02-07 ENCOUNTER — Other Ambulatory Visit: Payer: Self-pay | Admitting: Internal Medicine

## 2018-02-22 DIAGNOSIS — Z1231 Encounter for screening mammogram for malignant neoplasm of breast: Secondary | ICD-10-CM | POA: Diagnosis not present

## 2018-02-22 DIAGNOSIS — Z124 Encounter for screening for malignant neoplasm of cervix: Secondary | ICD-10-CM | POA: Diagnosis not present

## 2018-02-22 DIAGNOSIS — Z01419 Encounter for gynecological examination (general) (routine) without abnormal findings: Secondary | ICD-10-CM | POA: Diagnosis not present

## 2018-02-22 DIAGNOSIS — Z6827 Body mass index (BMI) 27.0-27.9, adult: Secondary | ICD-10-CM | POA: Diagnosis not present

## 2018-02-22 LAB — HM MAMMOGRAPHY

## 2018-02-22 LAB — HM PAP SMEAR

## 2018-03-14 ENCOUNTER — Encounter: Payer: Self-pay | Admitting: Internal Medicine

## 2018-04-06 HISTORY — PX: CARPAL TUNNEL RELEASE: SHX101

## 2018-05-04 DIAGNOSIS — G5603 Carpal tunnel syndrome, bilateral upper limbs: Secondary | ICD-10-CM | POA: Diagnosis not present

## 2018-05-04 DIAGNOSIS — G5601 Carpal tunnel syndrome, right upper limb: Secondary | ICD-10-CM | POA: Diagnosis not present

## 2018-05-08 ENCOUNTER — Other Ambulatory Visit: Payer: Self-pay | Admitting: Internal Medicine

## 2018-05-09 ENCOUNTER — Encounter: Payer: Self-pay | Admitting: Internal Medicine

## 2018-05-09 ENCOUNTER — Ambulatory Visit (INDEPENDENT_AMBULATORY_CARE_PROVIDER_SITE_OTHER): Payer: Medicare Other | Admitting: Internal Medicine

## 2018-05-09 VITALS — BP 116/84 | HR 48 | Temp 98.1°F | Resp 16 | Ht 63.0 in | Wt 155.1 lb

## 2018-05-09 DIAGNOSIS — I10 Essential (primary) hypertension: Secondary | ICD-10-CM | POA: Diagnosis not present

## 2018-05-09 NOTE — Patient Instructions (Addendum)
Please schedule Medicare Wellness with Glenard Haring.    Check the  blood pressure 2   times a month   Be sure your blood pressure is between 110/65 and  135/85. If it is consistently higher or lower, let me know   HOW TO TAKE YOUR BLOOD PRESSURE:   Rest 5 minutes before taking your blood pressure.   Don't smoke or drink caffeinated beverages for at least 30 minutes before.   Take your blood pressure before (not after) you eat.   Sit comfortably with your back supported and both feet on the floor (don't cross your legs).   Elevate your arm to heart level on a table or a desk.   Use the proper sized cuff. It should fit smoothly and snugly around your bare upper arm. There should be enough room to slip a fingertip under the cuff. The bottom edge of the cuff should be 1 inch above the crease of the elbow.   Ideally, take 3 measurements at one sitting and record the average.

## 2018-05-09 NOTE — Progress Notes (Signed)
Subjective:    Patient ID: Lori Goodwin, female    DOB: June 05, 1945, 73 y.o.   MRN: [id number]  DOS:  05/09/2018 Type of visit - description: acute Concerned about her blood pressure. Last week had carpal tunnel syndrome, prior to surgery BPs was 170. Reports good compliance with beta-blockers which she actually takes for migraines.  BP Readings from Last 3 Encounters:  05/09/18 116/84  09/20/17 116/66  07/28/17 (!) 141/56     Review of Systems  Denies chest pain no difficulty breathing No lower extremity edema No taking excessive NSAIDs No headache or dizziness Migraines are well controlled  Past Medical History:  Diagnosis Date  . Allergic rhinitis   . Common migraine   . Hypertension     Past Surgical History:  Procedure Laterality Date  . CARPAL TUNNEL RELEASE Left 01/05/2016  . CARPAL TUNNEL RELEASE Right   . TYMPANOSTOMY TUBE PLACEMENT      Social History   Socioeconomic History  . Marital status: Married    Spouse name: Not on file  . Number of children: 3  . Years of education: Not on file  . Highest education level: Not on file  Occupational History  . Occupation: [name], 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    Employer: [employer]  Social Needs  . Financial resource strain: Not on file  . Food insecurity:    Worry: Not on file    Inability: Not on file  . Transportation needs:    Medical: Not on file    Non-medical: Not on file  Tobacco Use  . Smoking status: Never Smoker  . Smokeless tobacco: Never Used  Substance and Sexual Activity  . Alcohol use: No  . Drug use: No  . Sexual activity: Not on file  Lifestyle  . Physical activity:    Days per week: Not on file    Minutes per session: Not on file  . Stress: Not on file  Relationships  . Social connections:    Talks on phone: Not on file    Gets together: Not on file    Attends religious service: Not on file    Active member of club or organization: Not on file    Attends meetings of  clubs or organizations: Not on file    Relationship status: Not on file  . Intimate partner violence:    Fear of current or ex partner: Not on file    Emotionally abused: Not on file    Physically abused: Not on file    Forced sexual activity: Not on file  Other Topics Concern  . Not on file  Social History Narrative   [place] from [place]   Divorced, remarried   Lives w/ husband   [name] her son lives in [place]     3 children ,1 son is paraplegic , has another that is a MD,other a PA        Allergies as of 05/09/2018      Reactions   Tylenol [acetaminophen] Shortness Of Breath      Medication List       Accurate as of May 09, 2018  3:44 PM. Always use your most recent med list.        multivitamin with minerals Tabs tablet Take 1 tablet by mouth daily.   propranolol ER 60 MG 24 hr capsule Commonly known as:  INDERAL LA Take 1 capsule (60 mg total) by mouth daily.   SUMAtriptan 50 MG tablet Commonly known as:  IMITREX TAKE 1-2 TABLETS BY MOUTH AS NEEDED FOR MIGRAINE. MAY REPEAT IN 2 HOURS IF HEADACHE PERSISTS OR RECURS   Vitamin D 50 MCG (2000 UT) Caps Take 2 capsules by mouth.           Objective:   Physical Exam BP 116/84 (BP Location: Left Arm, Patient Position: Sitting, Cuff Size: Small)   Pulse (!) 48   Temp 98.1 F (36.7 C) (Oral)   Resp 16   Ht 5\' 3"  (1.6 m)   Wt 155 lb 2 oz (70.4 kg)   SpO2 97%   BMI 27.48 kg/m  General:   Well developed, NAD, BMI noted. HEENT:  Normocephalic . Face symmetric, atraumatic Lungs:  CTA B Normal respiratory effort, no intercostal retractions, no accessory muscle use. Heart: RRR,  no murmur.  No pretibial edema bilaterally  Skin: Not pale. Not jaundice Neurologic:  alert & oriented X3.  Speech normal, gait appropriate for age and unassisted Psych--  Cognition and judgment appear intact.  Cooperative with normal attention span and concentration.  Behavior appropriate. No anxious or depressed  appearing.      Assessment     Assessment  Prediabetes: A1c 6.0 (03-2015) HTN (on BB) MSK: --DJD --Neck pain: Saw neurosurgery, MRI was done, DX DJD, nonsurgical candidate (2013 was ( --s/p L & R CTS surgery  Osteopenia first  T score -1.2 (06/2017) rx ca and vit D  Migraines  (onn BB and imitrex) Ear canal eczema: topical steroids prn  PLAN: HTN: On beta-blockers mostly for migraines, BP last week was 170 prior to CTS surgery,  BP today is very good.  I do not see any major problem, recommend to continue present care, check her BP every 2 weeks, call if problems. Status post right carpal tunnel syndrome surgery last week, doing well RTC schedule for 09/2018

## 2018-05-09 NOTE — Progress Notes (Signed)
Pre visit review using our clinic review tool, if applicable. No additional management support is needed unless otherwise documented below in the visit note. 

## 2018-05-10 ENCOUNTER — Ambulatory Visit: Payer: BC Managed Care – PPO | Admitting: Family Medicine

## 2018-05-10 DIAGNOSIS — I10 Essential (primary) hypertension: Secondary | ICD-10-CM | POA: Insufficient documentation

## 2018-05-10 NOTE — Assessment & Plan Note (Signed)
HTN: On beta-blockers mostly for migraines, BP last week was 170 prior to CTS surgery,  BP today is very good.  I do not see any major problem, recommend to continue present care, check her BP every 2 weeks, call if problems. Status post right carpal tunnel syndrome surgery last week, doing well RTC schedule for 09/2018

## 2018-06-14 DIAGNOSIS — G5603 Carpal tunnel syndrome, bilateral upper limbs: Secondary | ICD-10-CM | POA: Diagnosis not present

## 2018-06-14 DIAGNOSIS — M25541 Pain in joints of right hand: Secondary | ICD-10-CM | POA: Diagnosis not present

## 2018-06-14 DIAGNOSIS — M6281 Muscle weakness (generalized): Secondary | ICD-10-CM | POA: Diagnosis not present

## 2018-06-21 DIAGNOSIS — G5603 Carpal tunnel syndrome, bilateral upper limbs: Secondary | ICD-10-CM | POA: Diagnosis not present

## 2018-06-21 DIAGNOSIS — M25541 Pain in joints of right hand: Secondary | ICD-10-CM | POA: Diagnosis not present

## 2018-06-21 DIAGNOSIS — M6281 Muscle weakness (generalized): Secondary | ICD-10-CM | POA: Diagnosis not present

## 2018-06-23 DIAGNOSIS — M25541 Pain in joints of right hand: Secondary | ICD-10-CM | POA: Diagnosis not present

## 2018-06-23 DIAGNOSIS — G5603 Carpal tunnel syndrome, bilateral upper limbs: Secondary | ICD-10-CM | POA: Diagnosis not present

## 2018-06-23 DIAGNOSIS — M6281 Muscle weakness (generalized): Secondary | ICD-10-CM | POA: Diagnosis not present

## 2018-08-09 ENCOUNTER — Telehealth: Payer: Self-pay

## 2018-08-09 NOTE — Telephone Encounter (Signed)
Copied from [place] [id number]. Topic: General - Other >> Aug 09, 2018  8:36 AM Keene Breath wrote: Reason for CRM: Patient called to request that the nurse give her a call regarding a tick that she removed from her left shoulder blade.  Patient said there is very little scarring and no pain, but she wanted to know if she should take something for it.  Patient did not think that she needed a doctor appt.  CB# [phone]

## 2018-08-09 NOTE — Telephone Encounter (Signed)
Needs virtual visit 

## 2018-08-10 NOTE — Telephone Encounter (Signed)
Pt called back this am, insist she doesn't need virtual visit. Pt states her husband looked at the bite place this am, because it is on her shoulder blade, and it looks fine. You can see where he pulled it off, but other than that, he does not think there is any need for concern.  I  She was just wondering if she should take abx for precaution. I advised no abx w/out virtual visit.  Pt states she will call back if she has any further concerns.

## 2018-09-26 ENCOUNTER — Encounter: Payer: BC Managed Care – PPO | Admitting: Internal Medicine

## 2018-10-05 ENCOUNTER — Encounter: Payer: Self-pay | Admitting: Internal Medicine

## 2018-10-05 ENCOUNTER — Ambulatory Visit (INDEPENDENT_AMBULATORY_CARE_PROVIDER_SITE_OTHER): Payer: Medicare Other | Admitting: Internal Medicine

## 2018-10-05 ENCOUNTER — Other Ambulatory Visit: Payer: Self-pay

## 2018-10-05 VITALS — BP 173/50 | HR 53 | Temp 98.1°F | Resp 16 | Ht 63.0 in | Wt 159.2 lb

## 2018-10-05 DIAGNOSIS — Z0000 Encounter for general adult medical examination without abnormal findings: Secondary | ICD-10-CM

## 2018-10-05 DIAGNOSIS — R739 Hyperglycemia, unspecified: Secondary | ICD-10-CM | POA: Diagnosis not present

## 2018-10-05 DIAGNOSIS — I10 Essential (primary) hypertension: Secondary | ICD-10-CM | POA: Diagnosis not present

## 2018-10-05 LAB — CBC WITH DIFFERENTIAL/PLATELET
Basophils Absolute: 0 10*3/uL (ref 0.0–0.1)
Basophils Relative: 0.5 % (ref 0.0–3.0)
Eosinophils Absolute: 0.2 10*3/uL (ref 0.0–0.7)
Eosinophils Relative: 4.4 % (ref 0.0–5.0)
HCT: 38.9 % (ref 36.0–46.0)
Hemoglobin: 13.1 g/dL (ref 12.0–15.0)
Lymphocytes Relative: 38.1 % (ref 12.0–46.0)
Lymphs Abs: 1.7 10*3/uL (ref 0.7–4.0)
MCHC: 33.6 g/dL (ref 30.0–36.0)
MCV: 92.6 fl (ref 78.0–100.0)
Monocytes Absolute: 0.3 10*3/uL (ref 0.1–1.0)
Monocytes Relative: 6 % (ref 3.0–12.0)
Neutro Abs: 2.3 10*3/uL (ref 1.4–7.7)
Neutrophils Relative %: 51 % (ref 43.0–77.0)
Platelets: 208 10*3/uL (ref 150.0–400.0)
RBC: 4.2 Mil/uL (ref 3.87–5.11)
RDW: 12.4 % (ref 11.5–15.5)
WBC: 4.6 10*3/uL (ref 4.0–10.5)

## 2018-10-05 LAB — COMPREHENSIVE METABOLIC PANEL
ALT: 17 U/L (ref 0–35)
AST: 18 U/L (ref 0–37)
Albumin: 4.3 g/dL (ref 3.5–5.2)
Alkaline Phosphatase: 63 U/L (ref 39–117)
BUN: 22 mg/dL (ref 6–23)
CO2: 30 mEq/L (ref 19–32)
Calcium: 9.7 mg/dL (ref 8.4–10.5)
Chloride: 107 mEq/L (ref 96–112)
Creatinine, Ser: 0.78 mg/dL (ref 0.40–1.20)
GFR: 72.57 mL/min (ref >60.00)
Glucose, Bld: 107 mg/dL — ABNORMAL HIGH (ref 70–99)
Potassium: 5.3 mEq/L — ABNORMAL HIGH (ref 3.5–5.1)
Sodium: 141 mEq/L (ref 135–145)
Total Bilirubin: 0.5 mg/dL (ref 0.2–1.2)
Total Protein: 6.6 g/dL (ref 6.0–8.3)

## 2018-10-05 LAB — TSH: TSH: 0.98 u[IU]/mL (ref 0.35–4.50)

## 2018-10-05 LAB — LIPID PANEL
Cholesterol: 162 mg/dL (ref 0–200)
HDL: 50.4 mg/dL (ref >39.00)
LDL Cholesterol: 95 mg/dL (ref 0–99)
NonHDL: 111.97
Total CHOL/HDL Ratio: 3
Triglycerides: 84 mg/dL (ref 0.0–149.0)
VLDL: 16.8 mg/dL (ref 0.0–40.0)

## 2018-10-05 LAB — HEMOGLOBIN A1C: Hgb A1c MFr Bld: 6 % (ref 4.6–6.5)

## 2018-10-05 MED ORDER — AMLODIPINE BESYLATE 5 MG PO TABS: 5.0000 mg | ORAL_TABLET | Freq: Every day | ORAL | 6 refills | Status: DC

## 2018-10-05 MED ORDER — SHINGRIX 50 MCG/0.5ML IM SUSR: 0.5000 mL | Freq: Once | INTRAMUSCULAR | 1 refills | Status: AC

## 2018-10-05 NOTE — Assessment & Plan Note (Addendum)
--  Td 2011 -PNM 23: 2015 Prevnar-- 2016;  -zostavax 03-2015 -shingrix  rx printed  --Female care: @ [facility] Dr Philis Pique, next f/u 12/2019, does MMGs there  --XBM:[id number] 01-2015, + polyps, Dr Ardis Hughs, 5 years  --Labs : CMP, FLP, CBC, A1c, TSH --Diet and exercise discussed, doing well

## 2018-10-05 NOTE — Progress Notes (Signed)
Subjective:    Patient ID: Lori Goodwin, female    DOB: [birth year], 73 y.o.   MRN: [id number]  DOS:  10/05/2018 Type of visit - description: Here for CPX No major concerns She remains active, trying to eat healthy.   Review of Systems    A 14 point review of systems is negative    Past Medical History:  Diagnosis Date  . Allergic rhinitis   . Common migraine   . Hypertension     Past Surgical History:  Procedure Laterality Date  . CARPAL TUNNEL RELEASE Left 01/05/2016  . CARPAL TUNNEL RELEASE Right 04/2018  . TYMPANOSTOMY TUBE PLACEMENT      Social History   Socioeconomic History  . Marital status: Married    Spouse name: Not on file  . Number of children: 3  . Years of education: Not on file  . Highest education level: Not on file  Occupational History  . Occupation: [name], 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    Employer: [employer]  Social Needs  . Financial resource strain: Not on file  . Food insecurity    Worry: Not on file    Inability: Not on file  . Transportation needs    Medical: Not on file    Non-medical: Not on file  Tobacco Use  . Smoking status: Never Smoker  . Smokeless tobacco: Never Used  Substance and Sexual Activity  . Alcohol use: No  . Drug use: No  . Sexual activity: Not on file  Lifestyle  . Physical activity    Days per week: Not on file    Minutes per session: Not on file  . Stress: Not on file  Relationships  . Social Herbalist on phone: Not on file    Gets together: Not on file    Attends religious service: Not on file    Active member of club or organization: Not on file    Attends meetings of clubs or organizations: Not on file    Relationship status: Not on file  . Intimate partner violence    Fear of current or ex partner: Not on file    Emotionally abused: Not on file    Physically abused: Not on file    Forced sexual activity: Not on file  Other Topics Concern  . Not on file  Social History  Narrative   [place]   Divorced, remarried   Lives w/ husband   [name] her son lives in [place]     3 children ,1 son is paraplegic , has another that is a MD,other a PA       Family History  Problem Relation Age of Onset  . Colon cancer Neg Hx   . Breast cancer Neg Hx   . Diabetes Neg Hx   . Heart attack Neg Hx      Allergies as of 10/05/2018      Reactions   Tylenol [acetaminophen] Shortness Of Breath      Medication List       Accurate as of October 05, 2018 11:59 PM. If you have any questions, ask your nurse or doctor.        amLODipine 5 MG tablet Commonly known as: NORVASC Take 1 tablet (5 mg total) by mouth daily. Started by: Kathlene November, MD   multivitamin with minerals Tabs tablet Take 1 tablet by mouth daily.   propranolol ER 60 MG 24 hr capsule Commonly known as: INDERAL LA Take  1 capsule (60 mg total) by mouth daily.   Shingrix injection Generic drug: Zoster Vaccine Adjuvanted Inject 0.5 mLs into the muscle once for 1 dose. Started by: Kathlene November, MD   SUMAtriptan 50 MG tablet Commonly known as: IMITREX TAKE 1-2 TABLETS BY MOUTH AS NEEDED FOR MIGRAINE. MAY REPEAT IN 2 HOURS IF HEADACHE PERSISTS OR RECURS   Vitamin D 50 MCG (2000 UT) Caps Take 2 capsules by mouth.           Objective:   Physical Exam BP (!) 173/50 (BP Location: Left Arm, Patient Position: Sitting, Cuff Size: Small)   Pulse (!) 53   Temp 98.1 F (36.7 C) (Oral)   Resp 16   Ht 5\' 3"  (1.6 m)   Wt 159 lb 4 oz (72.2 kg)   SpO2 100%   BMI 28.21 kg/m  General: Well developed, NAD, BMI noted Neck: No  thyromegaly  HEENT:  Normocephalic . Face symmetric, atraumatic Lungs:  CTA B Normal respiratory effort, no intercostal retractions, no accessory muscle use. Heart: RRR,  no murmur.  No pretibial edema bilaterally  Abdomen:  Not distended, soft, non-tender. No rebound or rigidity.   Skin: Exposed areas without rash. Not pale. Not jaundice Neurologic:  alert &  oriented X3.  Speech normal, gait appropriate for age and unassisted Strength symmetric and appropriate for age.  Psych: Cognition and judgment appear intact.  Cooperative with normal attention span and concentration.  Behavior appropriate. No anxious or depressed appearing.     Assessment     Assessment  Prediabetes: A1c 6.0 (03-2015) HTN (on BB) MSK: --DJD --Neck pain: Saw neurosurgery, MRI was done, DX DJD, nonsurgical candidate (2013 was ( --s/p L & R CTS surgery  Osteopenia first  T score -1.2 (06/2017) rx ca and vit D  Migraines  (onn BB and imitrex) Ear canal eczema: topical steroids prn  PLAN: Here for a CPX Prediabetes: Check A1c, doing well with diet and exercise HTN: On Inderal, BP upon arrival elevated, I rechecked it: 160/80.  Heart rate is in the 50s. EKG sinus bradycardia Plan: Continue clear healthy lifestyle, add amlodipine 5 mg, monitor ambulatory BPs, office visit in 3 months Migraines: On beta-blockers, symptoms are actually getting less frequent. RTC 3 months

## 2018-10-05 NOTE — Patient Instructions (Addendum)
Get the blood work     [facility] Schedule your next appointment   for a checkup in 4 months  Start amlodipine 5 mg 1 tablet daily  Check the  blood pressure weekly be sure your blood pressure is between 110/65 and  135/85. If it is consistently higher or lower, let me know

## 2018-10-05 NOTE — Progress Notes (Signed)
Pre visit review using our clinic review tool, if applicable. No additional management support is needed unless otherwise documented below in the visit note. 

## 2018-10-07 NOTE — Assessment & Plan Note (Signed)
Prediabetes: Check A1c, doing well with diet and exercise HTN: On Inderal, BP upon arrival elevated, I rechecked it: 160/80.  Heart rate is in the 50s. EKG sinus bradycardia Plan: Continue clear healthy lifestyle, add amlodipine 5 mg, monitor ambulatory BPs, office visit in 3 months Migraines: On beta-blockers, symptoms are actually getting less frequent. RTC 3 months

## 2019-02-01 ENCOUNTER — Other Ambulatory Visit: Payer: Self-pay | Admitting: Internal Medicine

## 2019-02-01 MED ORDER — AMLODIPINE BESYLATE 5 MG PO TABS: 5.0000 mg | ORAL_TABLET | Freq: Every day | ORAL | 3 refills | Status: DC

## 2019-02-06 ENCOUNTER — Ambulatory Visit: Payer: Medicare Other | Admitting: Internal Medicine

## 2019-02-07 ENCOUNTER — Encounter: Payer: Self-pay | Admitting: Internal Medicine

## 2019-02-07 ENCOUNTER — Other Ambulatory Visit: Payer: Self-pay

## 2019-02-07 ENCOUNTER — Ambulatory Visit (INDEPENDENT_AMBULATORY_CARE_PROVIDER_SITE_OTHER): Payer: Medicare Other | Admitting: Internal Medicine

## 2019-02-07 VITALS — BP 135/75 | HR 62 | Temp 95.7°F | Resp 16 | Ht 63.0 in | Wt 160.4 lb

## 2019-02-07 DIAGNOSIS — L309 Dermatitis, unspecified: Secondary | ICD-10-CM

## 2019-02-07 DIAGNOSIS — I10 Essential (primary) hypertension: Secondary | ICD-10-CM | POA: Diagnosis not present

## 2019-02-07 DIAGNOSIS — Z23 Encounter for immunization: Secondary | ICD-10-CM | POA: Diagnosis not present

## 2019-02-07 LAB — BASIC METABOLIC PANEL
BUN: 18 mg/dL (ref 6–23)
CO2: 31 mEq/L (ref 19–32)
Calcium: 9.8 mg/dL (ref 8.4–10.5)
Chloride: 106 mEq/L (ref 96–112)
Creatinine, Ser: 0.71 mg/dL (ref 0.40–1.20)
GFR: 80.8 mL/min (ref >60.00)
Glucose, Bld: 113 mg/dL — ABNORMAL HIGH (ref 70–99)
Potassium: 4.7 mEq/L (ref 3.5–5.1)
Sodium: 141 mEq/L (ref 135–145)

## 2019-02-07 MED ORDER — CLOBETASOL PROPIONATE 0.05 % EX SOLN: Freq: Every day | CUTANEOUS | 1 refills | Status: DC | PRN

## 2019-02-07 NOTE — Patient Instructions (Addendum)
Please schedule Medicare Wellness with Glenard Haring.   GO TO THE LAB : Get the blood work     GO TO THE FRONT DESK Schedule your next appointment   for physical exam by 7-20 21   Continue checking your blood pressure  BP GOAL is between 110/65 and  135/85. If it is consistently higher or lower, let me know

## 2019-02-07 NOTE — Progress Notes (Signed)
Subjective:    Patient ID: Lori Goodwin, female    DOB: May 19, 1945, 73 y.o.   MRN: [id number]  DOS:  02/07/2019 Type of visit - description: Routine checkup HTN, we added amlodipine, ambulatory BPs in the 130s, 135. Labs reviewed, mild hypokalemia noted Had tick bite at the right upper back, the skin before the tick bite was normal.   Review of Systems Denies chest pain difficulty breathing No edema  Past Medical History:  Diagnosis Date  . Allergic rhinitis   . Common migraine   . Hypertension     Past Surgical History:  Procedure Laterality Date  . CARPAL TUNNEL RELEASE Left 01/05/2016  . CARPAL TUNNEL RELEASE Right 04/2018  . TYMPANOSTOMY TUBE PLACEMENT      Social History   Socioeconomic History  . Marital status: Married    Spouse name: Not on file  . Number of children: 3  . Years of education: Not on file  . Highest education level: Not on file  Occupational History  . Occupation: [name], 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    Employer: [employer]  Social Needs  . Financial resource strain: Not on file  . Food insecurity    Worry: Not on file    Inability: Not on file  . Transportation needs    Medical: Not on file    Non-medical: Not on file  Tobacco Use  . Smoking status: Never Smoker  . Smokeless tobacco: Never Used  Substance and Sexual Activity  . Alcohol use: No  . Drug use: No  . Sexual activity: Not on file  Lifestyle  . Physical activity    Days per week: Not on file    Minutes per session: Not on file  . Stress: Not on file  Relationships  . Social Herbalist on phone: Not on file    Gets together: Not on file    Attends religious service: Not on file    Active member of club or organization: Not on file    Attends meetings of clubs or organizations: Not on file    Relationship status: Not on file  . Intimate partner violence    Fear of current or ex partner: Not on file    Emotionally abused: Not on file    Physically  abused: Not on file    Forced sexual activity: Not on file  Other Topics Concern  . Not on file  Social History Narrative   [place]   Divorced, remarried   Lives w/ husband   [name] her son lives in [place]     3 children ,1 son is paraplegic , has another that is a MD,other a PA        Allergies as of 02/07/2019      Reactions   Tylenol [acetaminophen] Shortness Of Breath      Medication List       Accurate as of February 07, 2019 11:59 PM. If you have any questions, ask your nurse or doctor.        amLODipine 5 MG tablet Commonly known as: NORVASC Take 1 tablet (5 mg total) by mouth daily.   clobetasol 0.05 % external solution Commonly known as: TEMOVATE Apply topically daily as needed. Started by: Kathlene November, MD   multivitamin with minerals Tabs tablet Take 1 tablet by mouth daily.   propranolol ER 60 MG 24 hr capsule Commonly known as: INDERAL LA Take 1 capsule (60 mg total) by mouth daily.  SUMAtriptan 50 MG tablet Commonly known as: IMITREX TAKE 1-2 TABLETS BY MOUTH AS NEEDED FOR MIGRAINE. MAY REPEAT IN 2 HOURS IF HEADACHE PERSISTS OR RECURS   Vitamin D 50 MCG (2000 UT) Caps Take 2 capsules by mouth.           Objective:   Physical Exam Skin:        BP 135/75   Pulse 62   Temp (!) 95.7 F (35.4 C) (Temporal)   Resp 16   Ht 5\' 3"  (1.6 m)   Wt 160 lb 6 oz (72.7 kg)   SpO2 100%   BMI 28.41 kg/m   General:   Well developed, NAD, BMI noted. HEENT:  Normocephalic . Face symmetric, atraumatic Lungs:  CTA B Normal respiratory effort, no intercostal retractions, no accessory muscle use. Heart: RRR,  no murmur.  No pretibial edema bilaterally  Skin: Not pale. Not jaundice Neurologic:  alert & oriented X3.  Speech normal, gait appropriate for age and unassisted Psych--  Cognition and judgment appear intact.  Cooperative with normal attention span and concentration.  Behavior appropriate. No anxious or depressed appearing.       Assessment    Assessment  Prediabetes: A1c 6.0 (03-2015) HTN (on BB) MSK: --DJD --Neck pain: Saw neurosurgery, MRI was done, DX DJD, nonsurgical candidate (2013 was ( --s/p L & R CTS surgery  Osteopenia first  T score -1.2 (06/2017) rx ca and vit D  Migraines  (onn BB and imitrex) Ear canal eczema: topical steroids prn  PLAN: HTN: At the last visit, amlodipine was added to propranolol, ambulatory BPs normal, BP today elevated upon arrival, it was recheck and we obtained 130/75.  No change.  No apparent side effects. Tick bite: The patient had a tick bite at the right upper back, now she has what seems to be postinflammatory hyper pigmentation.  She is certain the skin was normal before tick bite consequently I do not think this is a new mole. Eczema, ear: RF topical steroids Preventive care flu shot today RTC CPX a month

## 2019-02-07 NOTE — Progress Notes (Signed)
Pre visit review using our clinic review tool, if applicable. No additional management support is needed unless otherwise documented below in the visit note. 

## 2019-02-08 NOTE — Assessment & Plan Note (Signed)
HTN: At the last visit, amlodipine was added to propranolol, ambulatory BPs normal, BP today elevated upon arrival, it was recheck and we obtained 130/75.  No change.  No apparent side effects. Tick bite: The patient had a tick bite at the right upper back, now she has what seems to be postinflammatory hyper pigmentation.  She is certain the skin was normal before tick bite consequently I do not think this is a new mole. Eczema, ear: RF topical steroids Preventive care flu shot today RTC CPX a month

## 2019-04-17 ENCOUNTER — Other Ambulatory Visit: Payer: Medicare Other

## 2019-05-02 DIAGNOSIS — Z01419 Encounter for gynecological examination (general) (routine) without abnormal findings: Secondary | ICD-10-CM | POA: Diagnosis not present

## 2019-05-02 DIAGNOSIS — Z1231 Encounter for screening mammogram for malignant neoplasm of breast: Secondary | ICD-10-CM | POA: Diagnosis not present

## 2019-05-02 LAB — HM MAMMOGRAPHY

## 2019-05-17 ENCOUNTER — Encounter: Payer: Self-pay | Admitting: Internal Medicine

## 2019-05-19 ENCOUNTER — Encounter: Payer: Self-pay | Admitting: Internal Medicine

## 2019-06-29 ENCOUNTER — Other Ambulatory Visit: Payer: Self-pay

## 2019-06-29 ENCOUNTER — Encounter: Payer: Self-pay | Admitting: Family Medicine

## 2019-06-29 ENCOUNTER — Ambulatory Visit (INDEPENDENT_AMBULATORY_CARE_PROVIDER_SITE_OTHER): Payer: Medicare Other | Admitting: Family Medicine

## 2019-06-29 ENCOUNTER — Ambulatory Visit: Payer: BC Managed Care – PPO

## 2019-06-29 VITALS — BP 110/80 | HR 54 | Ht 63.0 in | Wt 163.0 lb

## 2019-06-29 DIAGNOSIS — M76822 Posterior tibial tendinitis, left leg: Secondary | ICD-10-CM | POA: Diagnosis not present

## 2019-06-29 DIAGNOSIS — M79672 Pain in left foot: Secondary | ICD-10-CM | POA: Diagnosis not present

## 2019-06-29 NOTE — Progress Notes (Signed)
[place] Phone: [phone] Subjective:   [name], am serving as a scribe for Dr. Hulan Saas. This visit occurred during the SARS-CoV-2 public health emergency.  Safety protocols were in place, including screening questions prior to the visit, additional usage of staff PPE, and extensive cleaning of exam room while observing appropriate contact time as indicated for disinfecting solutions.   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  Discussed OTC orthotics, icing, HEP and what activities to avoid.  RTC in 4 weeks.   Update day 06/29/2019 Lori Goodwin is a 74 y.o. female coming in with complaint of leftfoot pain. Patient states that her left heel started itching 3 weeks ago and then she developed pain in the heel. Likes to walk and is able to walk but has soreness. Has tried ice and heat.  States that it seems to be more of the medial arch that patient is pointing towards.  Patient states that has been using the over-the-counter orthotics fairly regularly.    Past Medical History:  Diagnosis Date  . Allergic rhinitis   . Common migraine   . Hypertension    Past Surgical History:  Procedure Laterality Date  . CARPAL TUNNEL RELEASE Left 01/05/2016  . CARPAL TUNNEL RELEASE Right 04/2018  . TYMPANOSTOMY TUBE PLACEMENT     Social History   Socioeconomic History  . Marital status: Married    Spouse name: Not on file  . Number of children: 3  . Years of education: Not on file  . Highest education level: Not on file  Occupational History  . Occupation: [name], 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    Employer: [employer]  Tobacco Use  . Smoking status: Never Smoker  . Smokeless tobacco: Never Used  Substance and Sexual Activity  . Alcohol use: No  . Drug use: No  . Sexual activity: Not on file  Other  Topics Concern  . Not on file  Social History Narrative   [place] from [place]   Divorced, remarried   Lives w/ husband   [name] her son lives in [place]     3 children ,1 son is paraplegic , has another that is a MD,other a PA     Social Determinants of Radio broadcast assistant Strain:   . Difficulty of Paying Living Expenses:   Food Insecurity:   . Worried About Charity fundraiser in the Last Year:   . Arboriculturist in the Last Year:   Transportation Needs:   . Film/video editor (Medical):   [name] Lack of Transportation (Non-Medical):   Physical Activity:   . Days of Exercise per Week:   . Minutes of Exercise per Session:   Stress:   . Feeling of Stress :   Social Connections:   . Frequency of Communication with Friends and Family:   . Frequency of Social Gatherings with Friends and Family:   . Attends Religious Services:   . Active Member of Clubs or Organizations:   . Attends Archivist Meetings:   [name] Marital Status:    Allergies  Allergen Reactions  . Tylenol [Acetaminophen] Shortness Of Breath   Family History  Problem Relation Age of Onset  . Colon cancer Neg Hx   . Breast cancer Neg Hx   . Diabetes Neg Hx   .  Heart attack Neg Hx      Current Outpatient Medications (Cardiovascular):  .  amLODipine (NORVASC) 5 MG tablet, Take 1 tablet (5 mg total) by mouth daily. .  propranolol ER (INDERAL LA) 60 MG 24 hr capsule, Take 1 capsule (60 mg total) by mouth daily.   Current Outpatient Medications (Analgesics):  [name]  SUMAtriptan (IMITREX) 50 MG tablet, TAKE 1-2 TABLETS BY MOUTH AS NEEDED FOR MIGRAINE. MAY REPEAT IN 2 HOURS IF HEADACHE PERSISTS OR RECURS   Current Outpatient Medications (Other):  [name]  Cholecalciferol (VITAMIN D) 2000 units CAPS, Take 2 capsules by mouth. .  clobetasol (TEMOVATE) 0.05 % external solution, Apply topically daily as needed. .  Multiple Vitamin (MULTIVITAMIN WITH MINERALS) TABS, Take 1 tablet by mouth  daily.   Reviewed prior external information including notes and imaging from  primary care provider As well as notes that were available from care everywhere and other healthcare systems.  Past medical history, social, surgical and family history all reviewed in electronic medical record.  No pertanent information unless stated regarding to the chief complaint.   Review of Systems:  No headache, visual changes, nausea, vomiting, diarrhea, constipation, dizziness, abdominal pain, skin rash, fevers, chills, night sweats, weight loss, swollen lymph nodes, body aches, joint swelling, chest pain, shortness of breath, mood changes. POSITIVE muscle aches  Objective  Blood pressure 110/80, pulse (!) 54, height 5\' 3"  (1.6 m), weight 163 lb (73.9 kg), SpO2 99 %.   General: No apparent distress alert and oriented x3 mood and affect normal, dressed appropriately.  HEENT: Pupils equal, extraocular movements intact  Respiratory: Patient's speak in full sentences and does not appear short of breath  Cardiovascular: No lower extremity edema, non tender, no erythema  Neuro: Cranial nerves II through XII are intact, neurovascularly intact in all extremities with 2+ DTRs and 2+ pulses.  Gait normal with good balance and coordination.  MSK: Very mild arthritic changes. Foot exam shows a patient on the left foot does have tenderness over the medial arch proximally.  Patient has some minimal tightness of the posterior tibialis tendon.  Negative Tinel's sign.  No pain over the navicular prominence.  Patient does have some mild rigid midfoot  Limited musculoskeletal ultrasound was performed and interpreted by [name]  Limited ultrasound shows patient is posterior tibialis has some mild hypoechoic changes within the tendon sheath.  Insertion though does not appear to have any true tearing. Impression: Mild posterior tibialis tendinitis     [id number]; 15 additional minutes spent for Therapeutic exercises as  stated in above notes.  This included exercises focusing on stretching, strengthening, with significant focus on eccentric aspects.   Long term goals include an improvement in range of motion, strength, endurance as well as avoiding reinjury. Patient's frequency would include in 1-2 times a day, 3-5 times a week for a duration of 6-12 weeks.  Ankle strengthening that included:  Basic range of motion exercises to allow proper full motion at ankle Stretching of the lower leg and hamstrings  Theraband exercises for the lower leg - inversion, eversion, dorsiflexion and plantarflexion each to be completed with a theraband Balance exercises to increase proprioception Weight bearing exercises to increase strength and balance Proper technique shown and discussed handout in great detail with ATC.  All questions were discussed and answered.   Impression and Recommendations:     This case required medical decision making of moderate complexity. The above documentation has been reviewed and is accurate and complete Olevia Bowens  Tamala Julian, DO       Note: This dictation was prepared with Dragon dictation along with smaller phrase technology. Any transcriptional errors that result from this process are unintentional.

## 2019-06-29 NOTE — Patient Instructions (Signed)
Vitamin D 4,000 daily Take 500 vitamin C with iron containing meals oofos and hoka recovery sandals Exercise 3 times a week See me again in 4-6 weeks

## 2019-06-29 NOTE — Assessment & Plan Note (Signed)
It appears the patient does have some mild swelling noted of the posterior tibialis tendon.  We discussed with patient about home exercises and patient work with Product/process development scientist.  We discussed icing regimen, recovery sandals, continuing the over-the-counter orthotics.  Worsening pain will consider formal physical therapy, injections, or even potentially custom orthotics.  Patient should do relatively well though with conservative therapy.

## 2019-09-14 ENCOUNTER — Encounter: Payer: Self-pay | Admitting: Family Medicine

## 2019-09-14 ENCOUNTER — Ambulatory Visit (INDEPENDENT_AMBULATORY_CARE_PROVIDER_SITE_OTHER): Payer: Medicare Other

## 2019-09-14 ENCOUNTER — Ambulatory Visit (INDEPENDENT_AMBULATORY_CARE_PROVIDER_SITE_OTHER): Payer: Medicare Other | Admitting: Family Medicine

## 2019-09-14 ENCOUNTER — Other Ambulatory Visit: Payer: Self-pay

## 2019-09-14 VITALS — BP 104/80 | HR 53 | Ht 63.0 in | Wt 162.0 lb

## 2019-09-14 DIAGNOSIS — M5416 Radiculopathy, lumbar region: Secondary | ICD-10-CM | POA: Insufficient documentation

## 2019-09-14 DIAGNOSIS — M545 Low back pain, unspecified: Secondary | ICD-10-CM

## 2019-09-14 DIAGNOSIS — M79605 Pain in left leg: Secondary | ICD-10-CM

## 2019-09-14 DIAGNOSIS — G8929 Other chronic pain: Secondary | ICD-10-CM

## 2019-09-14 DIAGNOSIS — M47816 Spondylosis without myelopathy or radiculopathy, lumbar region: Secondary | ICD-10-CM | POA: Diagnosis not present

## 2019-09-14 MED ORDER — GABAPENTIN 100 MG PO CAPS: 200.0000 mg | ORAL_CAPSULE | Freq: Every day | ORAL | 3 refills | Status: DC

## 2019-09-14 NOTE — Assessment & Plan Note (Signed)
Patient is some signs and symptoms consistent with a lumbar radiculopathy but differential also includes more of a greater trochanteric bursitis.  Discussed the potential for an injection that would be diagnostic and therapeutic which patient declined but was very adamant she would like to try physical therapy.  Patient will be referred today.  Started on low-dose gabapentin.  X-rays are increasing at the moment.  Follow-up again in 4 to 8 weeks

## 2019-09-14 NOTE — Patient Instructions (Addendum)
Good to see you [place] they will call you Pennsaid small amount at least 2 times a day  Gabapentin 100 mg at night Xray back and pelvis  See me again in 6 weeks

## 2019-09-14 NOTE — Progress Notes (Signed)
Lori Goodwin [address] [name] Phone: [phone] Subjective:   I Lori Goodwin am serving as a Education administrator for Dr. Hulan Saas.  This visit occurred during the SARS-CoV-2 public health emergency.  Safety protocols were in place, including screening questions prior to the visit, additional usage of staff PPE, and extensive cleaning of exam room while observing appropriate contact time as indicated for disinfecting solutions.   I'm seeing this patient by the request  of:  Colon Branch, MD  CC: Left hip pain  [id number]   06/19/2019 It appears the patient does have some mild swelling noted of the posterior tibialis tendon.  We discussed with patient about home exercises and patient work with Product/process development scientist.  We discussed icing regimen, recovery sandals, continuing the over-the-counter orthotics.  Worsening pain will consider formal physical therapy, injections, or even potentially custom orthotics.  Patient should do relatively well though with conservative therapy.  Update 09/14/2019 Lori Goodwin is a 74 y.o. female coming in with complaint of left leg pain. Patient last seen for posterior tib tendonitis left foot. Patient states she is having muscle pain in the left leg with sitting to standing movement. Can't sleep on the left side due to pain. Tibialis anterior and lateral left hip pain. Pain effects the way she walks.  States that it does wake her up at night denies weakness     Past Medical History:  Diagnosis Date  . Allergic rhinitis   . Common migraine   . Hypertension    Past Surgical History:  Procedure Laterality Date  . CARPAL TUNNEL RELEASE Left 01/05/2016  . CARPAL TUNNEL RELEASE Right 04/2018  . TYMPANOSTOMY TUBE PLACEMENT     Social History   Socioeconomic History  . Marital status: Married    Spouse name: Not on file  . Number of children: 3  . Years of education: Not on file  . Highest education  level: Not on file  Occupational History  . 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 Smoking status: Never Smoker  . Smokeless tobacco: Never Used  Substance and Sexual Activity  . Alcohol use: No  . Drug use: No  . Sexual activity: Not on file  Other Topics Concern  . Not on file  Social History Narrative   [place] from [place]   Divorced, remarried   Lives w/ husband   Lori Goodwin her son lives in [place]     3 children ,1 son is paraplegic , has another that is a MD,other a PA     Social Determinants of Radio broadcast assistant Strain:   . Difficulty of Paying Living Expenses:   Food Insecurity:   . Worried About Charity fundraiser in the Last Year:   . Arboriculturist in the Last Year:   Transportation Needs:   . Film/video editor (Medical):   [name] Lack of Transportation (Non-Medical):   Physical Activity:   . Days of Exercise per Week:   . Minutes of Exercise per Session:   Stress:   . Feeling of Stress :   Social Connections:   . Frequency of Communication with Friends and Family:   . Frequency of Social Gatherings with Friends and Family:   . Attends Religious Services:   . Active Member of Clubs or Organizations:   . Attends Archivist Meetings:   [name] Marital Status:  Allergies  Allergen Reactions  . Tylenol [Acetaminophen] Shortness Of Breath   Family History  Problem Relation Age of Onset  . Colon cancer Neg Hx   . Breast cancer Neg Hx   . Diabetes Neg Hx   . Heart attack Neg Hx      Current Outpatient Medications (Cardiovascular):  .  amLODipine (NORVASC) 5 MG tablet, Take 1 tablet (5 mg total) by mouth daily. .  propranolol ER (INDERAL LA) 60 MG 24 hr capsule, Take 1 capsule (60 mg total) by mouth daily.   Current Outpatient Medications (Analgesics):  [name]  SUMAtriptan (IMITREX) 50 MG tablet, TAKE 1-2 TABLETS BY MOUTH AS NEEDED FOR MIGRAINE. MAY REPEAT IN 2 HOURS IF HEADACHE PERSISTS OR  RECURS   Current Outpatient Medications (Other):  [name]  Cholecalciferol (VITAMIN D) 2000 units CAPS, Take 2 capsules by mouth. .  clobetasol (TEMOVATE) 0.05 % external solution, Apply topically daily as needed. .  Multiple Vitamin (MULTIVITAMIN WITH MINERALS) TABS, Take 1 tablet by mouth daily. [name]  gabapentin (NEURONTIN) 100 MG capsule, Take 2 capsules (200 mg total) by mouth at bedtime.   Reviewed prior external information including notes and imaging from  primary care provider As well as notes that were available from care everywhere and other healthcare systems.  Past medical history, social, surgical and family history all reviewed in electronic medical record.  No pertanent information unless stated regarding to the chief complaint.   Review of Systems:  No headache, visual changes, nausea, vomiting, diarrhea, constipation, dizziness, abdominal pain, skin rash, fevers, chills, night sweats, weight loss, swollen lymph nodes, body aches, joint swelling, chest pain, shortness of breath, mood changes. POSITIVE muscle aches  Objective  Blood pressure 104/80, pulse (!) 53, height 5\' 3"  (1.6 m), weight 162 lb (73.5 kg), SpO2 97 %.   General: No apparent distress alert and oriented x3 mood and affect normal, dressed appropriately.  HEENT: Pupils equal, extraocular movements intact  Respiratory: Patient's speak in full sentences and does not appear short of breath  Cardiovascular: No lower extremity edema, non tender, no erythema  Neuro: Cranial nerves II through XII are intact, neurovascularly intact in all extremities with 2+ DTRs and 2+ pulses.  Gait antalgic MSK: Left hip exam shows the patient is tender to palpation over the greater trochanteric area.  Mild positive FABER test.  Mild pain over the sacroiliac joint.  Negative straight leg test but significant tightness of the hip girdles bilaterally.    Impression and Recommendations:     The above documentation has been reviewed and  is accurate and complete [name], DO       Note: This dictation was prepared with Dragon dictation along with smaller phrase technology. Any transcriptional errors that result from this process are unintentional.

## 2019-09-27 ENCOUNTER — Encounter: Payer: Self-pay | Admitting: Physical Therapy

## 2019-09-27 ENCOUNTER — Ambulatory Visit: Payer: Medicare Other | Attending: Family Medicine | Admitting: Physical Therapy

## 2019-09-27 ENCOUNTER — Other Ambulatory Visit: Payer: Self-pay

## 2019-09-27 DIAGNOSIS — M62838 Other muscle spasm: Secondary | ICD-10-CM

## 2019-09-27 DIAGNOSIS — M25552 Pain in left hip: Secondary | ICD-10-CM

## 2019-09-27 DIAGNOSIS — M6281 Muscle weakness (generalized): Secondary | ICD-10-CM | POA: Diagnosis not present

## 2019-09-27 DIAGNOSIS — M5442 Lumbago with sciatica, left side: Secondary | ICD-10-CM | POA: Diagnosis not present

## 2019-09-27 DIAGNOSIS — G8929 Other chronic pain: Secondary | ICD-10-CM

## 2019-09-27 NOTE — Therapy (Signed)
[facility] [address]  [place], [place], [address] Phone: [phone]   Fax:  [phone]  Physical Therapy Evaluation  Patient Details  Name: Lori Goodwin MRN: [id number] Date of Birth: [birth year] Referring Provider (PT): Dr Charlann Boxer   Encounter Date: 09/27/2019   PT End of Session - 09/27/19 1006    Visit Number 1    Number of Visits 6    Date for PT Re-Evaluation 11/08/19    Authorization Type BCBS with MCR    PT Start Time 1007    PT Stop Time 1057    PT Time Calculation (min) 50 min    Activity Tolerance Patient tolerated treatment well    Behavior During Therapy [facility] for tasks assessed/performed           Past Medical History:  Diagnosis Date  . Allergic rhinitis   . Common migraine   . Hypertension     Past Surgical History:  Procedure Laterality Date  . CARPAL TUNNEL RELEASE Left 01/05/2016  . CARPAL TUNNEL RELEASE Right 04/2018  . TYMPANOSTOMY TUBE PLACEMENT      There were no vitals filed for this visit.    Subjective Assessment - 09/27/19 1007    Subjective Pt reports she doesn't really have back pain, it is mainly in her Lt leg, not able to point to one place.  The pain is mainly after prolonged sitting and transitioning to stand.  Once walking the pain settles down.  Her pain will go down into the lower leg.    Diagnostic tests xrays DDD of spine,    Patient Stated Goals Get rid of pain and not limp when she first stands up and lie on Lt side.    Currently in Pain? No/denies   no pain with sitting.  Pain up to 7/10 with standing, settles down after walking 2-3 min. No pain with bed mobility             [facility] PT Assessment - 09/27/19 0001      Assessment   Medical Diagnosis LBP with Lt LE radiculopathy, Lt hip bursitits    Referring Provider (PT) Dr Charlann Boxer    Onset Date/Surgical Date 06/27/19    Next MD Visit 10/19/2019    Prior Therapy not for this, only for hands  after carpal tunnel release 04/2018      Precautions   Precautions None      Balance Screen   Has the patient fallen in the past 6 months No    Has the patient had a decrease in activity level because of a fear of falling?  No    Is the patient reluctant to leave their home because of a fear of falling?  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    Leisure walking, gardening      Observation/Other Assessments   Other Surveys  Lower Extremity Functional Scale    Lower Extremity Functional Scale  67/80, 83.8% ability      Observation/Other Assessments-Edema    Edema --   pt reports some edema around the Lt anterior hip     Sensation   Light Touch Appears Intact    Hot/Cold Appears  Intact      Functional Tests   Functional tests Squat;Single leg stance      Squat   Comments WNL      Single Leg Stance   Comments Rt 10 sec, Lt 10 sec with pain in Lt LE to calf      Posture/Postural Control   Posture/Postural Control Postural limitations    Postural Limitations Rounded Shoulders;Increased lumbar lordosis   upper body shift to Rt      ROM / Strength   AROM / PROM / Strength AROM;Strength      AROM   AROM Assessment Site Hip;Lumbar    Lumbar Flexion to lower shin - pulling in back of Lt LE     Lumbar Extension 50% present with pressure in midline low back     Lumbar - Right Rotation WNL    Lumbar - Left Rotation WNL      Strength   Strength Assessment Site Hip;Knee;Ankle    Right/Left Hip Left   Rt 5/5 except extension 4/5, some back pain   Left Hip Flexion 4/5   pain in front of Lt hip    Left Hip Extension 4+/5    Left Hip ABduction 3-/5    Right/Left Knee --   5/5   Right/Left Ankle --   5/5     Flexibility   Soft Tissue Assessment /Muscle Length yes     Hamstrings supine SLR Lt 80, Rt 90    Quadriceps prone knee flex bilat 120 degrees, ~ 6" from buttocks     ITB tight bilat Lt > Rt     Piriformis tight Rt       Palpation   Spinal mobility hypomobile and tender L3-T 4 with CPA and bilat UPA mobs     Palpation comment tender in Lt TFL and around posterio greater troch. tight and some tendeness in Lt gluts and piriformis      Special Tests   Other special tests (-) SLR and slump bilat                       Objective measurements completed on examination: See above findings.       [facility] PT Treatment/Exercise - 09/27/19 0001      Exercises   Exercises Knee/Hip      Knee/Hip Exercises: Stretches   ITB Stretch Both;30 seconds    ITB Stretch Limitations cross body with strap    Piriformis Stretch Both;30 seconds    Piriformis Stretch Limitations supine figure 4    Other Knee/Hip Stretches SKTC with leg press opposite side and thoracic mobilztion lying over yoga mat                  PT Education - 09/27/19 1153    Education Details HEP, POC    Person(s) Educated Patient    Methods Explanation;Demonstration;Handout    Comprehension Returned demonstration;Verbalized understanding               PT Long Term Goals - 09/27/19 1214      PT LONG TERM GOAL #1   Title I with advanced HEP    Time 6    Period Weeks    Status New    Target Date 11/08/19      PT LONG TERM GOAL #2   Title report no more than 1/10 pain in the Lt LE with transitioning sit to stand    Time 6    Period Weeks  Status New    Target Date 10/25/19      PT LONG TERM GOAL #3   Title increase Lt hip strength =/> 5-/5 to allow return to normal activitiy    Time 6    Period Weeks    Status New    Target Date 11/08/19      PT LONG TERM GOAL #4   Title report ability to sleep per her baseline on the Lt side    Time 6    Period Weeks    Status New    Target Date 11/08/19      PT LONG TERM GOAL #5   Title report =/>  75% improvement in her ability to donn shoes and socks    Time 6    Period Weeks    Status New    Target Date 11/08/19                  Plan - 09/27/19 1154    Clinical Impression Statement 74 yo female with reports of Lt leg pain for the last 3 months.  She did have plantarfascitis before this and it has resovled.  MD reports lumbar radiculopathy vs hip bursitis.  She is point tender around the Lt hip greater trochanter, has Lt hip weakness and tightness in the TFL, ITB and gluts.  She is also tight in the Rt piriformis and is hypomoblie in the upper lumbar and thoracic spine.  She is hesitant to have iontophoresis and DN as she requests to have conservative treatment first.  She also requests once a week to control the cost of therapy.  She would benefit from PT to retore hip strenght and flexibilty, reduce pain and restore her PLOF    Comorbidities bilat carpal tunnel release, HTN, lumbar DJD, allergies    Examination-Activity Limitations Stand;Sleep    Examination-Participation Restrictions Other    Stability/Clinical Decision Making Stable/Uncomplicated    Clinical Decision Making Low    Rehab Potential Excellent    PT Frequency 1x / week    PT Duration 6 weeks    PT Treatment/Interventions Iontophoresis 4mg /ml Dexamethasone;Taping;Patient/family education;Moist Heat;Functional mobility training;Therapeutic activities;Ultrasound;Cryotherapy;Electrical Stimulation;Balance training;Manual techniques;Dry needling;Therapeutic exercise    PT Next Visit Plan assess response to HEP, manual work to [employer] gluts/ITB/TFL begin Insurance claims handler with Plan of Care Patient           Patient will benefit from skilled therapeutic intervention in order to improve the following deficits and impairments:  Increased muscle spasms, Pain, Hypomobility, Impaired flexibility, Decreased strength  Visit Diagnosis: Chronic bilateral low back pain with left-sided sciatica - Plan: PT  plan of care cert/re-cert  Pain in left hip - Plan: PT plan of care cert/re-cert  Muscle weakness (generalized) - Plan: PT plan of care cert/re-cert  Other muscle spasm - Plan: PT plan of care cert/re-cert     Problem List Patient Active Problem List   Diagnosis Date Noted  . Acute left lumbar radiculopathy 09/14/2019  . Left tibialis posterior tendinitis 06/29/2019  . Hyperglycemia 10/05/2018  . Hypertension 05/10/2018  . Abnormality of gait 06/30/2016  . Arthritis of midfoot 06/02/2016  . PCP NOTES >>>> 03/18/2015  . CTS (carpal tunnel syndrome) 08/01/2014  . Eczema, ear canal 05/15/2014  . Eustachian tube dysfunction 04/08/2014  . Annual physical exam 02/19/2014  . DJD (degenerative joint disease) 01/15/2012  . Fatigue 10/31/2010  . Menopause 10/31/2010  . ALLERGIC RHINITIS 01/07/2007  . COMMON MIGRAINE 12/15/2006    Manuela Schwartz  Ari Bernabei PT  09/27/2019, 12:19 PM  [facility] [address]  [place], [place], [address] Phone: [phone]   Fax:  [phone]  Name: Lori Goodwin MRN: [id number] Date of Birth: 10-Jan-1946

## 2019-09-27 NOTE — Patient Instructions (Signed)
IONTOPHORESIS PATIENT PRECAUTIONS & CONTRAINDICATIONS:  . Redness under one or both electrodes can occur.  This characterized by a uniform redness that usually disappears within 12 hours of treatment. . Small pinhead size blisters may result in response to the drug.  Contact your physician if the problem persists more than 24 hours. . On rare occasions, iontophoresis therapy can result in temporary skin reactions such as rash, inflammation, irritation or burns.  The skin reactions may be the result of individual sensitivity to the ionic solution used, the condition of the skin at the start of treatment, reaction to the materials in the electrodes, allergies or sensitivity to dexamethasone, or a poor connection between the patch and your skin.  Discontinue using iontophoresis if you have any of these reactions and report to your therapist. . Remove the Patch or electrodes if you have any undue sensation of pain or burning during the treatment and report discomfort to your therapist. . Tell your Therapist if you have had known adverse reactions to the application of electrical current. . If using the Patch, the LED light will turn off when treatment is complete and the patch can be removed.  Approximate treatment time is 1-3 hours.  Remove the patch when light goes off or after 6 hours. . The Patch can be worn during normal activity, however excessive motion where the electrodes have been placed can cause poor contact between the skin and the electrode or uneven electrical current resulting in greater risk of skin irritation. . Keep out of the reach of children.   . DO NOT use if you have a cardiac pacemaker or any other electrically sensitive implanted device. . DO NOT use if you have a known sensitivity to dexamethasone. . DO NOT use during Magnetic Resonance Imaging (MRI). . DO NOT use over broken or compromised skin (e.g. sunburn, cuts, or acne) due to the increased risk of skin reaction. . DO  NOT SHAVE over the area to be treated:  To establish good contact between the Patch and the skin, excessive hair may be clipped. . DO NOT place the Patch or electrodes on or over your eyes, directly over your heart, or brain. . DO NOT reuse the Patch or electrodes as this may cause burns to occur.  Trigger Point Dry Needling  . What is Trigger Point Dry Needling (DN)? o DN is a physical therapy technique used to treat muscle pain and dysfunction. Specifically, DN helps deactivate muscle trigger points (muscle knots).  o A thin filiform needle is used to penetrate the skin and stimulate the underlying trigger point. The goal is for a local twitch response (LTR) to occur and for the trigger point to relax. No medication of any kind is injected during the procedure.   . What Does Trigger Point Dry Needling Feel Like?  o The procedure feels different for each individual patient. Some patients report that they do not actually feel the needle enter the skin and overall the process is not painful. Very mild bleeding may occur. However, many patients feel a deep cramping in the muscle in which the needle was inserted. This is the local twitch response.   . How Will I feel after the treatment? o Soreness is normal, and the onset of soreness may not occur for a few hours. Typically this soreness does not last longer than two days.  o Bruising is uncommon, however; ice can be used to decrease any possible bruising.  o In rare cases feeling tired or   nauseous after the treatment is normal. In addition, your symptoms may get worse before they get better, this period will typically not last longer than 24 hours.   . What Can I do After My Treatment? o Increase your hydration by drinking more water for the next 24 hours. o You may place ice or heat on the areas treated that have become sore, however, do not use heat on inflamed or bruised areas. Heat often brings more relief post needling. o You can continue  your regular activities, but vigorous activity is not recommended initially after the treatment for 24 hours. o DN is best combined with other physical therapy such as strengthening, stretching, and other therapies.   

## 2019-10-06 ENCOUNTER — Ambulatory Visit: Payer: Medicare Other | Attending: Family Medicine | Admitting: Physical Therapy

## 2019-10-06 ENCOUNTER — Encounter: Payer: Self-pay | Admitting: Physical Therapy

## 2019-10-06 ENCOUNTER — Other Ambulatory Visit: Payer: Self-pay

## 2019-10-06 DIAGNOSIS — G8929 Other chronic pain: Secondary | ICD-10-CM | POA: Diagnosis not present

## 2019-10-06 DIAGNOSIS — M62838 Other muscle spasm: Secondary | ICD-10-CM | POA: Diagnosis not present

## 2019-10-06 DIAGNOSIS — M25552 Pain in left hip: Secondary | ICD-10-CM | POA: Diagnosis not present

## 2019-10-06 DIAGNOSIS — M6281 Muscle weakness (generalized): Secondary | ICD-10-CM | POA: Diagnosis not present

## 2019-10-06 DIAGNOSIS — M5442 Lumbago with sciatica, left side: Secondary | ICD-10-CM | POA: Insufficient documentation

## 2019-10-06 NOTE — Therapy (Signed)
[facility] [place], [place], [address] Phone: [phone]   Fax:  [phone]  Physical Therapy Treatment  Patient Details  Name: Lori Goodwin MRN: [id number] Date of Birth: 11/18/1945 Referring Provider (PT): Dr Charlann Boxer   Encounter Date: 10/06/2019   PT End of Session - 10/06/19 0808    Visit Number 2    Number of Visits 6    Date for PT Re-Evaluation 11/08/19    Authorization Type BCBS with [facility]    PT Start Time 0808   Pt arrived late   PT Stop Time 0850    PT Time Calculation (min) 42 min    Activity Tolerance Patient tolerated treatment well    Behavior During Therapy [facility] for tasks assessed/performed           Past Medical History:  Diagnosis Date   Allergic rhinitis    Common migraine    Hypertension     Past Surgical History:  Procedure Laterality Date   CARPAL TUNNEL RELEASE Left 01/05/2016   CARPAL TUNNEL RELEASE Right 04/2018   TYMPANOSTOMY TUBE PLACEMENT      There were no vitals filed for this visit.   Subjective Assessment - 10/06/19 0813    Subjective Pt noting a little relief from the stretches prescribed on the eval.    Diagnostic tests xrays DDD of spine,    Patient Stated Goals Get rid of pain and not limp when she first stands up and lie on Lt side.    Currently in Pain? No/denies                             [facility] Adult PT Treatment/Exercise - 10/06/19 0808      Exercises   Exercises Knee/Hip      Lumbar Exercises: Supine   Pelvic Tilt 10 reps;5 seconds      Knee/Hip Exercises: Aerobic   Recumbent Bike L1 x 5 min   pillow behind back - may do better with NuStep next visit     Knee/Hip Exercises: Supine   Bridges Both;10 reps;Strengthening    Bridges Limitations cues for abd bracing & glute set prior to lift      Knee/Hip Exercises: Sidelying   Hip ABduction Left;10 reps;AROM;Strengthening    Hip ABduction Limitations +  slight hip extension    Clams L 10 x 3"    Other Sidelying Knee/Hip Exercises Pilates L hip flexion/extension & front/back taps x 10 each      Manual Therapy   Manual Therapy Soft tissue mobilization;Myofascial release;Other (comment)    Manual therapy comments R s/l    Soft tissue mobilization STM/DTM to L TFL, glutes and ITB; roller stick to L ITB    Myofascial Release manual TPR to L lateral glutes; pin & stretch to L ITB    Other Manual Therapy Provided instruction in self-STM to glutes & TFL using ball on wall, rolling pin for ITB                  PT Education - 10/06/19 0845    Education Details HEP update - hip strengthening, self-STM/rolling    Person(s) Educated Patient    Methods Explanation;Demonstration;Verbal cues;Tactile cues;Handout    Comprehension Verbalized understanding;Returned demonstration;Verbal cues required;Tactile cues required;Need further instruction               PT Long Term Goals - 10/06/19 2229  PT LONG TERM GOAL #1   Title I with advanced HEP    Time 6    Period Weeks    Status On-going    Target Date 11/08/19      PT LONG TERM GOAL #2   Title report no more than 1/10 pain in the Lt LE with transitioning sit to stand    Time 6    Period Weeks    Status On-going    Target Date 11/08/19      PT LONG TERM GOAL #3   Title increase Lt hip strength =/> 5-/5 to allow return to normal activitiy    Time 6    Period Weeks    Status On-going    Target Date 11/08/19      PT LONG TERM GOAL #4   Title report ability to sleep per her baseline on the Lt side    Time 6    Period Weeks    Status On-going    Target Date 11/08/19      PT LONG TERM GOAL #5   Title report =/> 75% improvement in her ability to donn shoes and socks    Time 6    Period Weeks    Status On-going    Target Date 11/08/19                 Plan - 10/06/19 0816    Clinical Impression Statement Lori Goodwin noting some benefit from initial HEP  stretches and able to provide good return demonstration. Continued ttp and increased muscle tension present in L TFL, glutes and ITB which was addressed with manual STM/DTM and MFR (pt wishing to defer DN for now to see how she progresses) with pt instructed in self-STM techniques using tennis ball on wall and rolling pin. Introduced hip and core/lumbopelvic strengthening focusing on L side during therapy session but pt encouraged to perform bilaterally at home.    Comorbidities bilat carpal tunnel release, HTN, lumbar DJD, allergies    Examination-Activity Limitations Stand;Sleep    Examination-Participation Restrictions Other    Stability/Clinical Decision Making Stable/Uncomplicated    Rehab Potential Excellent    PT Frequency 1x / week    PT Duration 6 weeks    PT Treatment/Interventions Iontophoresis 4mg /ml Dexamethasone;Taping;Patient/family education;Moist Heat;Functional mobility training;Therapeutic activities;Ultrasound;Cryotherapy;Electrical Stimulation;Balance training;Manual techniques;Dry needling;Therapeutic exercise    PT Next Visit Plan manual work to Lt gluts/ITB/TFL, begin Radiation protection practitioner and Agree with Plan of Care Patient           Patient will benefit from skilled therapeutic intervention in order to improve the following deficits and impairments:  Increased muscle spasms, Pain, Hypomobility, Impaired flexibility, Decreased strength  Visit Diagnosis: Chronic bilateral low back pain with left-sided sciatica  Pain in left hip  Muscle weakness (generalized)  Other muscle spasm     Problem List Patient Active Problem List   Diagnosis Date Noted   Acute left lumbar radiculopathy 09/14/2019   Left tibialis posterior tendinitis 06/29/2019   Hyperglycemia 10/05/2018   Hypertension 05/10/2018   Abnormality of gait 06/30/2016   Arthritis of midfoot 06/02/2016   PCP NOTES >>>> 03/18/2015   CTS (carpal tunnel syndrome) 08/01/2014   Eczema, ear  canal 05/15/2014   Eustachian tube dysfunction 04/08/2014   Annual physical exam 02/19/2014   DJD (degenerative joint disease) 01/15/2012   Fatigue 10/31/2010   Menopause 10/31/2010   ALLERGIC RHINITIS 01/07/2007   COMMON MIGRAINE 12/15/2006    Percival Spanish, PT, MPT 10/06/2019, 10:10 AM  [facility] [address]  [place], [place], [address] Phone: [phone]   Fax:  [phone]  Name: Lori Goodwin MRN: [id number] Date of Birth: Aug 24, 1945

## 2019-10-06 NOTE — Patient Instructions (Signed)
    Home exercise program created by Kayti Poss, PT.  For questions, please contact Margot Oriordan via phone at [phone] or email at [email or web address]  [facility] [place] [address]  [address] [place], [place], [address] Phone: [phone]   Fax:  [phone]    

## 2019-10-11 ENCOUNTER — Encounter: Payer: Medicare Other | Admitting: Internal Medicine

## 2019-10-20 ENCOUNTER — Ambulatory Visit: Payer: Medicare Other

## 2019-10-27 ENCOUNTER — Ambulatory Visit: Payer: Medicare Other | Admitting: Family Medicine

## 2019-11-02 ENCOUNTER — Ambulatory Visit: Payer: Medicare Other

## 2019-11-02 ENCOUNTER — Other Ambulatory Visit: Payer: Self-pay

## 2019-11-02 DIAGNOSIS — M62838 Other muscle spasm: Secondary | ICD-10-CM | POA: Diagnosis not present

## 2019-11-02 DIAGNOSIS — M5442 Lumbago with sciatica, left side: Secondary | ICD-10-CM | POA: Diagnosis not present

## 2019-11-02 DIAGNOSIS — M25552 Pain in left hip: Secondary | ICD-10-CM | POA: Diagnosis not present

## 2019-11-02 DIAGNOSIS — G8929 Other chronic pain: Secondary | ICD-10-CM | POA: Diagnosis not present

## 2019-11-02 DIAGNOSIS — M6281 Muscle weakness (generalized): Secondary | ICD-10-CM

## 2019-11-02 NOTE — Patient Instructions (Addendum)

## 2019-11-02 NOTE — Therapy (Addendum)
[place] [address]  [place], [place], [address] Phone: [phone]   Fax:  [phone]  Physical Therapy Treatment / Discharge Summary  Patient Details  Name: ARRYN Goodwin MRN: [id number] Date of Birth: 1945/05/10 Referring Provider (PT): Dr Charlann Boxer   Encounter Date: 11/02/2019   PT End of Session - 11/02/19 1649    Visit Number 3    Number of Visits 6    Date for PT Re-Evaluation 11/08/19    Authorization Type BCBS with [facility]    PT Start Time 1619    PT Stop Time 1703    PT Time Calculation (min) 44 min    Activity Tolerance Patient tolerated treatment well    Behavior During Therapy [facility] for tasks assessed/performed           Past Medical History:  Diagnosis Date  . Allergic rhinitis   . Common migraine   . Hypertension     Past Surgical History:  Procedure Laterality Date  . CARPAL TUNNEL RELEASE Left 01/05/2016  . CARPAL TUNNEL RELEASE Right 04/2018  . TYMPANOSTOMY TUBE PLACEMENT      There were no vitals filed for this visit.   Subjective Assessment - 11/02/19 1628    Subjective Pt. noting good benefit from PT exercises.  Pain worst now in mornings.    Diagnostic tests xrays DDD of spine,    Patient Stated Goals Get rid of pain and not limp when she first stands up and lie on Lt side.    Currently in Pain? No/denies    Pain Score 0-No pain   pain rising 6/10 at worst in mornings   Pain Location Hip    Pain Orientation Left;Lateral    Pain Type Acute pain    Pain Radiating Towards pain radiating into L lateral LE    Aggravating Factors  mornings and standing after prolonged sitting    Pain Relieving Factors walking, PT exercise,    Multiple Pain Sites No              [facility] PT Assessment - 11/02/19 0001      Strength   Strength Assessment Site Hip    Right/Left Hip Left    Left Hip Flexion 4+/5    Left Hip Extension 4/5    Left Hip ABduction 3+/5                          [facility] Adult PT Treatment/Exercise - 11/02/19 0001      Knee/Hip Exercises: Stretches   ITB Stretch Both;30 seconds    ITB Stretch Limitations cross body with strap    Piriformis Stretch Both;30 seconds    Piriformis Stretch Limitations supine figure 4    Other Knee/Hip Stretches L SKTC stretch x 30 sec      Knee/Hip Exercises: Aerobic   Recumbent Bike L1 x 6 min      Knee/Hip Exercises: Supine   Bridges Both;Strengthening   x 12      Knee/Hip Exercises: Sidelying   Clams L 10 x 3" yellow TB at knees       Manual Therapy   Manual Therapy Myofascial release;Soft tissue mobilization    Manual therapy comments R sidelying     Soft tissue mobilization DTM to L glute med, piriformis, glute max    Myofascial Release Manual TPR to L glute med, piriformis  PT Education - 11/02/19 1717    Education Details HEP update; seated piriformis stretch, sidelying clam shell and bridge/abduction with yellow TB at knees    Person(s) Educated Patient    Methods Explanation;Demonstration;Verbal cues;Handout    Comprehension Verbalized understanding;Returned demonstration;Verbal cues required               PT Long Term Goals - 11/02/19 1709      PT LONG TERM GOAL #1   Title I with advanced HEP    Time 6    Period Weeks    Status Partially Met      PT LONG TERM GOAL #2   Title report no more than 1/10 pain in the Lt LE with transitioning sit to stand    Time 6    Period Weeks    Status On-going   07/29: up to 6/10 at times down from initial 8/10     PT LONG TERM GOAL #3   Title increase Lt hip strength =/> 5-/5 to allow return to normal activitiy    Time 6    Period Weeks    Status On-going      PT LONG TERM GOAL #4   Title report ability to sleep per her baseline on the Lt side    Time 6    Period Weeks    Status Partially Met   07/29: has to reposition after a few minutes of sleeping on L side     PT LONG TERM GOAL #5    Title report =/> 75% improvement in her ability to donn shoes and socks    Time 6    Period Weeks    Status Partially Met   07/29: notes 50% improvement                Plan - 11/02/19 1650    Clinical Impression Statement Pt. noting 50% improvement in ability to don/doff shoes.  LTG #5 partially met.  all other LTGs ongoing at this time however pt. wanting to go on hold from therapy pending figuring out her work schedule in the upcoming two weeks.  Patient does note good benefit from therapy however also concerned about continuing with therapy due to financial reasons.  Does demonstrate improved proximal hip strength with MMT evident (see flowsheet).  MT addressed ongoing tension/tightness in L lateral glutes with mild improvement noted.  updated HEP to focus on remaining deficits and patient requesting to go on 30-day hold from therapy at this time.    Comorbidities bilat carpal tunnel release, HTN, lumbar DJD, allergies    Rehab Potential Excellent    PT Frequency 1x / week    PT Treatment/Interventions Iontophoresis 19m/ml Dexamethasone;Taping;Patient/family education;Moist Heat;Functional mobility training;Therapeutic activities;Ultrasound;Cryotherapy;Electrical Stimulation;Balance training;Manual techniques;Dry needling;Therapeutic exercise    PT Next Visit Plan 30-day hold pending patient work schedule    Consulted and Agree with Plan of Care Patient           Patient will benefit from skilled therapeutic intervention in order to improve the following deficits and impairments:  Increased muscle spasms, Pain, Hypomobility, Impaired flexibility, Decreased strength  Visit Diagnosis: Chronic bilateral low back pain with left-sided sciatica  Pain in left hip  Muscle weakness (generalized)  Other muscle spasm     Problem List Patient Active Problem List   Diagnosis Date Noted  . Acute left lumbar radiculopathy 09/14/2019  . Left tibialis posterior tendinitis 06/29/2019   . Hyperglycemia 10/05/2018  . Hypertension 05/10/2018  . Abnormality of gait 06/30/2016  .  Arthritis of midfoot 06/02/2016  . PCP NOTES >>>> 03/18/2015  . CTS (carpal tunnel syndrome) 08/01/2014  . Eczema, ear canal 05/15/2014  . Eustachian tube dysfunction 04/08/2014  . Annual physical exam 02/19/2014  . DJD (degenerative joint disease) 01/15/2012  . Fatigue 10/31/2010  . Menopause 10/31/2010  . ALLERGIC RHINITIS 01/07/2007  . COMMON MIGRAINE 12/15/2006    Bess Harvest, PTA 11/02/19 6:04 PM   [place] [address]  [place], [place], [address] Phone: [phone]   Fax:  [phone]  Name: Lori Goodwin MRN: [id number] Date of Birth: 09-18-1945   PHYSICAL THERAPY DISCHARGE SUMMARY  Visits from Start of Care: 3  Current functional level related to goals / functional outcomes:   Refer to above clinical impression for status as of last visit on 11/02/2019. Patient was placed on hold for 30 days and has not needed to return to PT, therefore will proceed with discharge from PT for this episode.   Remaining deficits:   As above. Pt did not return within 30-day hold window.   Education / Equipment:   HEP  Plan: Patient agrees to discharge.  Patient goals were partially met. Patient is being discharged due to not returning since the last visit.  ?????     Percival Spanish, PT, MPT 12/04/19, 3:38 PM  [facility] [address]  [place], [place], [address] Phone: [phone]   Fax:  [phone]

## 2019-11-10 ENCOUNTER — Encounter: Payer: Self-pay | Admitting: Internal Medicine

## 2019-11-10 ENCOUNTER — Other Ambulatory Visit: Payer: Self-pay

## 2019-11-10 ENCOUNTER — Ambulatory Visit (INDEPENDENT_AMBULATORY_CARE_PROVIDER_SITE_OTHER): Payer: Medicare Other | Admitting: Internal Medicine

## 2019-11-10 VITALS — BP 178/84 | HR 50 | Temp 98.1°F | Resp 18 | Ht 63.0 in | Wt 162.1 lb

## 2019-11-10 DIAGNOSIS — M1711 Unilateral primary osteoarthritis, right knee: Secondary | ICD-10-CM | POA: Diagnosis not present

## 2019-11-10 DIAGNOSIS — Z0000 Encounter for general adult medical examination without abnormal findings: Secondary | ICD-10-CM

## 2019-11-10 DIAGNOSIS — I10 Essential (primary) hypertension: Secondary | ICD-10-CM | POA: Diagnosis not present

## 2019-11-10 DIAGNOSIS — R739 Hyperglycemia, unspecified: Secondary | ICD-10-CM | POA: Diagnosis not present

## 2019-11-10 LAB — CBC WITH DIFFERENTIAL/PLATELET
Basophils Absolute: 0 10*3/uL (ref 0.0–0.1)
Basophils Relative: 0.9 % (ref 0.0–3.0)
Eosinophils Absolute: 0.2 10*3/uL (ref 0.0–0.7)
Eosinophils Relative: 4.6 % (ref 0.0–5.0)
HCT: 37.8 % (ref 36.0–46.0)
Hemoglobin: 12.8 g/dL (ref 12.0–15.0)
Lymphocytes Relative: 44.5 % (ref 12.0–46.0)
Lymphs Abs: 2.2 10*3/uL (ref 0.7–4.0)
MCHC: 34 g/dL (ref 30.0–36.0)
MCV: 92.2 fl (ref 78.0–100.0)
Monocytes Absolute: 0.3 10*3/uL (ref 0.1–1.0)
Monocytes Relative: 6.4 % (ref 3.0–12.0)
Neutro Abs: 2.2 10*3/uL (ref 1.4–7.7)
Neutrophils Relative %: 43.6 % (ref 43.0–77.0)
Platelets: 211 10*3/uL (ref 150.0–400.0)
RBC: 4.1 Mil/uL (ref 3.87–5.11)
RDW: 12.3 % (ref 11.5–15.5)
WBC: 5 10*3/uL (ref 4.0–10.5)

## 2019-11-10 LAB — COMPREHENSIVE METABOLIC PANEL
ALT: 20 U/L (ref 0–35)
AST: 19 U/L (ref 0–37)
Albumin: 4.2 g/dL (ref 3.5–5.2)
Alkaline Phosphatase: 65 U/L (ref 39–117)
BUN: 17 mg/dL (ref 6–23)
CO2: 27 mEq/L (ref 19–32)
Calcium: 9.7 mg/dL (ref 8.4–10.5)
Chloride: 106 mEq/L (ref 96–112)
Creatinine, Ser: 0.71 mg/dL (ref 0.40–1.20)
GFR: 80.63 mL/min (ref >60.00)
Glucose, Bld: 106 mg/dL — ABNORMAL HIGH (ref 70–99)
Potassium: 4.1 mEq/L (ref 3.5–5.1)
Sodium: 137 mEq/L (ref 135–145)
Total Bilirubin: 0.5 mg/dL (ref 0.2–1.2)
Total Protein: 6.9 g/dL (ref 6.0–8.3)

## 2019-11-10 LAB — LIPID PANEL
Cholesterol: 175 mg/dL (ref 0–200)
HDL: 56.5 mg/dL (ref >39.00)
LDL Cholesterol: 103 mg/dL — ABNORMAL HIGH (ref 0–99)
NonHDL: 118.88
Total CHOL/HDL Ratio: 3
Triglycerides: 80 mg/dL (ref 0.0–149.0)
VLDL: 16 mg/dL (ref 0.0–40.0)

## 2019-11-10 LAB — HEMOGLOBIN A1C: Hgb A1c MFr Bld: 6 % (ref 4.6–6.5)

## 2019-11-10 MED ORDER — TETANUS-DIPHTH-ACELL PERTUSSIS 5-2.5-18.5 LF-MCG/0.5 IM SUSP: 0.5000 mL | Freq: Once | INTRAMUSCULAR | 0 refills | Status: AC

## 2019-11-10 MED FILL — BOOSTRIX VACCINE SYRINGE: 5-2.5-18.5 | 1 days supply | Qty: 1 | Fill #0

## 2019-11-10 NOTE — Progress Notes (Signed)
Subjective:    Patient ID: Lori Goodwin, female    DOB: 1945/04/27, 74 y.o.   MRN: [id number]  DOS:  11/10/2019 Type of visit - description: Routine checkup Here for management of chronic problems  Also, she developed left leg pain, saw sports medicine, did 3 PT  visits. Become more active, left leg pain decreased. About 3 to 4 days ago developed pain around the right knee, also some leg swelling. The pain is a steady and worse when she walks. Denies back pain per se.  No lower extremity paresthesias.  Ambulatory BPs in the 140, 138 range.  BP Readings from Last 3 Encounters:  11/10/19 (!) 178/84  09/14/19 104/80  06/29/19 110/80     Review of Systems Denies chest pain or difficulty breathing No palpitations No cough  Past Medical History:  Diagnosis Date  . Allergic rhinitis   . Common migraine   . Hypertension     Past Surgical History:  Procedure Laterality Date  . CARPAL TUNNEL RELEASE Left 01/05/2016  . CARPAL TUNNEL RELEASE Right 04/2018  . TYMPANOSTOMY TUBE PLACEMENT      Allergies as of 11/10/2019      Reactions   Tylenol [acetaminophen] Shortness Of Breath      Medication List       Accurate as of November 10, 2019  9:38 AM. If you have any questions, ask your nurse or doctor.        amLODipine 5 MG tablet Commonly known as: NORVASC Take 1 tablet (5 mg total) by mouth daily.   clobetasol 0.05 % external solution Commonly known as: TEMOVATE Apply topically daily as needed.   gabapentin 100 MG capsule Commonly known as: NEURONTIN Take 2 capsules (200 mg total) by mouth at bedtime.   multivitamin with minerals Tabs tablet Take 1 tablet by mouth daily.   propranolol ER 60 MG 24 hr capsule Commonly known as: INDERAL LA Take 1 capsule (60 mg total) by mouth daily.   SUMAtriptan 50 MG tablet Commonly known as: IMITREX TAKE 1-2 TABLETS BY MOUTH AS NEEDED FOR MIGRAINE. MAY REPEAT IN 2 HOURS IF HEADACHE PERSISTS OR RECURS   Vitamin D 50 MCG  (2000 UT) Caps Take 2 capsules by mouth.          Objective:   Physical Exam BP (!) 184/78 (BP Location: Left Arm, Patient Position: Sitting, Cuff Size: Small)   Pulse (!) 50   Temp 98.1 F (36.7 C) (Oral)   Resp 18   Ht 5\' 3"  (1.6 m)   Wt 162 lb 2 oz (73.5 kg)   SpO2 99%   BMI 28.72 kg/m  General:   Well developed, NAD, BMI noted. HEENT:  Normocephalic . Face symmetric, atraumatic Lungs:  CTA B Normal respiratory effort, no intercostal retractions, no accessory muscle use. Heart: RRR,  no murmur.  Lower extremities:  Calves measured: Circumference is to 3 mm larger on the right, calf is soft and not TTP. Both knees have bony changes consistent with DJD Right knee: Small effusion?  No red, not warm. MSK: No TTP at the lower back Skin: Not pale. Not jaundice Neurologic:  alert & oriented X3.  Speech normal, gait appropriate for age and unassisted Motor symmetric DTRs: Slightly decreased right ankle jerk otherwise symmetric Psych--  Cognition and judgment appear intact.  Cooperative with normal attention span and concentration.  Behavior appropriate. No anxious or depressed appearing.      Assessment    Assessment  Prediabetes: A1c 6.0 (03-2015)  HTN (on BB) MSK: --DJD --Neck pain: Saw neurosurgery, MRI was done, DX DJD, nonsurgical candidate (2013 ) --s/p L & R CTS surgery  Osteopenia first  T score -1.2 (06/2017) rx ca and vit D  Migraines  (onn BB and imitrex) Ear canal eczema: topical steroids prn  PLAN: Prediabetes: Check A1c HTN: On amlodipine 5 mg (Inderal is only as needed for headache, has not taken it lately).  BP at home 140s, 130s.  Here at the office is elevated for the first time (recheck BP 178/84).  We will check CMP, FLP, CBC, monitor BPs at home, call in 2 weeks. Migraines: Lately not an issue, Inderal as needed only. Right knee pain: Reports right knee pain right leg swelling, on exam there is minimal if any swelling (clinically  no  evidence of DVT).  Suspect pain is due to DJD secondary to increased activity (doing PT). Rec  ice, Tylenol, bracing, if no better consider see Dr. Tamala Julian again. Preventive care reviewed: Tdap printed S/p Shingrix Had Covid vaccinations Encourage flu shot this fall Due for colonoscopy 01-2020, will wait for GI letter RTC 3 months CPX      This visit occurred during the SARS-CoV-2 public health emergency.  Safety protocols were in place, including screening questions prior to the visit, additional usage of staff PPE, and extensive cleaning of exam room while observing appropriate contact time as indicated for disinfecting solutions.

## 2019-11-10 NOTE — Patient Instructions (Addendum)
Please schedule Medicare Wellness with Glenard Haring.    Your blood pressure is elevated today at the office, please check at home twice a week and call me with your readings in 2 weeks. BP GOAL is between 110/65 and  135/85.  Knee pain probably from arthritis Rest, ice, Tylenol as needed, knee brace.  If not improving in the next few days please call Dr. Tamala Julian.  Flu shot this fall.  Proceed with a tetanus shot     GO TO THE LAB : Get the blood work     [place], [place] Come back for a physical exam in 3 months

## 2019-11-10 NOTE — Progress Notes (Signed)
Pre visit review using our clinic review tool, if applicable. No additional management support is needed unless otherwise documented below in the visit note. 

## 2019-11-11 NOTE — Assessment & Plan Note (Signed)
Prediabetes: Check A1c HTN: On amlodipine 5 mg (Inderal is only as needed for headache, has not taken it lately).  BP at home 140s, 130s.  Here at the office is elevated for the first time (recheck BP 178/84).  We will check CMP, FLP, CBC, monitor BPs at home, call in 2 weeks. Migraines: Lately not an issue, Inderal as needed only. Right knee pain: Reports right knee pain right leg swelling, on exam there is minimal if any swelling (clinically  no evidence of DVT).  Suspect pain is due to DJD secondary to increased activity (doing PT). Rec  ice, Tylenol, bracing, if no better consider see Dr. Tamala Julian again. Preventive care reviewed: Tdap printed S/p Shingrix Had Covid vaccinations Encourage flu shot this fall Due for colonoscopy 01-2020, will wait for GI letter RTC 3 months CPX

## 2019-11-15 ENCOUNTER — Other Ambulatory Visit: Payer: Self-pay

## 2019-11-15 ENCOUNTER — Encounter: Payer: Self-pay | Admitting: Family Medicine

## 2019-11-15 ENCOUNTER — Ambulatory Visit (INDEPENDENT_AMBULATORY_CARE_PROVIDER_SITE_OTHER): Payer: Medicare Other | Admitting: Family Medicine

## 2019-11-15 ENCOUNTER — Ambulatory Visit (INDEPENDENT_AMBULATORY_CARE_PROVIDER_SITE_OTHER): Payer: Medicare Other

## 2019-11-15 VITALS — BP 116/80 | HR 56 | Ht 63.0 in | Wt 163.0 lb

## 2019-11-15 DIAGNOSIS — M25561 Pain in right knee: Secondary | ICD-10-CM

## 2019-11-15 DIAGNOSIS — M5416 Radiculopathy, lumbar region: Secondary | ICD-10-CM

## 2019-11-15 DIAGNOSIS — M1711 Unilateral primary osteoarthritis, right knee: Secondary | ICD-10-CM | POA: Diagnosis not present

## 2019-11-15 NOTE — Patient Instructions (Signed)
Xray right knee Brace during activity Ice 20 minutes 2x a day Pennsaid 2x a day 50% of walking you were doing before begin next week and increase 25% next week Will check in 6 weeks

## 2019-11-15 NOTE — Progress Notes (Signed)
[place] Phone: [phone] Subjective:    I'm seeing this patient by the request  of:  Colon Branch, MD  CC: Low back and leg pain follow-up  GHW:[id number]   6/10/201 Patient is some signs and symptoms consistent with a lumbar radiculopathy but differential also includes more of a greater trochanteric bursitis.  Discussed the potential for an injection that would be diagnostic and therapeutic which patient declined but was very adamant she would like to try physical therapy.  Patient will be referred today.  Started on low-dose gabapentin.  X-rays are increasing at the moment.  Follow-up again in 4 to 8 weeks  Update 11/15/2019 Lori Goodwin is a 74 y.o. female coming in with complaint of Right leg pain. Since starting PT for L leg patient is having severe pain in Right leg making it hard to walk. Has been using ice and advil which has helped but not a lot. Locates pain to right above the knee. Is constantly aching and worse when walking better when sitting or laying.     Patient did have x-rays done in June 2021.  Independently visualized by me showing 6 mm right lateral listhesis at L3 on L4 multilevel degenerative changes otherwise  Past Medical History:  Diagnosis Date  . Allergic rhinitis   . Common migraine   . Hypertension    Past Surgical History:  Procedure Laterality Date  . CARPAL TUNNEL RELEASE Left 01/05/2016  . CARPAL TUNNEL RELEASE Right 04/2018  . TYMPANOSTOMY TUBE PLACEMENT     Social History   Socioeconomic History  . Marital status: Married    Spouse name: Not on file  . Number of children: 3  . Years of education: Not on file  . Highest education level: Not on file  Occupational History  . Occupation: [name], 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    Employer: [employer]  Tobacco Use  . Smoking status: Never Smoker  . Smokeless tobacco: Never Used  Substance and Sexual Activity  .  Alcohol use: No  . Drug use: No  . Sexual activity: Not on file  Other Topics Concern  . Not on file  Social History Narrative   [place] from [place]   Divorced, remarried   Lives w/ husband   [name] her son lives in [place]     3 children ,1 son is paraplegic , has another that is a MD,other a PA     Social Determinants of Radio broadcast assistant Strain:   . Difficulty of Paying Living Expenses:   Food Insecurity:   . Worried About Charity fundraiser in the Last Year:   . Arboriculturist in the Last Year:   Transportation Needs:   . Film/video editor (Medical):   [name] Lack of Transportation (Non-Medical):   Physical Activity:   . Days of Exercise per Week:   . Minutes of Exercise per Session:   Stress:   . Feeling of Stress :   Social Connections:   . Frequency of Communication with Friends and Family:   . Frequency of Social Gatherings with Friends and Family:   . Attends Religious Services:   . Active Member of Clubs or Organizations:   . Attends Archivist Meetings:   [name] Marital Status:    Allergies  Allergen Reactions  . Tylenol [Acetaminophen] Shortness Of Breath   Family History  Problem Relation Age of Onset  .  Colon cancer Neg Hx   . Breast cancer Neg Hx   . Diabetes Neg Hx   . Heart attack Neg Hx      Current Outpatient Medications (Cardiovascular):  .  amLODipine (NORVASC) 5 MG tablet, Take 1 tablet (5 mg total) by mouth daily. .  propranolol ER (INDERAL LA) 60 MG 24 hr capsule, Take 1 capsule (60 mg total) by mouth daily. (Patient not taking: Reported on 11/10/2019)   Current Outpatient Medications (Analgesics):  [name]  SUMAtriptan (IMITREX) 50 MG tablet, TAKE 1-2 TABLETS BY MOUTH AS NEEDED FOR MIGRAINE. MAY REPEAT IN 2 HOURS IF HEADACHE PERSISTS OR RECURS   Current Outpatient Medications (Other):  [name]  Cholecalciferol (VITAMIN D) 2000 units CAPS, Take 2 capsules by mouth. .  clobetasol (TEMOVATE) 0.05 % external solution, Apply  topically daily as needed. .  Multiple Vitamin (MULTIVITAMIN WITH MINERALS) TABS, Take 1 tablet by mouth daily.   Reviewed prior external information including notes and imaging from  primary care provider As well as notes that were available from care everywhere and other healthcare systems.  Past medical history, social, surgical and family history all reviewed in electronic medical record.  No pertanent information unless stated regarding to the chief complaint.   Review of Systems:  No headache, visual changes, nausea, vomiting, diarrhea, constipation, dizziness, abdominal pain, skin rash, fevers, chills, night sweats, weight loss, swollen lymph nodes, body aches, joint swelling, chest pain, shortness of breath, mood changes. POSITIVE muscle aches  Objective  Blood pressure 116/80, pulse (!) 56, height 5\' 3"  (1.6 m), weight 163 lb (73.9 kg), SpO2 98 %.   General: No apparent distress alert and oriented x3 mood and affect normal, dressed appropriately.  HEENT: Pupils equal, extraocular movements intact  Respiratory: Patient's speak in full sentences and does not appear short of breath  Cardiovascular: No lower extremity edema, non tender, no erythema  Neuro: Cranial nerves II through XII are intact, neurovascularly intact in all extremities with 2+ DTRs and 2+ pulses.  Gait antalgic MSK: Moderate arthritic changes of multiple joints Right knee exam does show some arthritic changes.  Trace effusion compared to the contralateral side.  Positive patellar grind test with lateral tracking of the kneecap noted.  Lacks the last 10 degrees of flexion.  Mild instability with valgus and varus force   Impression and Recommendations:     The above documentation has been reviewed and is accurate and complete [name], DO       Note: This dictation was prepared with Dragon dictation along with smaller phrase technology. Any transcriptional errors that result from this process are  unintentional.

## 2019-11-16 ENCOUNTER — Encounter: Payer: Self-pay | Admitting: Family Medicine

## 2019-11-16 DIAGNOSIS — M1711 Unilateral primary osteoarthritis, right knee: Secondary | ICD-10-CM | POA: Insufficient documentation

## 2019-11-16 NOTE — Assessment & Plan Note (Signed)
Patient seems to be doing remarkably better at this time.  No significant change in management patient feels like she is feeling much better

## 2019-11-16 NOTE — Assessment & Plan Note (Signed)
Patient has been in formal physical therapy but now is having contralateral leg pain.  Seems to be more secondary to the patellofemoral.  Discussed with patient in great length about this.  Discussed bracing, home exercise, icing regimen.  Patient had x-rays again today.  Patient has done very well with formal physical therapy and will consider this as well to add on if necessary.  Follow-up in 6 weeks

## 2019-11-29 ENCOUNTER — Other Ambulatory Visit: Payer: Self-pay

## 2019-11-29 DIAGNOSIS — M1711 Unilateral primary osteoarthritis, right knee: Secondary | ICD-10-CM

## 2019-12-07 ENCOUNTER — Other Ambulatory Visit: Payer: Self-pay

## 2019-12-07 ENCOUNTER — Ambulatory Visit: Payer: Medicare Other | Attending: Family Medicine | Admitting: Physical Therapy

## 2019-12-07 DIAGNOSIS — M5442 Lumbago with sciatica, left side: Secondary | ICD-10-CM | POA: Diagnosis not present

## 2019-12-07 DIAGNOSIS — R262 Difficulty in walking, not elsewhere classified: Secondary | ICD-10-CM | POA: Insufficient documentation

## 2019-12-07 DIAGNOSIS — G8929 Other chronic pain: Secondary | ICD-10-CM | POA: Insufficient documentation

## 2019-12-07 DIAGNOSIS — M25661 Stiffness of right knee, not elsewhere classified: Secondary | ICD-10-CM | POA: Diagnosis not present

## 2019-12-07 DIAGNOSIS — M25561 Pain in right knee: Secondary | ICD-10-CM | POA: Diagnosis not present

## 2019-12-07 DIAGNOSIS — M6281 Muscle weakness (generalized): Secondary | ICD-10-CM | POA: Diagnosis not present

## 2019-12-07 NOTE — Therapy (Signed)
[facility] [place] [address]  [place], [place], [address] Phone: [phone]   Fax:  [phone]  Physical Therapy Evaluation  Patient Details  Name: Lori Goodwin MRN: [id number] Date of Birth: [birth year] Referring Provider (PT): [name], DO   Encounter Date: 12/07/2019   PT End of Session - 12/07/19 0805    Visit Number 1    Number of Visits 8    Date for PT Re-Evaluation 02/01/20    Authorization Type Medicare + BCBS    PT Start Time 0805    PT Stop Time 0855    PT Time Calculation (min) 50 min    Activity Tolerance Patient tolerated treatment well    Behavior During Therapy [facility] for tasks assessed/performed           Past Medical History:  Diagnosis Date  . Allergic rhinitis   . Common migraine   . Hypertension     Past Surgical History:  Procedure Laterality Date  . CARPAL TUNNEL RELEASE Left 01/05/2016  . CARPAL TUNNEL RELEASE Right 04/2018  . TYMPANOSTOMY TUBE PLACEMENT      There were no vitals filed for this visit.    Subjective Assessment - 12/07/19 0809    Subjective Pt reports ~1 month ago she suddenly started having pain in her R knee after standing for a prolonged period. MD told her she had OA per x-ray and was told to take a week break from walking then ease back in at half her normal distance. She was also given exercises which seem to help - pain better but not resolved. She was given a knee brace which she currently wears all day while at school. Notes increased pain and swelling in R ankle by end of the day.    Limitations Standing;Walking    How long can you stand comfortably? 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 20 minutes    Diagnostic tests R knee x-ray 11/15/19 - Mild patellofemoral and medial compartmental osteoarthritis.    Patient Stated Goals "Want to be walking like I used to without  pain or need for brace"    Currently in Pain? Yes    Pain Score 2    up to 4-5/10 by end of day   Pain Location Knee    Pain Orientation Right    Pain Descriptors / Indicators Aching    Pain Type Acute pain    Pain Radiating Towards n/a    Pain Onset More than a month ago    Pain Frequency Constant    Aggravating Factors  prolonged standing or walking    Pain Relieving Factors massaging, ice pack 20 min - 2x/day    Effect of Pain on Daily Activities limits her walking - currently only walking 20 min in the evening but normally would walk for 1 hr    Multiple Pain Sites Yes    Pain Score 5    Pain Location Leg   L shin   Pain Orientation Left;Lower    Pain Descriptors / Indicators Aching    Pain Type Acute pain    Pain Onset More than a month ago   ~2 months   Pain Frequency Intermittent    Aggravating Factors  most commonly bother her a night    Pain Relieving Factors massage    Effect of Pain  on Daily Activities intereferes with sleep at times              [facility] PT Assessment - 12/07/19 0805      Assessment   Medical Diagnosis R knee patellofemoral arthritis    Referring Provider (PT) [name], DO    Onset Date/Surgical Date --   ~1 month   Next MD Visit 01/03/20    Prior Therapy PT for LBP & L LE radiculopathy earlier this year      Precautions   Precautions None      Balance Screen   Has the patient fallen in the past 6 months No    Has the patient had a decrease in activity level because of a fear of falling?  No    Is the patient reluctant to leave their home because of a fear of falling?  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    Leisure walking, gardening       Observation/Other Assessments   Focus on Therapeutic Outcomes (FOTO)  Knee - 65% (35% limitation); Predicted 74% (26% limitation)      Sensation   Light Touch Appears Intact      AROM   AROM Assessment Site Knee    Right/Left Hip --    Right/Left Knee Right;Left    Right Knee Extension -2    Right Knee Flexion 120    Left Knee Extension -6    Left Knee Flexion 130      Strength   Strength Assessment Site Hip;Knee;Ankle    Right/Left Hip Right;Left    Right Hip Flexion 4-/5    Right Hip Extension 4-/5    Right Hip External Rotation  4-/5    Right Hip Internal Rotation 4/5    Right Hip ABduction 4/5    Right Hip ADduction 4-/5    Left Hip Flexion 4+/5    Left Hip Extension 4-/5    Left Hip External Rotation 4/5    Left Hip Internal Rotation 4/5    Left Hip ABduction 4/5    Left Hip ADduction 4/5    Right/Left Knee Right;Left    Right Knee Flexion 4/5    Right Knee Extension 4+/5    Left Knee Flexion 5/5    Left Knee Extension 5/5    Right/Left Ankle Right;Left    Right Ankle Dorsiflexion 4/5    Right Ankle Plantar Flexion 5/5    Left Ankle Dorsiflexion 4+/5    Left Ankle Plantar Flexion 4/5      Flexibility   Hamstrings mild tight B    Quadriceps mild tight R>L    ITB mild/mod tight B    Piriformis mod tight R                      Objective measurements completed on examination: See above findings.               PT Education - 12/07/19 1328    Education Details PT eval findings, anticipated POC, initiation of current HEP review (pt to bring handouts to next visit)    Person(s)  Educated Patient    Methods Explanation    Comprehension Verbalized understanding               PT Long Term Goals - 12/07/19 1346      PT LONG TERM GOAL #1   Title Patient will be independent with ongoing/advanced HEP    Status New    Target Date 02/01/20      PT LONG TERM GOAL #2   Title Patient to improve R knee AROM equivalent to L without pain  provocation    Status New    Target Date 02/01/20      PT LONG TERM GOAL #3   Title Patient will demonstrate improved B LE strength to >/= 4+/5 for improved stability and ease of mobility    Status New    Target Date 02/01/20      PT LONG TERM GOAL #4   Title Patient will improve walking tolerance w/o knee brace to >/= 45-60 minutes w/o pain interference to allow resumption of normal daily exercise routine    Status New    Target Date 02/01/20                  Plan - 12/07/19 0854    Clinical Impression Statement Lori Goodwin is a 74 y/o female who presents to OP PT for acute onset of R knee pain ~1 month after an episode of prolonged standing. She is observed to stand with knees locked into hyperextension and admits to this being a habit of hers. She recently completed 3 PT visits for L leg pain attributed to lumbar radiculopathy vs hip bursitis and states that this pain is mostly better but still notes ttp over the L greater trochanter. Current deficits include decreased R knee ROM, increased proximal LE muscle tightness and mild LE weakness, R>L. Pain prevents her from walking her normal 1 hour/day (currently limited to ~20 minutes while wearing knee brace). Lori Goodwin will benefit from skilled PT intervention to address the above listed deficits, reduce pain, and restore functional strength to allow for improved knee stability and improved walking tolerance w/o need for brace.    Personal Factors and Comorbidities Comorbidity 3+;Time since onset of injury/illness/exacerbation;Past/Current Experience;Age    Comorbidities B carpal tunnel release, HTN, lumbar DJD, midfoot OA, L anterior tibialis pain/tendinitis, allergies    Examination-Activity Limitations Locomotion Level;Stand;Sleep    Examination-Participation Restrictions Occupation;Community Activity    Stability/Clinical Decision Making Stable/Uncomplicated    Clinical Decision Making Low    Rehab Potential Excellent    PT  Frequency 1x / week    PT Duration 8 weeks    PT Treatment/Interventions ADLs/Self Care Home Management;Cryotherapy;Electrical Stimulation;Iontophoresis 4mg /ml Dexamethasone;Moist Heat;Ultrasound;Gait training;Stair training;Functional mobility training;Therapeutic activities;Therapeutic exercise;Balance training;Neuromuscular re-education;Patient/family education;Manual techniques;Passive range of motion;Dry needling;Taping;Vasopneumatic Device;Joint Manipulations    PT Next Visit Plan Review current HEP (from MD & carryover from recent PT epsiode)    Consulted and Agree with Plan of Care Patient           Patient will benefit from skilled therapeutic intervention in order to improve the following deficits and impairments:  Abnormal gait, Decreased activity tolerance, Decreased balance, Decreased endurance, Decreased range of motion, Decreased strength, Difficulty walking, Increased edema, Increased muscle spasms, Increased fascial restricitons, Impaired perceived functional ability, Pain  Visit Diagnosis: Acute pain of right knee  Stiffness of right knee, not elsewhere classified  Muscle weakness (generalized)  Difficulty in walking, not elsewhere classified     Problem List Patient Active Problem List  Diagnosis Date Noted  . Patellofemoral arthritis of right knee 11/16/2019  . Acute left lumbar radiculopathy 09/14/2019  . Left tibialis posterior tendinitis 06/29/2019  . Hyperglycemia 10/05/2018  . Hypertension 05/10/2018  . Abnormality of gait 06/30/2016  . Arthritis of midfoot 06/02/2016  . PCP NOTES >>>> 03/18/2015  . CTS (carpal tunnel syndrome) 08/01/2014  . Eczema, ear canal 05/15/2014  . Eustachian tube dysfunction 04/08/2014  . Annual physical exam 02/19/2014  . DJD (degenerative joint disease) 01/15/2012  . Fatigue 10/31/2010  . Menopause 10/31/2010  . ALLERGIC RHINITIS 01/07/2007  . COMMON MIGRAINE 12/15/2006    Percival Spanish, PT, MPT 12/07/2019, 1:52  PM  [facility] [address]  [place], [place], [address] Phone: [phone]   Fax:  [phone]  Name: Lori Goodwin MRN: [id number] Date of Birth: [birth year]

## 2019-12-15 ENCOUNTER — Ambulatory Visit: Payer: Medicare Other | Admitting: Physical Therapy

## 2019-12-15 ENCOUNTER — Encounter: Payer: Self-pay | Admitting: Physical Therapy

## 2019-12-15 ENCOUNTER — Other Ambulatory Visit: Payer: Self-pay

## 2019-12-15 DIAGNOSIS — M6281 Muscle weakness (generalized): Secondary | ICD-10-CM | POA: Diagnosis not present

## 2019-12-15 DIAGNOSIS — R262 Difficulty in walking, not elsewhere classified: Secondary | ICD-10-CM | POA: Diagnosis not present

## 2019-12-15 DIAGNOSIS — M25661 Stiffness of right knee, not elsewhere classified: Secondary | ICD-10-CM

## 2019-12-15 DIAGNOSIS — M25561 Pain in right knee: Secondary | ICD-10-CM | POA: Diagnosis not present

## 2019-12-15 DIAGNOSIS — M5442 Lumbago with sciatica, left side: Secondary | ICD-10-CM | POA: Diagnosis not present

## 2019-12-15 DIAGNOSIS — G8929 Other chronic pain: Secondary | ICD-10-CM | POA: Diagnosis not present

## 2019-12-15 NOTE — Therapy (Signed)
[facility] [place] [address]  [place], [place], [address] Phone: [phone]   Fax:  [phone]  Physical Therapy Treatment  Patient Details  Name: Lori Goodwin MRN: [id number] Date of Birth: 27-Sep-1945 Referring Provider (PT): [name], DO   Encounter Date: 12/15/2019   PT End of Session - 12/15/19 0805    Visit Number 2    Number of Visits 8    Date for PT Re-Evaluation 02/01/20    Authorization Type Medicare + BCBS    PT Start Time 0805    PT Stop Time 0845    PT Time Calculation (min) 40 min    Activity Tolerance Patient tolerated treatment well    Behavior During Therapy [facility] for tasks assessed/performed           Past Medical History:  Diagnosis Date   Allergic rhinitis    Common migraine    Hypertension     Past Surgical History:  Procedure Laterality Date   CARPAL TUNNEL RELEASE Left 01/05/2016   CARPAL TUNNEL RELEASE Right 04/2018   TYMPANOSTOMY TUBE PLACEMENT      There were no vitals filed for this visit.   Subjective Assessment - 12/15/19 0810    Subjective No new concerns today.    Diagnostic tests R knee x-ray 11/15/19 - Mild patellofemoral and medial compartmental osteoarthritis.    Patient Stated Goals "Want to be walking like I used to without pain or need for brace"    Currently in Pain? Yes    Pain Score 3     Pain Location Knee    Pain Orientation Right    Pain Descriptors / Indicators Aching    Pain Type Acute pain    Pain Frequency Constant    Pain Onset More than a month ago   ~2 months                            [facility] Adult PT Treatment/Exercise - 12/15/19 0805      Exercises   Exercises Knee/Hip      Knee/Hip Exercises: Stretches   Passive Hamstring Stretch Right;30 seconds;2 reps    ITB Stretch Right;30 seconds;2 reps    ITB Stretch Limitations cross body with strap    Piriformis Stretch Right;30 seconds    Piriformis Stretch  Limitations opp LE straight & ankle resting on opp knee - pt reporting better stretch with latter version      Knee/Hip Exercises: Aerobic   Recumbent Bike L1 x 5'      Knee/Hip Exercises: Supine   Short Arc Quad Sets Right;15 reps;Strengthening    Short Arc Quad Sets Limitations 8" FR + hip ADD pillow squeeze    Bridges Both;15 reps;Strengthening    Bridges Limitations cues to push through flat foot rather than heels to minimize HS cramping    Bridges with [employer] Both;15 reps;Strengthening      Knee/Hip Exercises: Sidelying   Hip ABduction Right;Strengthening   12 reps   Hip ABduction Limitations cues for extension bias to avoid pullling hip into flexion    Clams R 15 x 5" - red TB                  PT Education - 12/15/19 0845    Education Details Review of MD HEP as well as carryover from prior PT HEP + new/updated exercises added per Pt instructions    Person(s)  Educated Patient    Methods Explanation;Demonstration;Verbal cues;Handout    Comprehension Verbalized understanding;Verbal cues required;Returned demonstration;Need further instruction               PT Long Term Goals - 12/15/19 2119      PT LONG TERM GOAL #1   Title Patient will be independent with ongoing/advanced HEP    Status On-going    Target Date 02/01/20      PT LONG TERM GOAL #2   Title Patient to improve R knee AROM equivalent to L without pain provocation    Status On-going    Target Date 02/01/20      PT LONG TERM GOAL #3   Title Patient will demonstrate improved B LE strength to >/= 4+/5 for improved stability and ease of mobility    Status On-going    Target Date 02/01/20      PT LONG TERM GOAL #4   Title Patient will improve walking tolerance w/o knee brace to >/= 45-60 minutes w/o pain interference to allow resumption of normal daily exercise routine    Status On-going    Target Date 02/01/20                 Plan - 12/15/19 4174    Clinical Impression Statement  Therapy session focusing on review of MD HEP as well as HEP exercises which pt continues to perform from prior PT episode for LBP and L hip bursitis. Ramiya requiring clarification of technique with several exercises as well as minor modifications made to provide better stretches, promote improved activation of desired muscles and/or avoid substitution or erroneous motion during exercises as well as clarification of hold times, reps and sets. Patient able to perform good return demonstration of exercises reviewed today and was provided with updated HEP handout reflecting changes and progression of exercises. Patient to perform stretches daily with strengthening program qod.    Personal Factors and Comorbidities Comorbidity 3+;Time since onset of injury/illness/exacerbation;Past/Current Experience;Age    Comorbidities B carpal tunnel release, HTN, lumbar DJD, midfoot OA, L anterior tibialis pain/tendinitis, allergies    Examination-Activity Limitations Locomotion Level;Stand;Sleep    Examination-Participation Restrictions Occupation;Community Activity    Rehab Potential Excellent    PT Frequency 1x / week    PT Duration 8 weeks    PT Treatment/Interventions ADLs/Self Care Home Management;Cryotherapy;Electrical Stimulation;Iontophoresis 4mg /ml Dexamethasone;Moist Heat;Ultrasound;Gait training;Stair training;Functional mobility training;Therapeutic activities;Therapeutic exercise;Balance training;Neuromuscular re-education;Patient/family education;Manual techniques;Passive range of motion;Dry needling;Taping;Vasopneumatic Device;Joint Manipulations    PT Next Visit Plan proximal LE flexibility with manual therapy as indicated to address increased muscle tension/tone; R knee ROM; LE strengthening; taping and/or modalities PRN for pain    PT Home Exercise Plan 9/10 - HS, ITB & piriformis stretches, SAQ + VMO (hip ADD squeeze), bridge +/- hip ADD squeeze, s/l hip ABD, red TB s/l clam    Consulted and Agree  with Plan of Care Patient           Patient will benefit from skilled therapeutic intervention in order to improve the following deficits and impairments:  Abnormal gait, Decreased activity tolerance, Decreased balance, Decreased endurance, Decreased range of motion, Decreased strength, Difficulty walking, Increased edema, Increased muscle spasms, Increased fascial restricitons, Impaired perceived functional ability, Pain  Visit Diagnosis: Acute pain of right knee  Stiffness of right knee, not elsewhere classified  Muscle weakness (generalized)  Difficulty in walking, not elsewhere classified     Problem List Patient Active Problem List   Diagnosis Date Noted   Patellofemoral arthritis of  right knee 11/16/2019   Acute left lumbar radiculopathy 09/14/2019   Left tibialis posterior tendinitis 06/29/2019   Hyperglycemia 10/05/2018   Hypertension 05/10/2018   Abnormality of gait 06/30/2016   Arthritis of midfoot 06/02/2016   PCP NOTES >>>> 03/18/2015   CTS (carpal tunnel syndrome) 08/01/2014   Eczema, ear canal 05/15/2014   Eustachian tube dysfunction 04/08/2014   Annual physical exam 02/19/2014   DJD (degenerative joint disease) 01/15/2012   Fatigue 10/31/2010   Menopause 10/31/2010   ALLERGIC RHINITIS 01/07/2007   COMMON MIGRAINE 12/15/2006    Lori Goodwin, PT, MPT 12/15/2019, 10:35 AM  [facility] [address]  [place], [place], [address] Phone: [phone]   Fax:  [phone]  Name: Lori Goodwin MRN: [id number] Date of Birth: 10-09-1945

## 2019-12-15 NOTE — Patient Instructions (Signed)
    Home exercise program created by Aurelio Mccamy, PT.  For questions, please contact Kazumi Lachney via phone at [phone] or email at [email or web address]  [facility] [place] [address]  [address] [place], [place], [address] Phone: [phone]   Fax:  [phone]    

## 2019-12-22 ENCOUNTER — Other Ambulatory Visit: Payer: Self-pay

## 2019-12-22 ENCOUNTER — Ambulatory Visit: Payer: Medicare Other

## 2019-12-22 DIAGNOSIS — M6281 Muscle weakness (generalized): Secondary | ICD-10-CM

## 2019-12-22 DIAGNOSIS — G8929 Other chronic pain: Secondary | ICD-10-CM | POA: Diagnosis not present

## 2019-12-22 DIAGNOSIS — M25561 Pain in right knee: Secondary | ICD-10-CM

## 2019-12-22 DIAGNOSIS — R262 Difficulty in walking, not elsewhere classified: Secondary | ICD-10-CM

## 2019-12-22 DIAGNOSIS — M5442 Lumbago with sciatica, left side: Secondary | ICD-10-CM | POA: Diagnosis not present

## 2019-12-22 DIAGNOSIS — M25661 Stiffness of right knee, not elsewhere classified: Secondary | ICD-10-CM | POA: Diagnosis not present

## 2019-12-22 NOTE — Therapy (Signed)
[facility] [place] [address]  [place], [place], [address] Phone: [phone]   Fax:  [phone]  Physical Therapy Treatment  Patient Details  Name: Lori Goodwin MRN: [id number] Date of Birth: 1945/08/30 Referring Provider (PT): [name], DO   Encounter Date: 12/22/2019   PT End of Session - 12/22/19 0809    Visit Number 3    Number of Visits 8    Date for PT Re-Evaluation 02/01/20    Authorization Type Medicare + BCBS    PT Start Time 0802    PT Stop Time 0842    PT Time Calculation (min) 40 min    Activity Tolerance Patient tolerated treatment well    Behavior During Therapy [facility] for tasks assessed/performed           Past Medical History:  Diagnosis Date  . Allergic rhinitis   . Common migraine   . Hypertension     Past Surgical History:  Procedure Laterality Date  . CARPAL TUNNEL RELEASE Left 01/05/2016  . CARPAL TUNNEL RELEASE Right 04/2018  . TYMPANOSTOMY TUBE PLACEMENT      There were no vitals filed for this visit.   Subjective Assessment - 12/22/19 0810    Subjective Pt. doing well noting improved pain levels and reduced need to limp when walking.    Diagnostic tests R knee x-ray 11/15/19 - Mild patellofemoral and medial compartmental osteoarthritis.    Patient Stated Goals "Want to be walking like I used to without pain or need for brace"    Currently in Pain? No/denies    Pain Score 0-No pain    Pain Location Knee    Multiple Pain Sites No                             [facility] Adult PT Treatment/Exercise - 12/22/19 0001      Self-Care   Self-Care Other Self-Care Comments    Other Self-Care Comments  instruction in diference between HEP stretches and strengthening exercises and rationale for each activity as pt. asking for clarification; some increased time required due to pt. poor initial understanding; notes written on pt. original HEP handout to clarify for pt.  with good understanding following       Knee/Hip Exercises: Stretches   ITB Stretch Right;30 seconds;1 rep    ITB Stretch Limitations cross body with strap    Piriformis Stretch Right;30 seconds;1 rep    Piriformis Stretch Limitations opp LE straight & ankle resting on opp knee - pt reporting better stretch with latter version    Gastroc Stretch Right;Left;2 reps;30 seconds    Gastroc Stretch Limitations runner stretch into wall       Knee/Hip Exercises: Aerobic   Recumbent Bike L1 x 6'      Knee/Hip Exercises: Standing   Terminal Knee Extension Right;10 reps;Strengthening    Terminal Knee Extension Limitations wall TKE on wall       Knee/Hip Exercises: Supine   Short Arc Quad Sets Right;10 reps    Short Arc Quad Sets Limitations 2#; 8" FR    Bridges with [employer] Both;15 reps;Strengthening   cues for increased ROM                 PT Education - 12/22/19 0917    Education Details HEP update;  ball TKE on wall    Person(s) Educated Patient    Methods Explanation;Demonstration;Verbal cues;Handout  Comprehension Verbalized understanding;Returned demonstration;Verbal cues required               PT Long Term Goals - 12/15/19 0812      PT LONG TERM GOAL #1   Title Patient will be independent with ongoing/advanced HEP    Status On-going    Target Date 02/01/20      PT LONG TERM GOAL #2   Title Patient to improve R knee AROM equivalent to L without pain provocation    Status On-going    Target Date 02/01/20      PT LONG TERM GOAL #3   Title Patient will demonstrate improved B LE strength to >/= 4+/5 for improved stability and ease of mobility    Status On-going    Target Date 02/01/20      PT LONG TERM GOAL #4   Title Patient will improve walking tolerance w/o knee brace to >/= 45-60 minutes w/o pain interference to allow resumption of normal daily exercise routine    Status On-going    Target Date 02/01/20                 Plan - 12/22/19 0811     Clinical Impression Statement Lori Goodwin reporting she has been performing HEP however asking for clarification on which exercises are stretches and general technique.  Instructed pt. in difference between stretches and strengthening activities along with general rationale of each.  Heavy cueing required for proper performance of supine ITB stretch with strap as pt. initially performing this activity with opposite LE pull putting groin musculature on stretch instead of lateral hip musculature.  Pt. verbalized good understanding of HEP after instruction and updated HEP with standing ball TKE on wall for continue quad/VMO training as this activity was well tolerated and well understood.  Ended visit with pt. pain free thus deferred modalities.    Comorbidities B carpal tunnel release, HTN, lumbar DJD, midfoot OA, L anterior tibialis pain/tendinitis, allergies    Rehab Potential Excellent    PT Frequency 1x / week    PT Duration 8 weeks    PT Treatment/Interventions ADLs/Self Care Home Management;Cryotherapy;Electrical Stimulation;Iontophoresis 4mg /ml Dexamethasone;Moist Heat;Ultrasound;Gait training;Stair training;Functional mobility training;Therapeutic activities;Therapeutic exercise;Balance training;Neuromuscular re-education;Patient/family education;Manual techniques;Passive range of motion;Dry needling;Taping;Vasopneumatic Device;Joint Manipulations    PT Next Visit Plan proximal LE flexibility with manual therapy as indicated to address increased muscle tension/tone; R knee ROM; LE strengthening; taping and/or modalities PRN for pain    PT Home Exercise Plan 9/10 - HS, ITB & piriformis stretches, SAQ + VMO (hip ADD squeeze), bridge +/- hip ADD squeeze, s/l hip ABD, red TB s/l clam    Consulted and Agree with Plan of Care Patient           Patient will benefit from skilled therapeutic intervention in order to improve the following deficits and impairments:  Abnormal gait, Decreased activity  tolerance, Decreased balance, Decreased endurance, Decreased range of motion, Decreased strength, Difficulty walking, Increased edema, Increased muscle spasms, Increased fascial restricitons, Impaired perceived functional ability, Pain  Visit Diagnosis: Acute pain of right knee  Stiffness of right knee, not elsewhere classified  Muscle weakness (generalized)  Difficulty in walking, not elsewhere classified     Problem List Patient Active Problem List   Diagnosis Date Noted  . Patellofemoral arthritis of right knee 11/16/2019  . Acute left lumbar radiculopathy 09/14/2019  . Left tibialis posterior tendinitis 06/29/2019  . Hyperglycemia 10/05/2018  . Hypertension 05/10/2018  . Abnormality of gait 06/30/2016  . Arthritis of  midfoot 06/02/2016  . PCP NOTES >>>> 03/18/2015  . CTS (carpal tunnel syndrome) 08/01/2014  . Eczema, ear canal 05/15/2014  . Eustachian tube dysfunction 04/08/2014  . Annual physical exam 02/19/2014  . DJD (degenerative joint disease) 01/15/2012  . Fatigue 10/31/2010  . Menopause 10/31/2010  . ALLERGIC RHINITIS 01/07/2007  . COMMON MIGRAINE 12/15/2006    Bess Harvest, PTA 12/22/19 10:36 AM   [facility] [address]  [place], [place], [address] Phone: [phone]   Fax:  [phone]  Name: Lori Goodwin MRN: [id number] Date of Birth: 03-28-1946

## 2019-12-28 ENCOUNTER — Other Ambulatory Visit: Payer: Self-pay

## 2019-12-28 ENCOUNTER — Encounter: Payer: Self-pay | Admitting: Physical Therapy

## 2019-12-28 ENCOUNTER — Ambulatory Visit: Payer: Medicare Other | Admitting: Physical Therapy

## 2019-12-28 DIAGNOSIS — M5442 Lumbago with sciatica, left side: Secondary | ICD-10-CM | POA: Diagnosis not present

## 2019-12-28 DIAGNOSIS — M6281 Muscle weakness (generalized): Secondary | ICD-10-CM

## 2019-12-28 DIAGNOSIS — M25661 Stiffness of right knee, not elsewhere classified: Secondary | ICD-10-CM | POA: Diagnosis not present

## 2019-12-28 DIAGNOSIS — M25561 Pain in right knee: Secondary | ICD-10-CM | POA: Diagnosis not present

## 2019-12-28 DIAGNOSIS — R262 Difficulty in walking, not elsewhere classified: Secondary | ICD-10-CM

## 2019-12-28 DIAGNOSIS — G8929 Other chronic pain: Secondary | ICD-10-CM | POA: Diagnosis not present

## 2019-12-28 NOTE — Patient Instructions (Signed)
    Home exercise program created by Christean Silvestri, PT.  For questions, please contact Shaye Lagace via phone at [phone] or email at [email or web address]  [facility] [place] [facility] [address]  [address] [place], [place], [address] Phone: [phone]   Fax:  [phone]    

## 2019-12-28 NOTE — Therapy (Signed)
[facility] [address]  [place], [place], [address] Phone: [phone]   Fax:  [phone]  Physical Therapy Treatment  Patient Details  Name: Lori Goodwin MRN: [id number] Date of Birth: [birth year] Referring Provider (PT): [name], DO   Encounter Date: 12/28/2019   PT End of Session - 12/28/19 0804    Visit Number 4    Number of Visits 8    Date for PT Re-Evaluation 02/01/20    Authorization Type Medicare + BCBS    PT Start Time 0804    PT Stop Time 0849    PT Time Calculation (min) 45 min    Activity Tolerance Patient tolerated treatment well    Behavior During Therapy [facility] for tasks assessed/performed           Past Medical History:  Diagnosis Date  . Allergic rhinitis   . Common migraine   . Hypertension     Past Surgical History:  Procedure Laterality Date  . CARPAL TUNNEL RELEASE Left 01/05/2016  . CARPAL TUNNEL RELEASE Right 04/2018  . TYMPANOSTOMY TUBE PLACEMENT      There were no vitals filed for this visit.   Subjective Assessment - 12/28/19 0809    Subjective Pt reports pain is only intermittent and mild when present. She reports the people she works with have noted she is walking much better w/o a notable limp. She reports she has not been doing bridges because they bother her neck.    Diagnostic tests R knee x-ray 11/15/19 - Mild patellofemoral and medial compartmental osteoarthritis.    Patient Stated Goals "Want to be walking like I used to without pain or need for brace"    Currently in Pain? No/denies    Pain Score 0-No pain   up to 2/10   Pain Location Knee    Pain Orientation Right    Pain Frequency Intermittent    Aggravating Factors  after walking                             [facility] Adult PT Treatment/Exercise - 12/28/19 0804      Knee/Hip Exercises: Aerobic   Nustep L4 x 6 min (UE/LE)      Knee/Hip Exercises: Standing   Heel Raises Both;15  reps;5 seconds    Hip Flexion Right;Left;10 reps;Stengthening;Knee straight    Hip Flexion Limitations red TB; UE support on back of chair for balance    Terminal Knee Extension Right;15 reps;Strengthening;Theraband   5" hold   Theraband Level (Terminal Knee Extension) Level 4 (Blue)    Terminal Knee Extension Limitations cues for quad & glute set, pushing heel into floor    Hip ADduction Right;Left;10 reps;Strengthening    Hip ADduction Limitations red TB; UE support on back of chair for balance    Hip Abduction Right;Left;10 reps;Stengthening;Knee straight    Abduction Limitations red TB; UE support on back of chair for balance    Hip Extension Right;Left;10 reps;Stengthening;Knee straight    Extension Limitations red TB; UE support on back of chair for balance    Functional Squat 5 reps;10 reps;5 seconds    Functional Squat Limitations counter squat; + hip ADD ball squeeze isometric for last 10 reps      Knee/Hip Exercises: Seated   Long Arc Quad Right;15 reps;Strengthening    Long Arc Quad Limitations red TB    Hamstring Curl Right;15 reps;Strengthening    Hamstring  Limitations red TB                  PT Education - 12/28/19 1152    Education Details HEP update - red TB seated HS curl & knee extension, red TB standing 4-way SLR, blue TB TKE, counter squat    Person(s) Educated Patient    Methods Explanation;Demonstration;Verbal cues;Handout    Comprehension Verbalized understanding;Verbal cues required;Returned demonstration;Need further instruction               PT Long Term Goals - 12/15/19 5188      PT LONG TERM GOAL #1   Title Patient will be independent with ongoing/advanced HEP    Status On-going    Target Date 02/01/20      PT LONG TERM GOAL #2   Title Patient to improve R knee AROM equivalent to L without pain provocation    Status On-going    Target Date 02/01/20      PT LONG TERM GOAL #3   Title Patient will demonstrate improved B LE strength to  >/= 4+/5 for improved stability and ease of mobility    Status On-going    Target Date 02/01/20      PT LONG TERM GOAL #4   Title Patient will improve walking tolerance w/o knee brace to >/= 45-60 minutes w/o pain interference to allow resumption of normal daily exercise routine    Status On-going    Target Date 02/01/20                 Plan - 12/28/19 0813    Clinical Impression Statement Lori Goodwin reports pain now only intermittent, typically after walking for a longer period, but notes less of a limp. HEP going well other than she has been avoiding the bridges as they bother her neck, therefore progressed hip strengthening to standing to avoid irritating her neck. Pt reporting good tolerance for HEP progression, therefore updated HEP instructions provided.    Comorbidities B carpal tunnel release, HTN, lumbar DJD, midfoot OA, L anterior tibialis pain/tendinitis, allergies    Rehab Potential Excellent    PT Frequency 1x / week    PT Duration 8 weeks    PT Treatment/Interventions ADLs/Self Care Home Management;Cryotherapy;Electrical Stimulation;Iontophoresis 4mg /ml Dexamethasone;Moist Heat;Ultrasound;Gait training;Stair training;Functional mobility training;Therapeutic activities;Therapeutic exercise;Balance training;Neuromuscular re-education;Patient/family education;Manual techniques;Passive range of motion;Dry needling;Taping;Vasopneumatic Device;Joint Manipulations    PT Next Visit Plan proximal LE flexibility with manual therapy as indicated to address increased muscle tension/tone; R knee ROM; LE strengthening; taping and/or modalities PRN for pain    PT Home Exercise Plan 9/10 - HS, ITB & piriformis stretches, SAQ + VMO (hip ADD squeeze), bridge +/- hip ADD squeeze, s/l hip ABD, red TB s/l clam; 9/23 - red TB seated HS curl & knee extension, red TB standing 4-way SLR, blue TB TKE, counter squat    Consulted and Agree with Plan of Care Patient           Patient will benefit from  skilled therapeutic intervention in order to improve the following deficits and impairments:  Abnormal gait, Decreased activity tolerance, Decreased balance, Decreased endurance, Decreased range of motion, Decreased strength, Difficulty walking, Increased edema, Increased muscle spasms, Increased fascial restricitons, Impaired perceived functional ability, Pain  Visit Diagnosis: Acute pain of right knee  Stiffness of right knee, not elsewhere classified  Muscle weakness (generalized)  Difficulty in walking, not elsewhere classified     Problem List Patient Active Problem List   Diagnosis Date Noted  . Patellofemoral  arthritis of right knee 11/16/2019  . Acute left lumbar radiculopathy 09/14/2019  . Left tibialis posterior tendinitis 06/29/2019  . Hyperglycemia 10/05/2018  . Hypertension 05/10/2018  . Abnormality of gait 06/30/2016  . Arthritis of midfoot 06/02/2016  . PCP NOTES >>>> 03/18/2015  . CTS (carpal tunnel syndrome) 08/01/2014  . Eczema, ear canal 05/15/2014  . Eustachian tube dysfunction 04/08/2014  . Annual physical exam 02/19/2014  . DJD (degenerative joint disease) 01/15/2012  . Fatigue 10/31/2010  . Menopause 10/31/2010  . ALLERGIC RHINITIS 01/07/2007  . COMMON MIGRAINE 12/15/2006    Percival Spanish, PT, MPT 12/28/2019, 12:11 PM  [facility] [address]  [place], [place], [address] Phone: [phone]   Fax:  [phone]  Name: Lori Goodwin MRN: [id number] Date of Birth: Aug 08, 1945

## 2020-01-03 ENCOUNTER — Encounter: Payer: Self-pay | Admitting: Family Medicine

## 2020-01-03 ENCOUNTER — Other Ambulatory Visit: Payer: Self-pay

## 2020-01-03 ENCOUNTER — Ambulatory Visit (INDEPENDENT_AMBULATORY_CARE_PROVIDER_SITE_OTHER): Payer: Medicare Other | Admitting: Family Medicine

## 2020-01-03 DIAGNOSIS — D1720 Benign lipomatous neoplasm of skin and subcutaneous tissue of unspecified limb: Secondary | ICD-10-CM | POA: Insufficient documentation

## 2020-01-03 DIAGNOSIS — D1724 Benign lipomatous neoplasm of skin and subcutaneous tissue of left leg: Secondary | ICD-10-CM

## 2020-01-03 DIAGNOSIS — M1711 Unilateral primary osteoarthritis, right knee: Secondary | ICD-10-CM | POA: Diagnosis not present

## 2020-01-03 NOTE — Assessment & Plan Note (Signed)
Patient is doing significantly better at this time.  I expect patient will do very well.  We will start increasing activity as tolerated.  Patient will finish up with physical therapy in the next couple weeks.  Follow-up with me again in 3 months

## 2020-01-03 NOTE — Patient Instructions (Signed)
Knee looks great Pennsaid Ice after a long day Slowly increase walking Let's watch small lipoma on ankle See me in 2-3 months for a check of ankle

## 2020-01-03 NOTE — Assessment & Plan Note (Signed)
Patient states that he notices a very small lump on the anterior aspect.  Freely movable and tender.  Feels more like a lipoma.  We will monitor and follow-up in 3 months.  If any enlargement consider ultrasound or further imaging.

## 2020-01-03 NOTE — Progress Notes (Signed)
[facility] [place] Phone: [phone] Subjective:   Lori Goodwin, am serving as a scribe for Dr. Hulan Saas. This visit occurred during the SARS-CoV-2 public health emergency.  Safety protocols were in place, including screening questions prior to the visit, additional usage of staff PPE, and extensive cleaning of exam room while observing appropriate contact time as indicated for disinfecting solutions.   I'm seeing this patient by the request  of:  Colon Branch, MD  CC: Right knee pain follow-up  OHY:[id number]   11/15/2019 Patient has been in formal physical therapy but now is having contralateral leg pain.  Seems to be more secondary to the patellofemoral.  Discussed with patient in great length about this.  Discussed bracing, home exercise, icing regimen.  Patient had x-rays again today.  Patient has done very well with formal physical therapy and will consider this as well to add on if necessary.  Follow-up in 6 weeks  Patient seems to be doing remarkably better at this time.  Goodwin significant change in management patient feels like she is feeling much better   Update 01/03/2020 Lori Goodwin is a 74 y.o. female coming in with complaint of right knee and low back pain. Patient states that she has been going to physical therapy for her knee. Does feel pain has improved.  Patient feels physical therapy has been helpful.  Has started walking.  Wants to know if she can start walking without the brace  In general her back pain has also improved.   Has noticed a very small neck mass on the left ankle.  Anterior.  Seems worse after a lot of walking.     Past Medical History:  Diagnosis Date  . Allergic rhinitis   . Common migraine   . Hypertension    Past Surgical History:  Procedure Laterality Date  . CARPAL TUNNEL RELEASE Left 01/05/2016  . CARPAL TUNNEL RELEASE Right 04/2018  . TYMPANOSTOMY TUBE PLACEMENT       Social History   Socioeconomic History  . Marital status: Married    Spouse name: Not on file  . Number of children: 3  . Years of education: Not on file  . Highest education level: Not on file  Occupational History  . Occupation: [name], 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    Employer: [employer]  Tobacco Use  . Smoking status: Never Smoker  . Smokeless tobacco: Never Used  Substance and Sexual Activity  . Alcohol use: Goodwin  . Drug use: Goodwin  . Sexual activity: Not on file  Other Topics Concern  . Not on file  Social History Narrative   [place] from [place]   Divorced, remarried   Lives w/ husband   [name] her son lives in [place]     3 children ,1 son is paraplegic , has another that is a MD,other a PA     Social Determinants of Radio broadcast assistant Strain:   . Difficulty of Paying Living Expenses: Not on file  Food Insecurity:   . Worried About Charity fundraiser in the Last Year: Not on file  . Ran Out of Food in the Last Year: Not on file  Transportation Needs:   . Lack of Transportation (Medical): Not on file  . Lack of Transportation (Non-Medical): Not on file  Physical Activity:   . Days of Exercise per Week: Not on file  . Minutes of Exercise per Session: Not  on file  Stress:   . Feeling of Stress : Not on file  Social Connections:   . Frequency of Communication with Friends and Family: Not on file  . Frequency of Social Gatherings with Friends and Family: Not on file  . Attends Religious Services: Not on file  . Active Member of Clubs or Organizations: Not on file  . Attends Archivist Meetings: Not on file  . Marital Status: Not on file   Allergies  Allergen Reactions  . Tylenol [Acetaminophen] Shortness Of Breath   Family History  Problem Relation Age of Onset  . Colon cancer Neg Hx   . Breast cancer Neg Hx   . Diabetes Neg Hx   . Heart attack Neg Hx      Current Outpatient Medications (Cardiovascular):  .  amLODipine (NORVASC) 5  MG tablet, Take 1 tablet (5 mg total) by mouth daily. .  propranolol ER (INDERAL LA) 60 MG 24 hr capsule, Take 1 capsule (60 mg total) by mouth daily.   Current Outpatient Medications (Analgesics):  [name]  SUMAtriptan (IMITREX) 50 MG tablet, TAKE 1-2 TABLETS BY MOUTH AS NEEDED FOR MIGRAINE. MAY REPEAT IN 2 HOURS IF HEADACHE PERSISTS OR RECURS   Current Outpatient Medications (Other):  [name]  Cholecalciferol (VITAMIN D) 2000 units CAPS, Take 2 capsules by mouth. .  clobetasol (TEMOVATE) 0.05 % external solution, Apply topically daily as needed. .  Multiple Vitamin (MULTIVITAMIN WITH MINERALS) TABS, Take 1 tablet by mouth daily.   Reviewed prior external information including notes and imaging from  primary care provider As well as notes that were available from care everywhere and other healthcare systems.  Past medical history, social, surgical and family history all reviewed in electronic medical record.  Goodwin pertanent information unless stated regarding to the chief complaint.   Review of Systems:  Goodwin headache, visual changes, nausea, vomiting, diarrhea, constipation, dizziness, abdominal pain, skin rash, fevers, chills, night sweats, weight loss, swollen lymph nodes, body aches, joint swelling, chest pain, shortness of breath, mood changes. POSITIVE muscle aches  Objective  Blood pressure 106/72, pulse 62, height 5\' 3"  (1.6 m), weight 163 lb (73.9 kg), SpO2 98 %.   General: Goodwin apparent distress alert and oriented x3 mood and affect normal, dressed appropriately.  HEENT: Pupils equal, extraocular movements intact  Respiratory: Patient's speak in full sentences and does not appear short of breath  Cardiovascular: Goodwin lower extremity edema, non tender, Goodwin erythema  Neuro: Cranial nerves II through XII are intact, neurovascularly intact in all extremities with 2+ DTRs and 2+ pulses.  Gait very mildly antalgic MSK:  Right knee exam does have some mild lateral tracking of patella.  Mild positive  patellar grind test.  Goodwin instability noted of the knee though with valgus or varus force.  Lacks last 5 degrees of flexion.  Contralateral knee unremarkable.  The left leg approximately 3 cm proximal to the ankle on the anterior aspect has what appears to be more lipoma with a freely movable fatty mass.  Tender to palpation in the area.  With patient moving does not seem to be associated with the tendon sheath of the muscle.     Impression and Recommendations:     The above documentation has been reviewed and is accurate and complete [name], DO       Note: This dictation was prepared with Dragon dictation along with smaller phrase technology. Any transcriptional errors that result from this process are unintentional.

## 2020-01-04 ENCOUNTER — Ambulatory Visit: Payer: Medicare Other | Admitting: Physical Therapy

## 2020-01-04 ENCOUNTER — Encounter: Payer: Self-pay | Admitting: Physical Therapy

## 2020-01-04 ENCOUNTER — Other Ambulatory Visit: Payer: Self-pay

## 2020-01-04 DIAGNOSIS — M5442 Lumbago with sciatica, left side: Secondary | ICD-10-CM | POA: Diagnosis not present

## 2020-01-04 DIAGNOSIS — G8929 Other chronic pain: Secondary | ICD-10-CM | POA: Diagnosis not present

## 2020-01-04 DIAGNOSIS — M25661 Stiffness of right knee, not elsewhere classified: Secondary | ICD-10-CM | POA: Diagnosis not present

## 2020-01-04 DIAGNOSIS — M25561 Pain in right knee: Secondary | ICD-10-CM

## 2020-01-04 DIAGNOSIS — R262 Difficulty in walking, not elsewhere classified: Secondary | ICD-10-CM

## 2020-01-04 DIAGNOSIS — M6281 Muscle weakness (generalized): Secondary | ICD-10-CM

## 2020-01-04 NOTE — Therapy (Signed)
[facility] [address]  [place], [place], [address] Phone: [phone]   Fax:  [phone]  Physical Therapy Treatment  Patient Details  Name: Lori Goodwin MRN: [id number] Date of Birth: 1946-02-16 Referring Provider (PT): [name], DO   Encounter Date: 01/04/2020   PT End of Session - 01/04/20 0805    Visit Number 5    Number of Visits 8    Date for PT Re-Evaluation 02/01/20    Authorization Type Medicare + BCBS    PT Start Time 0805    PT Stop Time 0844    PT Time Calculation (min) 39 min    Activity Tolerance Patient tolerated treatment well    Behavior During Therapy [facility] for tasks assessed/performed           Past Medical History:  Diagnosis Date   Allergic rhinitis    Common migraine    Hypertension     Past Surgical History:  Procedure Laterality Date   CARPAL TUNNEL RELEASE Left 01/05/2016   CARPAL TUNNEL RELEASE Right 04/2018   TYMPANOSTOMY TUBE PLACEMENT      There were no vitals filed for this visit.   Subjective Assessment - 01/04/20 0809    Subjective Pt reports she saw Dr. Tamala Julian yesterday and states he is pleased with her progress. MD told her she could start trying to walk w/o her brace on level ground.    Diagnostic tests R knee x-ray 11/15/19 - Mild patellofemoral and medial compartmental osteoarthritis.    Patient Stated Goals "Want to be walking like I used to without pain or need for brace"    Currently in Pain? No/denies    Pain Score --   <2/10 when present   Pain Location Knee    Pain Orientation Right    Pain Type Acute pain                             [facility] Adult PT Treatment/Exercise - 01/04/20 0805      Exercises   Exercises Knee/Hip      Knee/Hip Exercises: Aerobic   Nustep L5 x 6 min (UE/LE)      Knee/Hip Exercises: Standing   Hip Flexion Right;Left;10 reps;Stengthening;Knee straight    Hip Flexion Limitations red TB; UE  support on back of chair for balance    Terminal Knee Extension Right;15 reps;Strengthening;Theraband   5" hold   Theraband Level (Terminal Knee Extension) Level 4 (Blue)    Hip ADduction Right;Left;10 reps;Strengthening    Hip ADduction Limitations red TB; UE support on back of chair for balance    Hip Abduction Right;Left;10 reps;Stengthening;Knee straight    Abduction Limitations red TB; UE support on back of chair for balance    Hip Extension Right;Left;10 reps;Stengthening;Knee straight    Extension Limitations red TB; UE support on back of chair for balance      Knee/Hip Exercises: Seated   Hamstring Curl Right;15 reps;Strengthening    Hamstring Limitations red TB                       PT Long Term Goals - 12/15/19 2355      PT LONG TERM GOAL #1   Title Patient will be independent with ongoing/advanced HEP    Status On-going    Target Date 02/01/20      PT LONG TERM GOAL #2   Title Patient to  improve R knee AROM equivalent to L without pain provocation    Status On-going    Target Date 02/01/20      PT LONG TERM GOAL #3   Title Patient will demonstrate improved B LE strength to >/= 4+/5 for improved stability and ease of mobility    Status On-going    Target Date 02/01/20      PT LONG TERM GOAL #4   Title Patient will improve walking tolerance w/o knee brace to >/= 45-60 minutes w/o pain interference to allow resumption of normal daily exercise routine    Status On-going    Target Date 02/01/20                 Plan - 01/04/20 0811    Clinical Impression Statement [name] reports Dr. Tamala Julian was pleased with her progress and has cleared her to start walking w/o her knee brace on level ground. Recommended pt start with shorter walk for first time w/o brace and then gradually increase distance in subsequent walks pending how her knee feels with walking w/o the brace. Pt requested review of the standing 4-way SLR with red TB as she is not sure if she was  doing these correctly at home - better comfort/understanding reported after review of these as well as HS curl and TKE with notes added to HEP instructions for clarification.    Comorbidities B carpal tunnel release, HTN, lumbar DJD, midfoot OA, L anterior tibialis pain/tendinitis, allergies    Rehab Potential Excellent    PT Frequency 1x / week    PT Duration 8 weeks    PT Treatment/Interventions ADLs/Self Care Home Management;Cryotherapy;Electrical Stimulation;Iontophoresis 4mg /ml Dexamethasone;Moist Heat;Ultrasound;Gait training;Stair training;Functional mobility training;Therapeutic activities;Therapeutic exercise;Balance training;Neuromuscular re-education;Patient/family education;Manual techniques;Passive range of motion;Dry needling;Taping;Vasopneumatic Device;Joint Manipulations    PT Next Visit Plan proximal LE flexibility with manual therapy as indicated to address increased muscle tension/tone; R knee ROM; LE strengthening; taping and/or modalities PRN for pain    PT Home Exercise Plan 9/10 - HS, ITB & piriformis stretches, SAQ + VMO (hip ADD squeeze), bridge +/- hip ADD squeeze, s/l hip ABD, red TB s/l clam; 9/23 - red TB seated HS curl & knee extension, red TB standing 4-way SLR, blue TB TKE, counter squat    Consulted and Agree with Plan of Care Patient           Patient will benefit from skilled therapeutic intervention in order to improve the following deficits and impairments:  Abnormal gait, Decreased activity tolerance, Decreased balance, Decreased endurance, Decreased range of motion, Decreased strength, Difficulty walking, Increased edema, Increased muscle spasms, Increased fascial restricitons, Impaired perceived functional ability, Pain  Visit Diagnosis: Acute pain of right knee  Stiffness of right knee, not elsewhere classified  Muscle weakness (generalized)  Difficulty in walking, not elsewhere classified  Chronic bilateral low back pain with left-sided  sciatica     Problem List Patient Active Problem List   Diagnosis Date Noted   Lipoma of lower extremity 01/03/2020   Patellofemoral arthritis of right knee 11/16/2019   Acute left lumbar radiculopathy 09/14/2019   Left tibialis posterior tendinitis 06/29/2019   Hyperglycemia 10/05/2018   Hypertension 05/10/2018   Abnormality of gait 06/30/2016   Arthritis of midfoot 06/02/2016   PCP NOTES >>>> 03/18/2015   CTS (carpal tunnel syndrome) 08/01/2014   Eczema, ear canal 05/15/2014   Eustachian tube dysfunction 04/08/2014   Annual physical exam 02/19/2014   DJD (degenerative joint disease) 01/15/2012   Fatigue 10/31/2010   Menopause  10/31/2010   ALLERGIC RHINITIS 01/07/2007   COMMON MIGRAINE 12/15/2006    Percival Spanish, PT, MPT  01/04/2020, 9:29 AM  [facility] [address]  [place], [place], [address] Phone: [phone]   Fax:  [phone]  Name: Lori Goodwin MRN: [id number] Date of Birth: 12/29/1945

## 2020-01-11 ENCOUNTER — Other Ambulatory Visit: Payer: Self-pay

## 2020-01-11 ENCOUNTER — Ambulatory Visit: Payer: Medicare Other | Attending: Family Medicine | Admitting: Physical Therapy

## 2020-01-11 ENCOUNTER — Encounter: Payer: Self-pay | Admitting: Physical Therapy

## 2020-01-11 DIAGNOSIS — R262 Difficulty in walking, not elsewhere classified: Secondary | ICD-10-CM | POA: Diagnosis not present

## 2020-01-11 DIAGNOSIS — M25661 Stiffness of right knee, not elsewhere classified: Secondary | ICD-10-CM

## 2020-01-11 DIAGNOSIS — M6281 Muscle weakness (generalized): Secondary | ICD-10-CM | POA: Diagnosis not present

## 2020-01-11 DIAGNOSIS — G8929 Other chronic pain: Secondary | ICD-10-CM

## 2020-01-11 DIAGNOSIS — M25561 Pain in right knee: Secondary | ICD-10-CM | POA: Diagnosis not present

## 2020-01-11 DIAGNOSIS — M5442 Lumbago with sciatica, left side: Secondary | ICD-10-CM | POA: Diagnosis not present

## 2020-01-11 NOTE — Therapy (Signed)
[facility] [address]  [place], [place], [address] Phone: [phone]   Fax:  [phone]  Physical Therapy Treatment  Patient Details  Name: Lori Goodwin MRN: [id number] Date of Birth: 05-17-1945 Referring Provider (PT): [name], DO   Encounter Date: 01/11/2020   PT End of Session - 01/11/20 0805    Visit Number 6    Number of Visits 8    Date for PT Re-Evaluation 02/01/20    Authorization Type Medicare + BCBS    PT Start Time 0805    PT Stop Time 0845    PT Time Calculation (min) 40 min    Activity Tolerance Patient tolerated treatment well    Behavior During Therapy [facility] for tasks assessed/performed           Past Medical History:  Diagnosis Date  . Allergic rhinitis   . Common migraine   . Hypertension     Past Surgical History:  Procedure Laterality Date  . CARPAL TUNNEL RELEASE Left 01/05/2016  . CARPAL TUNNEL RELEASE Right 04/2018  . TYMPANOSTOMY TUBE PLACEMENT      There were no vitals filed for this visit.   Subjective Assessment - 01/11/20 0807    Subjective Pt reports her pain has been much better - only mild discomfort at time. Has not tried walking w/o the brace yet.    Diagnostic tests R knee x-ray 11/15/19 - Mild patellofemoral and medial compartmental osteoarthritis.    Patient Stated Goals "Want to be walking like I used to without pain or need for brace"    Currently in Pain? No/denies              [facility] PT Assessment - 01/11/20 0805      Assessment   Medical Diagnosis R knee patellofemoral arthritis    Referring Provider (PT) [name], DO    Next MD Visit 03/06/20                         [facility] Adult PT Treatment/Exercise - 01/11/20 0805      Exercises   Exercises Knee/Hip      Knee/Hip Exercises: Aerobic   Nustep L5 x 6 min (UE/LE)      Knee/Hip Exercises: Standing   Hip Flexion Right;10 reps;Stengthening;Knee straight    Hip  Flexion Limitations red TB; UE support on back of chair for balance    Hip ADduction Right;10 reps;Strengthening    Hip ADduction Limitations red TB; UE support on back of chair for balance    Hip Abduction Right;10 reps;Stengthening;Knee straight    Abduction Limitations red TB; UE support on back of chair for balance    Hip Extension Right;10 reps;Stengthening;Knee straight    Extension Limitations red TB; UE support on back of chair for balance    Forward Step Up Right;10 reps;Step Height: 8";Hand Hold: 0    Step Down Right;10 reps;Step Height: 6";Hand Hold: 2    Step Down Limitations eccentric lowering with light touch to floor      Knee/Hip Exercises: Seated   Long Arc Quad Right;15 reps;Strengthening    Long Arc Quad Limitations red TB    Hamstring Curl Right;15 reps;Strengthening    Hamstring Limitations red TB                       PT Long Term Goals - 12/15/19 9379      PT  LONG TERM GOAL #1   Title Patient will be independent with ongoing/advanced HEP    Status On-going    Target Date 02/01/20      PT LONG TERM GOAL #2   Title Patient to improve R knee AROM equivalent to L without pain provocation    Status On-going    Target Date 02/01/20      PT LONG TERM GOAL #3   Title Patient will demonstrate improved B LE strength to >/= 4+/5 for improved stability and ease of mobility    Status On-going    Target Date 02/01/20      PT LONG TERM GOAL #4   Title Patient will improve walking tolerance w/o knee brace to >/= 45-60 minutes w/o pain interference to allow resumption of normal daily exercise routine    Status On-going    Target Date 02/01/20                 Plan - 01/11/20 3790    Clinical Impression Statement Lori Goodwin continues to report reduction in pain but has yet to try walking w/o her knee brace as cleared by MD last week. Pt requesting another review of the standing 4-way SLR today but able to perform good return demonstration with only  slight cues necessary to confirm proper technique. Pt reports red TB continues to give her a good challenge and denies need for progression of resistance level. Seated HS curls and LAQ with red TB also reviewed, clarifying chair placement and position for pause/hold against theraband resistance. Pt adding additional handwritten notes to HEP handout for self-clarification at home.  Also provided green TB to allow for progression of resistance as pt feels ready at home. Introduced step-ups and step-downs to promote increased comfort with weight shift to R as well as improved control with stair negotiation. Pt progressing well with PT and has 2 visits remaining in POC and anticipate that she should be ready to transition to HEP at that time.    Comorbidities B carpal tunnel release, HTN, lumbar DJD, midfoot OA, L anterior tibialis pain/tendinitis, allergies    Rehab Potential Excellent    PT Frequency 1x / week    PT Duration 8 weeks    PT Treatment/Interventions ADLs/Self Care Home Management;Cryotherapy;Electrical Stimulation;Iontophoresis 4mg /ml Dexamethasone;Moist Heat;Ultrasound;Gait training;Stair training;Functional mobility training;Therapeutic activities;Therapeutic exercise;Balance training;Neuromuscular re-education;Patient/family education;Manual techniques;Passive range of motion;Dry needling;Taping;Vasopneumatic Device;Joint Manipulations    PT Next Visit Plan proximal LE flexibility with manual therapy as indicated to address increased muscle tension/tone; R knee ROM; LE strengthening; taping and/or modalities PRN for pain    PT Home Exercise Plan 9/10 - HS, ITB & piriformis stretches, SAQ + VMO (hip ADD squeeze), bridge +/- hip ADD squeeze, s/l hip ABD, red TB s/l clam; 9/23 - red TB seated HS curl & knee extension, red TB standing 4-way SLR, blue TB TKE, counter squat    Consulted and Agree with Plan of Care Patient           Patient will benefit from skilled therapeutic intervention in  order to improve the following deficits and impairments:  Abnormal gait, Decreased activity tolerance, Decreased balance, Decreased endurance, Decreased range of motion, Decreased strength, Difficulty walking, Increased edema, Increased muscle spasms, Increased fascial restricitons, Impaired perceived functional ability, Pain  Visit Diagnosis: Acute pain of right knee  Stiffness of right knee, not elsewhere classified  Muscle weakness (generalized)  Difficulty in walking, not elsewhere classified  Chronic bilateral low back pain with left-sided sciatica  Problem List Patient Active Problem List   Diagnosis Date Noted  . Lipoma of lower extremity 01/03/2020  . Patellofemoral arthritis of right knee 11/16/2019  . Acute left lumbar radiculopathy 09/14/2019  . Left tibialis posterior tendinitis 06/29/2019  . Hyperglycemia 10/05/2018  . Hypertension 05/10/2018  . Abnormality of gait 06/30/2016  . Arthritis of midfoot 06/02/2016  . PCP NOTES >>>> 03/18/2015  . CTS (carpal tunnel syndrome) 08/01/2014  . Eczema, ear canal 05/15/2014  . Eustachian tube dysfunction 04/08/2014  . Annual physical exam 02/19/2014  . DJD (degenerative joint disease) 01/15/2012  . Fatigue 10/31/2010  . Menopause 10/31/2010  . ALLERGIC RHINITIS 01/07/2007  . COMMON MIGRAINE 12/15/2006    Percival Spanish, PT, MPT 01/11/2020, 9:29 AM  [facility] [address]  [place], [place], [address] Phone: [phone]   Fax:  [phone]  Name: Lori Goodwin MRN: [id number] Date of Birth: 18-Apr-1945

## 2020-01-18 ENCOUNTER — Encounter: Payer: Self-pay | Admitting: Physical Therapy

## 2020-01-18 ENCOUNTER — Other Ambulatory Visit: Payer: Self-pay

## 2020-01-18 ENCOUNTER — Ambulatory Visit: Payer: Medicare Other | Admitting: Physical Therapy

## 2020-01-18 DIAGNOSIS — M6281 Muscle weakness (generalized): Secondary | ICD-10-CM

## 2020-01-18 DIAGNOSIS — R262 Difficulty in walking, not elsewhere classified: Secondary | ICD-10-CM

## 2020-01-18 DIAGNOSIS — M25661 Stiffness of right knee, not elsewhere classified: Secondary | ICD-10-CM

## 2020-01-18 DIAGNOSIS — M25561 Pain in right knee: Secondary | ICD-10-CM

## 2020-01-18 DIAGNOSIS — M5442 Lumbago with sciatica, left side: Secondary | ICD-10-CM | POA: Diagnosis not present

## 2020-01-18 DIAGNOSIS — G8929 Other chronic pain: Secondary | ICD-10-CM | POA: Diagnosis not present

## 2020-01-18 NOTE — Therapy (Signed)
[facility] [address]  [place], [place], [address] Phone: [phone]   Fax:  [phone]  Physical Therapy Treatment  Patient Details  Name: Lori Goodwin MRN: [id number] Date of Birth: June 23, 1945 Referring Provider (PT): [name], DO   Encounter Date: 01/18/2020   PT End of Session - 01/18/20 0808    Visit Number 7    Number of Visits 8    Date for PT Re-Evaluation 02/01/20    Authorization Type Medicare + BCBS    PT Start Time 0805    PT Stop Time 0853    PT Time Calculation (min) 48 min    Activity Tolerance Patient tolerated treatment well    Behavior During Therapy [facility] for tasks assessed/performed           Past Medical History:  Diagnosis Date  . Allergic rhinitis   . Common migraine   . Hypertension     Past Surgical History:  Procedure Laterality Date  . CARPAL TUNNEL RELEASE Left 01/05/2016  . CARPAL TUNNEL RELEASE Right 04/2018  . TYMPANOSTOMY TUBE PLACEMENT      There were no vitals filed for this visit.   Subjective Assessment - 01/18/20 0807    Subjective Pt reports she was able to go shopping w/o her brace w/o issue but does not feel confident trying to go for a walk w/o the brace. Still nores discomfort in L anterior shin by end of day - thinks she may still be favoring the R leg and putting more weight to the L    Diagnostic tests R knee x-ray 11/15/19 - Mild patellofemoral and medial compartmental osteoarthritis.    Patient Stated Goals "Want to be walking like I used to without pain or need for brace"    Currently in Pain? No/denies              [facility] PT Assessment - 01/18/20 0805      AROM   Right Knee Flexion 126                         [facility] Adult PT Treatment/Exercise - 01/18/20 0805      Exercises   Exercises Knee/Hip      Knee/Hip Exercises: Stretches   ITB Stretch Right;Left;30 seconds;2 reps   each   ITB Stretch Limitations  supine cross body with strap & standing lateral lean at wall    Piriformis Stretch Right;Left;30 seconds;2 reps   each   Piriformis Stretch Limitations supine & seated KTOS with ankle resting on opp knee    Other Knee/Hip Stretches L anterior tibialis stretch 2 x 30 sec      Knee/Hip Exercises: Aerobic   Nustep L5 x 6 min (UE/LE)      Knee/Hip Exercises: Standing   Terminal Knee Extension Right;15 reps;Strengthening;Theraband   5" hold   Theraband Level (Terminal Knee Extension) Other (comment)   black TB   Terminal Knee Extension Limitations cues to keep foot stationary rocking fwd onto toes then activating quad & glutes, pushing heel into floor      Manual Therapy   Manual Therapy Soft tissue mobilization;Myofascial release;Other (comment)    Soft tissue mobilization STM/DTM to L anterior tibialis    Myofascial Release manull TPR to mid anterior tibialis    Other Manual Therapy Instructed pt in use of rolling pin for self-STM cautioning pt to avoid rolling directly over anterior border of tibia  PT Education - 01/18/20 0845    Education Details HEP review, clarification and progression + update: anterior tibialis stretches    Person(s) Educated Patient    Methods Explanation;Demonstration;Verbal cues;Tactile cues;Handout    Comprehension Verbalized understanding;Verbal cues required;Tactile cues required;Returned demonstration;Need further instruction               PT Long Term Goals - 01/18/20 0815      PT LONG TERM GOAL #1   Title Patient will be independent with ongoing/advanced HEP    Status Partially Met    Target Date 02/01/20      PT LONG TERM GOAL #2   Title Patient to improve R knee AROM equivalent to L without pain provocation    Status Partially Met    Target Date 02/01/20      PT LONG TERM GOAL #3   Title Patient will demonstrate improved B LE strength to >/= 4+/5 for improved stability and ease of mobility    Status On-going     Target Date 02/01/20      PT LONG TERM GOAL #4   Title Patient will improve walking tolerance w/o knee brace to >/= 45-60 minutes w/o pain interference to allow resumption of normal daily exercise routine    Status Partially Met    Target Date 02/01/20                 Plan - 01/18/20 0853    Clinical Impression Statement Lori Goodwin reports she feels more confident with the latest HEP update after the review last visit. She notes she was able to go shopping w/o her knee brace but has not attempting her normal 30-minute walk for exercise w/o the brace - encouraged pt to try at least 1 shorter walk (~20 min) before next visit as well as 1 day of work w/o the brace (not same day as the walk) prior to next visit to see how she tolerates weaning from the brace. In hopes that she will feel ready to transition to the HEP as planned next visit, majority of today's visit focused on review, clarification and progression of HEP including addition of anterior tibialis stretches and self-STM for L shin as pt notes some discomfort in anterior shin by the end of the day some days.    Comorbidities B carpal tunnel release, HTN, lumbar DJD, midfoot OA, L anterior tibialis pain/tendinitis, allergies    Rehab Potential Excellent    PT Frequency 1x / week    PT Duration 8 weeks    PT Treatment/Interventions ADLs/Self Care Home Management;Cryotherapy;Electrical Stimulation;Iontophoresis 17m/ml Dexamethasone;Moist Heat;Ultrasound;Gait training;Stair training;Functional mobility training;Therapeutic activities;Therapeutic exercise;Balance training;Neuromuscular re-education;Patient/family education;Manual techniques;Passive range of motion;Dry needling;Taping;Vasopneumatic Device;Joint Manipulations    PT Next Visit Plan goal assessment with hopeful transition to HEP + 30-day hold    PT Home Exercise Plan 9/10 - HS, ITB & piriformis stretches, SAQ + VMO (hip ADD squeeze), bridge +/- hip ADD squeeze, s/l hip ABD, red TB  s/l clam; 9/23 - red TB seated HS curl & knee extension, red TB standing 4-way SLR, blue TB TKE, counter squat    Consulted and Agree with Plan of Care Patient           Patient will benefit from skilled therapeutic intervention in order to improve the following deficits and impairments:  Abnormal gait, Decreased activity tolerance, Decreased balance, Decreased endurance, Decreased range of motion, Decreased strength, Difficulty walking, Increased edema, Increased muscle spasms, Increased fascial restricitons, Impaired perceived functional ability, Pain  Visit  Diagnosis: Acute pain of right knee  Stiffness of right knee, not elsewhere classified  Muscle weakness (generalized)  Difficulty in walking, not elsewhere classified     Problem List Patient Active Problem List   Diagnosis Date Noted  . Lipoma of lower extremity 01/03/2020  . Patellofemoral arthritis of right knee 11/16/2019  . Acute left lumbar radiculopathy 09/14/2019  . Left tibialis posterior tendinitis 06/29/2019  . Hyperglycemia 10/05/2018  . Hypertension 05/10/2018  . Abnormality of gait 06/30/2016  . Arthritis of midfoot 06/02/2016  . PCP NOTES >>>> 03/18/2015  . CTS (carpal tunnel syndrome) 08/01/2014  . Eczema, ear canal 05/15/2014  . Eustachian tube dysfunction 04/08/2014  . Annual physical exam 02/19/2014  . DJD (degenerative joint disease) 01/15/2012  . Fatigue 10/31/2010  . Menopause 10/31/2010  . ALLERGIC RHINITIS 01/07/2007  . COMMON MIGRAINE 12/15/2006    Percival Spanish, PT, MPT 01/18/2020, 9:23 AM  [facility] [address]  [employer] [place], [place], [address] Phone: [phone]   Fax:  [phone]  Name: Lori Goodwin MRN: [id number] Date of Birth: 1945-11-12

## 2020-01-18 NOTE — Patient Instructions (Signed)
    Home exercise program created by Leinani Lisbon, PT.  For questions, please contact Harlan Vinal via phone at [phone] or email at [email or web address]  [facility] [place] [address]  [address] [place], , [address] Phone: [phone]   Fax:  [phone]    

## 2020-01-25 ENCOUNTER — Encounter: Payer: Self-pay | Admitting: Physical Therapy

## 2020-01-25 ENCOUNTER — Other Ambulatory Visit: Payer: Self-pay

## 2020-01-25 ENCOUNTER — Ambulatory Visit: Payer: Medicare Other | Admitting: Physical Therapy

## 2020-01-25 DIAGNOSIS — M6281 Muscle weakness (generalized): Secondary | ICD-10-CM

## 2020-01-25 DIAGNOSIS — M25661 Stiffness of right knee, not elsewhere classified: Secondary | ICD-10-CM

## 2020-01-25 DIAGNOSIS — M5442 Lumbago with sciatica, left side: Secondary | ICD-10-CM | POA: Diagnosis not present

## 2020-01-25 DIAGNOSIS — R262 Difficulty in walking, not elsewhere classified: Secondary | ICD-10-CM | POA: Diagnosis not present

## 2020-01-25 DIAGNOSIS — G8929 Other chronic pain: Secondary | ICD-10-CM | POA: Diagnosis not present

## 2020-01-25 DIAGNOSIS — M25561 Pain in right knee: Secondary | ICD-10-CM | POA: Diagnosis not present

## 2020-01-25 NOTE — Therapy (Addendum)
[facility] [place] [address]  [place], [place], [address] Phone: [phone]   Fax:  [phone]  Physical Therapy Treatment / Progress Note / Discharge Summary  Patient Details  Name: Lori Goodwin MRN: [id number] Date of Birth: 1945-12-29 Referring Provider (PT): [name], DO   Progress Note  Reporting Period 12/14/2019 to 01/25/2020  See note below for Objective Data and Assessment of Progress/Goals.      Encounter Date: 01/25/2020   PT End of Session - 01/25/20 0804    Visit Number 8    Number of Visits 8    Date for PT Re-Evaluation 02/01/20    Authorization Type Medicare + BCBS    PT Start Time 0804    PT Stop Time 0845    PT Time Calculation (min) 41 min    Activity Tolerance Patient tolerated treatment well    Behavior During Therapy [facility] for tasks assessed/performed           Past Medical History:  Diagnosis Date  . Allergic rhinitis   . Common migraine   . Hypertension     Past Surgical History:  Procedure Laterality Date  . CARPAL TUNNEL RELEASE Left 01/05/2016  . CARPAL TUNNEL RELEASE Right 04/2018  . TYMPANOSTOMY TUBE PLACEMENT      There were no vitals filed for this visit.   Subjective Assessment - 01/25/20 0808    Subjective Pt reports she was able to work a full day w/o her brace w/o any issues but noted a little soreness after going for a walk w/o the brace - able to relieve the soreness with an ice pack. Denies "pain" while walking but notes "aware of a sensation". Pt also notes some increased discomfort after walking on an uneven footpath but pain gone by the next morning.    Diagnostic tests R knee x-ray 11/15/19 - Mild patellofemoral and medial compartmental osteoarthritis.    Patient Stated Goals "Want to be walking like I used to without pain or need for brace"    Currently in Pain? No/denies              [facility] PT Assessment - 01/25/20 0804      Assessment    Medical Diagnosis R knee patellofemoral arthritis    Referring Provider (PT) [name], DO    Next MD Visit 03/06/20      Observation/Other Assessments   Focus on Therapeutic Outcomes (FOTO)  Knee - 65% (35% limitation)      AROM   Right Knee Extension -3    Right Knee Flexion 129      Strength   Right Hip Flexion 4+/5    Right Hip Extension 4/5    Right Hip External Rotation  4/5    Right Hip Internal Rotation 4+/5    Right Hip ABduction 4/5    Right Hip ADduction 4+/5    Left Hip Flexion 4+/5    Left Hip Extension 4+/5    Left Hip External Rotation 4+/5    Left Hip Internal Rotation 5/5    Left Hip ABduction 4/5    Left Hip ADduction 4+/5    Right Knee Flexion 4+/5    Right Knee Extension 5/5    Left Knee Flexion 5/5    Left Knee Extension 5/5    Right Ankle Dorsiflexion 4+/5    Right Ankle Plantar Flexion 5/5    Left Ankle Dorsiflexion 4+/5    Left Ankle Plantar Flexion  5/5                         [facility] Adult PT Treatment/Exercise - 01/25/20 0804      Knee/Hip Exercises: Aerobic   Tread Mill 2.0 mph x 6'                       PT Long Term Goals - 01/25/20 3825      PT LONG TERM GOAL #1   Title Patient will be independent with ongoing/advanced HEP    Status Achieved   01/25/20     PT LONG TERM GOAL #2   Title Patient to improve R knee AROM equivalent to L without pain provocation    Status Achieved   01/25/20     PT LONG TERM GOAL #3   Title Patient will demonstrate improved B LE strength to >/= 4+/5 for improved stability and ease of mobility    Status Partially Met      PT LONG TERM GOAL #4   Title Patient will improve walking tolerance w/o knee brace to >/= 45-60 minutes w/o pain interference to allow resumption of normal daily exercise routine    Status Partially Met   01/25/20 - 20 minutes walking w/o brace, 40+ minutes walking with brace                Plan - 01/25/20 0814    Clinical Impression Statement  Lori Goodwin reports good tolerance for working w/o knee brace but did note some increased soreness in her R knee when attempting to walk w/o brace (LTG #4 partially met). Overall she feels that her knee has improved with PT and would like to see how she can manage on her own with the HEP. Her R knee AROM is now essentially equivalent to her L knee (LTG #2 met). B LE strength now >/= 4/5 to 5/5 but slight proximal weakness persisting, more evident on R than L (LTG# 3 partially met). She denies need for further HEP review noting good confidence with exercise following review last session (LTG #1 met), therefore will proceed with transition to HEP with a 30-day hold per pt request in the event that she feels a need to return to PT if her knee does not continue to improve.    Comorbidities B carpal tunnel release, HTN, lumbar DJD, midfoot OA, L anterior tibialis pain/tendinitis, allergies    Rehab Potential Excellent    PT Treatment/Interventions ADLs/Self Care Home Management;Cryotherapy;Electrical Stimulation;Iontophoresis 28m/ml Dexamethasone;Moist Heat;Ultrasound;Gait training;Stair training;Functional mobility training;Therapeutic activities;Therapeutic exercise;Balance training;Neuromuscular re-education;Patient/family education;Manual techniques;Passive range of motion;Dry needling;Taping;Vasopneumatic Device;Joint Manipulations    PT Next Visit Plan transition to HEP + 30-day hold    PT Home Exercise Plan 9/10 - HS, ITB & piriformis stretches, SAQ + VMO (hip ADD squeeze), bridge +/- hip ADD squeeze, s/l hip ABD, red TB s/l clam; 9/23 - red TB seated HS curl & knee extension, red TB standing 4-way SLR, blue TB TKE, counter squat    Consulted and Agree with Plan of Care Patient           Patient will benefit from skilled therapeutic intervention in order to improve the following deficits and impairments:  Abnormal gait, Decreased activity tolerance, Decreased balance, Decreased endurance, Decreased range  of motion, Decreased strength, Difficulty walking, Increased edema, Increased muscle spasms, Increased fascial restricitons, Impaired perceived functional ability, Pain  Visit Diagnosis: Acute pain of right knee  Stiffness of right knee, not  elsewhere classified  Muscle weakness (generalized)  Difficulty in walking, not elsewhere classified     Problem List Patient Active Problem List   Diagnosis Date Noted  . Lipoma of lower extremity 01/03/2020  . Patellofemoral arthritis of right knee 11/16/2019  . Acute left lumbar radiculopathy 09/14/2019  . Left tibialis posterior tendinitis 06/29/2019  . Hyperglycemia 10/05/2018  . Hypertension 05/10/2018  . Abnormality of gait 06/30/2016  . Arthritis of midfoot 06/02/2016  . PCP NOTES >>>> 03/18/2015  . CTS (carpal tunnel syndrome) 08/01/2014  . Eczema, ear canal 05/15/2014  . Eustachian tube dysfunction 04/08/2014  . Annual physical exam 02/19/2014  . DJD (degenerative joint disease) 01/15/2012  . Fatigue 10/31/2010  . Menopause 10/31/2010  . ALLERGIC RHINITIS 01/07/2007  . COMMON MIGRAINE 12/15/2006    Percival Spanish, PT, MPT 01/25/2020, 12:21 PM  [facility] [address]  [place], [place], [address] Phone: [phone]   Fax:  [phone]  Name: Lori Goodwin MRN: [id number] Date of Birth: 1946-03-05   PHYSICAL THERAPY DISCHARGE SUMMARY  Visits from Start of Care: 8  Current functional level related to goals / functional outcomes:   Refer to above clinical impression for status as of last visit on 01/25/2020. Patient was placed on hold for 30 days and has not needed to return to PT, therefore will proceed with discharge from PT for this episode.   Remaining deficits:   As above.    Education / Equipment:   HEP  Plan: Patient agrees to discharge.  Patient goals were partially met. Patient is being discharged due to being pleased with the current  functional level.  ?????     Percival Spanish, PT, MPT 03/08/20, 9:55 AM  [facility] [address]  [place], [place], [address] Phone: [phone]   Fax:  [phone]

## 2020-01-31 ENCOUNTER — Other Ambulatory Visit: Payer: Self-pay | Admitting: Internal Medicine

## 2020-02-01 ENCOUNTER — Ambulatory Visit: Payer: Medicare Other | Admitting: Physical Therapy

## 2020-02-02 ENCOUNTER — Other Ambulatory Visit: Payer: Self-pay

## 2020-02-02 ENCOUNTER — Ambulatory Visit (INDEPENDENT_AMBULATORY_CARE_PROVIDER_SITE_OTHER): Payer: Medicare Other | Admitting: Internal Medicine

## 2020-02-02 ENCOUNTER — Encounter: Payer: Self-pay | Admitting: Internal Medicine

## 2020-02-02 VITALS — BP 160/80 | HR 49 | Temp 98.1°F | Resp 16 | Ht 63.0 in | Wt 160.5 lb

## 2020-02-02 DIAGNOSIS — R739 Hyperglycemia, unspecified: Secondary | ICD-10-CM

## 2020-02-02 DIAGNOSIS — Z23 Encounter for immunization: Secondary | ICD-10-CM | POA: Diagnosis not present

## 2020-02-02 DIAGNOSIS — I10 Essential (primary) hypertension: Secondary | ICD-10-CM

## 2020-02-02 DIAGNOSIS — M25511 Pain in right shoulder: Secondary | ICD-10-CM | POA: Diagnosis not present

## 2020-02-02 NOTE — Progress Notes (Signed)
Pre visit review using our clinic review tool, if applicable. No additional management support is needed unless otherwise documented below in the visit note. 

## 2020-02-02 NOTE — Patient Instructions (Addendum)
IBUPROFEN (Advil or Motrin) 200 mg 1 or  2 tablets every 8 hours as needed for pain.  Always take it with food because may cause gastritis and ulcers.  If you notice nausea, stomach pain, change in the color of stools --->  Stop the medicine and let us know    Consider take ATORVASTATIN   Check the  blood pressure 2  times a month   BP GOAL is between 110/65 and  135/85. If it is consistently higher or lower, let me know     GO TO THE FRONT DESK, PLEASE SCHEDULE YOUR APPOINTMENTS Come back for check up in 6 months

## 2020-02-02 NOTE — Progress Notes (Signed)
Subjective:    Patient ID: Lori Goodwin, female    DOB: 17-Apr-1945, 74 y.o.   MRN: [id number]  DOS:  02/02/2020 Type of visit - description: Follow-up  1 week history of pain located in the right deltoid area, the pain is worse when he pulls with the right arm or lift something. Reports no neck pain or shoulder injury. Ambulatory BPs are very good. Was seen with knee pain: Resolved.  Reports no other concerns, good med compliance    Review of Systems See above   Past Medical History:  Diagnosis Date   Allergic rhinitis    Common migraine    Hypertension     Past Surgical History:  Procedure Laterality Date   CARPAL TUNNEL RELEASE Left 01/05/2016   CARPAL TUNNEL RELEASE Right 04/2018   TYMPANOSTOMY TUBE PLACEMENT      Allergies as of 02/02/2020      Reactions   Tylenol [acetaminophen] Shortness Of Breath      Medication List       Accurate as of February 02, 2020 11:59 PM. If you have any questions, ask your nurse or doctor.        amLODipine 5 MG tablet Commonly known as: NORVASC Take 1 tablet (5 mg total) by mouth daily.   clobetasol 0.05 % external solution Commonly known as: TEMOVATE Apply topically daily as needed.   multivitamin with minerals Tabs tablet Take 1 tablet by mouth daily.   propranolol ER 60 MG 24 hr capsule Commonly known as: INDERAL LA Take 1 capsule (60 mg total) by mouth daily.   SUMAtriptan 50 MG tablet Commonly known as: IMITREX TAKE 1-2 TABLETS BY MOUTH AS NEEDED FOR MIGRAINE. MAY REPEAT IN 2 HOURS IF HEADACHE PERSISTS OR RECURS   Vitamin D 50 MCG (2000 UT) Caps Take 2 capsules by mouth.          Objective:   Physical Exam BP (!) 160/80 (BP Location: Left Arm, Patient Position: Sitting, Cuff Size: Small)    Pulse (!) 49    Temp 98.1 F (36.7 C) (Oral)    Resp 16    Ht 5\' 3"  (1.6 m)    Wt 160 lb 8 oz (72.8 kg)    SpO2 100%    BMI 28.43 kg/m  General:   Well developed, NAD, BMI noted. HEENT:   Normocephalic . Face symmetric, atraumatic Neck: Full range of motion Lungs:  CTA B Normal respiratory effort, no intercostal retractions, no accessory muscle use. Heart: RRR,  no murmur.  Lower extremities: no pretibial edema bilaterally MSK: Left shoulder normal Right shoulder, range of motion normal, not tender to palpation, mild pain elicited with arm elevation internal rotation. Skin: Not pale. Not jaundice Neurologic:  alert & oriented X3.  Speech normal, gait appropriate for age and unassisted Psych--  Cognition and judgment appear intact.  Cooperative with normal attention span and concentration.  Behavior appropriate. No anxious or depressed appearing.      Assessment    Assessment  Prediabetes: A1c 6.0 (03-2015) HTN (on BB) MSK: --DJD --Neck pain: Saw neurosurgery, MRI was done, DX DJD, nonsurgical candidate (2013 ) --s/p L & R CTS surgery  Osteopenia first  T score -1.2 (06/2016) rx ca and vit D  Migraines  (onn BB and imitrex) Ear canal eczema: topical steroids prn  PLAN: Prediabetes: Last A1c satisfactory HTN: BP today elevated, at home consistently in the 130, 140 range.  Continue beta-blockers Knee pain: Had right knee pain, improved. Right shoulder pain:  As described above, tendinitis?  We agree on ibuprofen with GI precautions, keep the shoulder moving to prevent frozen shoulder, will discuss with sports medicine at the next office visit.[name] Dyslipidemia: Although cholesterol is not high, her 10-year risk factor for cardiovascular disease is 25%, explained the benefits of atorvastatin, she will think about it. Preventive care reviewed. RTC 6 months   This visit occurred during the SARS-CoV-2 public health emergency.  Safety protocols were in place, including screening questions prior to the visit, additional usage of staff PPE, and extensive cleaning of exam room while observing appropriate contact time as indicated for disinfecting solutions.

## 2020-02-03 NOTE — Assessment & Plan Note (Signed)
Preventive care reviewed --Td 2021 -PNM 23: 2015; Prevnar: 2016  -zostavax 03-2015; s/ shingrix x2 - s/p covid vax x 3  - flu shot today --Female care: @ [facility] Dr Philis Pique, [facility] there  --ELM:RAJHHIDUPBD 01-2015, + polyps, Dr Ardis Hughs, due for repeat colonoscopy, will wait for GI letter

## 2020-02-03 NOTE — Assessment & Plan Note (Signed)
Prediabetes: Last A1c satisfactory HTN: BP today elevated, at home consistently in the 130, 140 range.  Continue beta-blockers Knee pain: Had right knee pain, improved. Right shoulder pain: As described above, tendinitis?  We agree on ibuprofen with GI precautions, keep the shoulder moving to prevent frozen shoulder, will discuss with sports medicine at the next office visit.[name] Dyslipidemia: Although cholesterol is not high, her 10-year risk factor for cardiovascular disease is 25%, explained the benefits of atorvastatin, she will think about it. Preventive care reviewed. RTC 6 months

## 2020-02-09 ENCOUNTER — Ambulatory Visit: Payer: Medicare Other | Admitting: Internal Medicine

## 2020-03-05 NOTE — Progress Notes (Signed)
Lori Goodwin [place] Phone: [phone] Subjective:   Lori Goodwin, am serving as a scribe for Dr. Hulan Saas. This visit occurred during the SARS-CoV-2 public health emergency.  Safety protocols were in place, including screening questions prior to the visit, additional usage of staff PPE, and extensive cleaning of exam room while observing appropriate contact time as indicated for disinfecting solutions.   I'm seeing this patient by the request  of:  Colon Branch, MD  CC: Right shoulder pain  IFO:YDXAJOINOM  Lori Goodwin is a 74 y.o. female coming in with complaint of right shoulder pain for one month. Pain in anterior aspect. Pain is worse at night. Having a hard time sleeping. Pain radiates down into bicep. Goodwin history of shoulder injury.  Patient describes the pain is severity of 5 out of 10.  Patient states that last night had very difficulty sleeping.  During the day can do some more activity but repetitive motion seems to make it worse.  Radiation down the arm but never past the forearm.  Denies any associated neck pain.      Past Medical History:  Diagnosis Date  . Allergic rhinitis   . Common migraine   . Hypertension    Past Surgical History:  Procedure Laterality Date  . CARPAL TUNNEL RELEASE Left 01/05/2016  . CARPAL TUNNEL RELEASE Right 04/2018  . TYMPANOSTOMY TUBE PLACEMENT     Social History   Socioeconomic History  . Marital status: Married    Spouse name: Not on file  . Number of children: 3  . Years of education: Not on file  . Highest education level: Not on file  Occupational History  . 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 Smoking status: Never Smoker  . Smokeless tobacco: Never Used  Substance and Sexual Activity  . Alcohol use: Goodwin  . Drug use: Goodwin  . Sexual activity: Not on file  Other Topics Concern  . Not on file  Social  History Narrative   [place] from [place]   Divorced, remarried   Lives w/ husband   Lori Goodwin her son lives in [place]     3 children ,1 son is paraplegic , has another that is a MD,other a PA     Social Determinants of Radio broadcast assistant Strain:   . Difficulty of Paying Living Expenses: Not on file  Food Insecurity:   . Worried About Charity fundraiser in the Last Year: Not on file  . Ran Out of Food in the Last Year: Not on file  Transportation Needs:   . Lack of Transportation (Medical): Not on file  . Lack of Transportation (Non-Medical): Not on file  Physical Activity:   . Days of Exercise per Week: Not on file  . Minutes of Exercise per Session: Not on file  Stress:   . Feeling of Stress : Not on file  Social Connections:   . Frequency of Communication with Friends and Family: Not on file  . Frequency of Social Gatherings with Friends and Family: Not on file  . Attends Religious Services: Not on file  . Active Member of Clubs or Organizations: Not on file  . Attends Archivist Meetings: Not on file  . Marital Status: Not on file   Allergies  Allergen Reactions  . Tylenol [Acetaminophen] Shortness Of Breath   Family History  Problem  Relation Age of Onset  . Colon cancer Neg Hx   . Breast cancer Neg Hx   . Diabetes Neg Hx   . Heart attack Neg Hx      Current Outpatient Medications (Cardiovascular):  .  amLODipine (NORVASC) 5 MG tablet, Take 1 tablet (5 mg total) by mouth daily. .  propranolol ER (INDERAL LA) 60 MG 24 hr capsule, Take 1 capsule (60 mg total) by mouth daily.   Current Outpatient Medications (Analgesics):  [name]  SUMAtriptan (IMITREX) 50 MG tablet, TAKE 1-2 TABLETS BY MOUTH AS NEEDED FOR MIGRAINE. MAY REPEAT IN 2 HOURS IF HEADACHE PERSISTS OR RECURS   Current Outpatient Medications (Other):  [name]  Cholecalciferol (VITAMIN D) 2000 units CAPS, Take 2 capsules by mouth. .  clobetasol (TEMOVATE) 0.05 % external solution, Apply topically  daily as needed. .  Multiple Vitamin (MULTIVITAMIN WITH MINERALS) TABS, Take 1 tablet by mouth daily.   Reviewed prior external information including notes and imaging from  primary care provider As well as notes that were available from care everywhere and other healthcare systems.  Past medical history, social, surgical and family history all reviewed in electronic medical record.  Goodwin pertanent information unless stated regarding to the chief complaint.   Review of Systems:  Goodwin headache, visual changes, nausea, vomiting, diarrhea, constipation, dizziness, abdominal pain, skin rash, fevers, chills, night sweats, weight loss, swollen lymph nodes, body aches, joint swelling, chest pain, shortness of breath, mood changes. POSITIVE muscle aches  Objective  Blood pressure 112/82, pulse (!) 51, height 5\' 3"  (1.6 m), weight 162 lb (73.5 kg), SpO2 99 %.   General: Goodwin apparent distress alert and oriented x3 mood and affect normal, dressed appropriately.  HEENT: Pupils equal, extraocular movements intact  Respiratory: Patient's speak in full sentences and does not appear short of breath  Cardiovascular: Goodwin lower extremity edema, non tender, Goodwin erythema  Right shoulder exam shows the patient has very mild atrophy of the surrounding musculature.  Does have near full passive range of motion with very mild crepitus.  Rotator cuff strength 4 out of 5 compared to the contralateral side.  Positive impingement with Hawkins and Neer's.  Limited musculoskeletal ultrasound was performed and interpreted by [name]  Limited ultrasound of patient's right shoulder shows there is significant arthritic changes especially of the acromioclavicular joint and moderate of the glenohumeral joint.  Patient does have degenerative tearing of the rotator cuff noted.  Appears to be chronic does have a reactive bursitis noted.  Hypoechoic changes within the bicep tendon sheath noted Impression: Rotator cuff arthropathy    Impression and Recommendations:     The above documentation has been reviewed and is accurate and complete [name], DO

## 2020-03-06 ENCOUNTER — Ambulatory Visit (INDEPENDENT_AMBULATORY_CARE_PROVIDER_SITE_OTHER): Payer: Medicare Other | Admitting: Family Medicine

## 2020-03-06 ENCOUNTER — Encounter: Payer: Self-pay | Admitting: Family Medicine

## 2020-03-06 ENCOUNTER — Ambulatory Visit (INDEPENDENT_AMBULATORY_CARE_PROVIDER_SITE_OTHER): Payer: Medicare Other

## 2020-03-06 ENCOUNTER — Other Ambulatory Visit: Payer: Self-pay

## 2020-03-06 ENCOUNTER — Ambulatory Visit: Payer: Self-pay

## 2020-03-06 VITALS — BP 112/82 | HR 51 | Ht 63.0 in | Wt 162.0 lb

## 2020-03-06 DIAGNOSIS — M25511 Pain in right shoulder: Secondary | ICD-10-CM | POA: Diagnosis not present

## 2020-03-06 DIAGNOSIS — M12811 Other specific arthropathies, not elsewhere classified, right shoulder: Secondary | ICD-10-CM | POA: Diagnosis not present

## 2020-03-06 NOTE — Patient Instructions (Addendum)
Ice 20 minutes 2 times daily. Usually after activity and before bed. pennsaid pinkie amount topically 2 times daily as needed. Hand in Periferal view Physical therapy has been sent to [facility] location X-ray on the way out See me again in 5-6 weeks will discuss injections or MRI as next steps

## 2020-03-06 NOTE — Assessment & Plan Note (Addendum)
Patient has what appears to be arthritic changes with likely rotator cuff arthropathy.  Patient's wants to start formal physical therapy.  Does not remember any acute injury and I think that patient could do well with conservative therapy.  X-rays are pending, refer to formal physical therapy.  Patient will follow up again in 6 to 8 weeks.  Continue to have difficulty we will consider the possibility of this imaging versus injection.

## 2020-03-20 ENCOUNTER — Ambulatory Visit: Payer: Medicare Other | Attending: Family Medicine | Admitting: Physical Therapy

## 2020-03-20 ENCOUNTER — Other Ambulatory Visit: Payer: Self-pay

## 2020-03-20 DIAGNOSIS — M25511 Pain in right shoulder: Secondary | ICD-10-CM | POA: Insufficient documentation

## 2020-03-20 DIAGNOSIS — M6281 Muscle weakness (generalized): Secondary | ICD-10-CM | POA: Insufficient documentation

## 2020-03-20 DIAGNOSIS — R293 Abnormal posture: Secondary | ICD-10-CM | POA: Insufficient documentation

## 2020-03-20 NOTE — Patient Instructions (Signed)
    Home exercise program created by Kely Dohn, PT.  For questions, please contact Coralee Edberg via phone at [phone] or email at [email or web address]  [facility] [place] [address]  [address] [place], [place], [address] Phone: [phone]   Fax:  [phone]    

## 2020-03-20 NOTE — Therapy (Signed)
[facility] [place] [facility] [address]  [place], [place], [address] Phone: [phone]   Fax:  [phone]  Physical Therapy Evaluation  Patient Details  Name: Lori Goodwin MRN: [id number] Date of Birth: 1945-07-01 Referring Provider (PT): [name], DO   Encounter Date: 03/20/2020   PT End of Session - 03/20/20 0840    Visit Number 1    Number of Visits 12    Date for PT Re-Evaluation 05/01/20    Authorization Type Medicare + BCBS    PT Start Time 0840    PT Stop Time 0933    PT Time Calculation (min) 53 min    Activity Tolerance Patient tolerated treatment well    Behavior During Therapy [facility] for tasks assessed/performed           Past Medical History:  Diagnosis Date   Allergic rhinitis    Common migraine    Hypertension     Past Surgical History:  Procedure Laterality Date   CARPAL TUNNEL RELEASE Left 01/05/2016   CARPAL TUNNEL RELEASE Right 04/2018   TYMPANOSTOMY TUBE PLACEMENT      There were no vitals filed for this visit.    Subjective Assessment - 03/20/20 0843    Subjective Pt reports the days she does a lot of meal prep or has to write on the board at work, her shoulder and arm will really hurt by the end of the day. Feel that she has full ROM but when she tries to reach for something (like picking up a cup of coffee) it will hurt. Shoulder/arm is also more painful when resting in certain positions.    Limitations Lifting    Diagnostic tests 03/06/20 - R shoulder x-ray: Mild degenerative changes but no acute bony findings.  03/06/20 - Limited ultrasound of patient's right shoulder shows there is significant arthritic changes especially of the acromioclavicular joint and moderate of the glenohumeral joint.  Patient does have degenerative tearing of the rotator cuff noted.  Appears to be chronic does have a reactive bursitis noted.  Hypoechoic changes within the bicep tendon sheath noted   Impression: Rotator cuff arthropathy    Patient Stated Goals "I want to be comfortable doing everything I do w/o pain"    Currently in Pain? Yes    Pain Score 3     Pain Location Shoulder    Pain Orientation Right    Pain Descriptors / Indicators Constant;Dull;Aching    Pain Type Acute pain    Pain Radiating Towards achiness into R upper arm    Pain Onset More than a month ago   ~2 months   Pain Frequency Constant    Aggravating Factors  reaching for coffee cup, writing on/erasing board at school, meal prep    Pain Relieving Factors ice, occasionally ibuprofen    Effect of Pain on Daily Activities tries to do everything as usual but painful at times    Multiple Pain Sites No              [facility] PT Assessment - 03/20/20 0840      Assessment   Medical Diagnosis R shoulder arthropathy    Referring Provider (PT) [name], DO    Onset Date/Surgical Date --   ~2 months   Hand Dominance Right    Next MD Visit 04/10/20    Prior Therapy PT episodes for R knee pain, LBP & L LE radiculopathy earlier this year  Precautions   Precautions None      Balance Screen   Has the patient fallen in the past 6 months No    Has the patient had a decrease in activity level because of a fear of falling?  No    Is the patient reluctant to leave their home because of a fear of falling?  No      Home Environment   Living Environment Private residence    Type of 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    Leisure walking, gardening      Cognition   Overall Cognitive Status Within Functional Limits for tasks assessed      Observation/Other Assessments   Focus on Therapeutic Outcomes (FOTO)  Shoulder - 55% (45% limitation); Predicted 66% (34% limitation)      Posture/Postural  Control   Posture/Postural Control Postural limitations    Postural Limitations Forward head;Rounded Shoulders    Posture Comments R shoulder protracted with scapula rounded fwd on rib cage      AROM   AROM Assessment Site Shoulder    Right/Left Shoulder Right;Left    Right Shoulder Extension 35 Degrees    Right Shoulder Flexion 149 Degrees    Right Shoulder ABduction 156 Degrees   pain upon lowering arm   Right Shoulder Internal Rotation --   FIR to T11   Right Shoulder External Rotation --   FER to T3   Left Shoulder Extension 40 Degrees    Left Shoulder Flexion 150 Degrees    Left Shoulder ABduction 158 Degrees    Left Shoulder Internal Rotation --   FIR to T10   Left Shoulder External Rotation --   FER to T2 - cramping in biceps     Strength   Strength Assessment Site Shoulder    Right/Left Shoulder Right;Left    Right Shoulder Flexion 3+/5    Right Shoulder Extension 4/5    Right Shoulder ABduction 4-/5   pain   Right Shoulder Internal Rotation 4-/5    Right Shoulder External Rotation 3/5   unable to take resistance d/t pain   Left Shoulder Flexion 4-/5    Left Shoulder Extension 4/5    Left Shoulder ABduction 4/5    Left Shoulder Internal Rotation 4/5    Left Shoulder External Rotation 4-/5      Palpation   Palpation comment increased muscle tension with ttp over UT, LS, pecs, ant deltoid, infraspinatus & teres group                      Objective measurements completed on examination: See above findings.       [facility] PT Treatment/Exercise - 03/20/20 0840      Exercises   Exercises Shoulder      Shoulder Exercises: Seated   Retraction Both;10 reps;AROM;Strengthening    Retraction Limitations + depression      Shoulder Exercises: Stretch   Corner Stretch 30 seconds;3 reps    Corner Stretch Limitations 3-way doorway pec stretch      Neck Exercises: Stretches   Upper Trapezius Stretch Right;30 seconds;1 rep    Levator Stretch Right;30  seconds;1 rep  PT Education - 03/20/20 0930    Education Details PT eval findings, anticipated POC & intiial HEP    Person(s) Educated Patient    Methods Explanation;Demonstration;Verbal cues;Handout    Comprehension Verbalized understanding;Verbal cues required;Returned demonstration;Need further instruction            PT Short Term Goals - 03/20/20 0933      PT SHORT TERM GOAL #1   Title Patient will be independent with initial HEP    Status New    Target Date 04/10/20             PT Long Term Goals - 03/20/20 0933      PT LONG TERM GOAL #1   Title Patient will be independent with ongoing/advanced HEP for self-management at home    Status New    Target Date 05/01/20      PT LONG TERM GOAL #2   Title Improve posture and alignment with patient to demonstrate improved upright posture with posterior shoulder girdle engaged    Status New    Target Date 05/01/20      PT LONG TERM GOAL #3   Title Patient will demonstrate improved R shoulder strength to >/= 4 to 4+/5 for functional UE use    Status New    Target Date 05/01/20      PT LONG TERM GOAL #4   Title Patient to report ability to perform ADLs, household, and work-related tasks without limitation due to R shoulder pain, LOM or weakness    Status New    Target Date 05/01/20                  Plan - 03/20/20 0933    Clinical Impression Statement Lori Goodwin is a 74 y/o female who presents to OP PT for acute R shoulder pain attributed to RTC arthropathy. She reports prior onset of pain w/o known trigger/MOI ~2 months ago but does recall 2 falls in the bathtub last year where she feels that she may have injured her shoulder. Deficits include intermittent R shoulder pain, forward head and rounded shoulder with R>L scapular protraction, AROM WFL but with painful end ROM and decreased R shoulder strength with pain on MMT resistance for abduction & ER. 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 Lori Goodwin will benefit from skilled PT to address above deficits, improve posture and restore pain-free ROM and strength to allow her to resume normal daily activities without pain interference.    Personal Factors and Comorbidities Comorbidity 3+;Time since onset of injury/illness/exacerbation;Past/Current Experience;Age    Comorbidities B carpal tunnel release, HTN, lumbar DJD, R knee pain, midfoot OA, L anterior tibialis pain/tendinitis, allergies    Examination-Activity Limitations Reach Overhead;Lift;Carry    Examination-Participation Restrictions Meal Prep;Occupation;Cleaning    Stability/Clinical Decision Making Stable/Uncomplicated    Clinical Decision Making Low    Rehab Potential Excellent    PT Frequency 2x / week   hopefully tapering to 1x/wk   PT Duration 6 weeks    PT Treatment/Interventions ADLs/Self Care Home Management;Cryotherapy;Electrical Stimulation;Iontophoresis 4mg /ml Dexamethasone;Moist Heat;Ultrasound;Functional mobility training;Therapeutic activities;Therapeutic exercise;Neuromuscular re-education;Patient/family education;Manual techniques;Passive range of motion;Dry needling;Taping;Vasopneumatic Device;Joint Manipulations    PT Next Visit Plan Review initial HEP; progress postural and RTC strengthening; manual therapy and modalities PRN    Consulted and Agree with Plan of Care Patient           Patient will benefit from skilled therapeutic  intervention in order to improve the following deficits and impairments:  Decreased activity tolerance,Decreased strength,Increased fascial restricitons,Increased muscle spasms,Impaired perceived functional ability,Impaired flexibility,Impaired UE functional use,Postural dysfunction,Improper body mechanics,Pain  Visit Diagnosis: Acute pain of right shoulder  Abnormal posture  Muscle weakness (generalized)     Problem List Patient Active  Problem List   Diagnosis Date Noted   Rotator cuff arthropathy of right shoulder 03/06/2020   Lipoma of lower extremity 01/03/2020   Patellofemoral arthritis of right knee 11/16/2019   Acute left lumbar radiculopathy 09/14/2019   Left tibialis posterior tendinitis 06/29/2019   Hyperglycemia 10/05/2018   Hypertension 05/10/2018   Abnormality of gait 06/30/2016   Arthritis of midfoot 06/02/2016   PCP NOTES >>>> 03/18/2015   CTS (carpal tunnel syndrome) 08/01/2014   Eczema, ear canal 05/15/2014   Eustachian tube dysfunction 04/08/2014   Annual physical exam 02/19/2014   DJD (degenerative joint disease) 01/15/2012   Fatigue 10/31/2010   Menopause 10/31/2010   ALLERGIC RHINITIS 01/07/2007   COMMON MIGRAINE 12/15/2006    Percival Spanish, PT, MPT 03/20/2020, 11:19 AM  [facility] [address]  [place], [place], [address] Phone: [phone]   Fax:  [phone]  Name: Lori Goodwin MRN: [id number] Date of Birth: 06-21-1945

## 2020-04-02 ENCOUNTER — Ambulatory Visit: Payer: Medicare Other | Admitting: Physical Therapy

## 2020-04-04 ENCOUNTER — Ambulatory Visit: Payer: Medicare Other | Admitting: Physical Therapy

## 2020-04-08 ENCOUNTER — Ambulatory Visit: Payer: Medicare Other | Admitting: Physical Therapy

## 2020-04-10 ENCOUNTER — Ambulatory Visit: Payer: Medicare Other | Admitting: Family Medicine

## 2020-04-12 ENCOUNTER — Ambulatory Visit: Payer: Medicare Other | Admitting: Physical Therapy

## 2020-04-15 ENCOUNTER — Encounter: Payer: Self-pay | Admitting: Physical Therapy

## 2020-04-15 ENCOUNTER — Other Ambulatory Visit: Payer: Self-pay

## 2020-04-15 ENCOUNTER — Ambulatory Visit: Payer: Medicare Other | Attending: Family Medicine | Admitting: Physical Therapy

## 2020-04-15 DIAGNOSIS — M6281 Muscle weakness (generalized): Secondary | ICD-10-CM

## 2020-04-15 DIAGNOSIS — M25511 Pain in right shoulder: Secondary | ICD-10-CM | POA: Diagnosis not present

## 2020-04-15 DIAGNOSIS — R293 Abnormal posture: Secondary | ICD-10-CM | POA: Insufficient documentation

## 2020-04-15 NOTE — Therapy (Signed)
[facility] [address]  [place], [place], [address] Phone: [phone]   Fax:  [phone]  Physical Therapy Treatment  Patient Details  Name: Lori Goodwin MRN: [id number] Date of Birth: 13-Jan-1946 Referring Provider (PT): [name], DO   Encounter Date: 04/15/2020   PT End of Session - 04/15/20 0806    Visit Number 2    Number of Visits 12    Date for PT Re-Evaluation 05/01/20    Authorization Type Medicare + BCBS    PT Start Time 0806    PT Stop Time 0845    PT Time Calculation (min) 39 min    Activity Tolerance Patient tolerated treatment well    Behavior During Therapy [facility] for tasks assessed/performed           Past Medical History:  Diagnosis Date  . Allergic rhinitis   . Common migraine   . Hypertension     Past Surgical History:  Procedure Laterality Date  . CARPAL TUNNEL RELEASE Left 01/05/2016  . CARPAL TUNNEL RELEASE Right 04/2018  . TYMPANOSTOMY TUBE PLACEMENT      There were no vitals filed for this visit.   Subjective Assessment - 04/15/20 0811    Subjective Pt reports pain has been variable - no pain currently but did have increased pain last night. Notes pain seems to be worse on days where she does the stretch at the doorway.    Diagnostic tests 03/06/20 - R shoulder x-ray: Mild degenerative changes but no acute bony findings.  03/06/20 - Limited ultrasound of patient's right shoulder shows there is significant arthritic changes especially of the acromioclavicular joint and moderate of the glenohumeral joint.  Patient does have degenerative tearing of the rotator cuff noted.  Appears to be chronic does have a reactive bursitis noted.  Hypoechoic changes within the bicep tendon sheath noted  Impression: Rotator cuff arthropathy    Patient Stated Goals "I want to be comfortable doing everything I do w/o pain"    Currently in Pain? No/denies    Pain Onset More than a month ago    ~2 months             [facility] PT Assessment - 04/15/20 0806      Assessment   Medical Diagnosis R shoulder arthropathy    Referring Provider (PT) [name], DO    Hand Dominance Right    Next MD Visit 05/01/20                         [facility] Adult PT Treatment/Exercise - 04/15/20 0806      Exercises   Exercises Shoulder      Shoulder Exercises: Standing   Row Both;10 reps;Strengthening;Theraband    Theraband Level (Shoulder Row) Level 1 (Yellow)    Row Limitations cues for scap retraction & depression, tucking elbows in toward ribs    Retraction Both;10 reps;Strengthening;Theraband    Theraband Level (Shoulder Retraction) Level 1 (Yellow)    Retraction Limitations + mini-shoulder extensions      Shoulder Exercises: Isometric Strengthening   Flexion Limitations 10 x 5"    Extension Limitations 10 x 5"    External Rotation Limitations 10 x 5"    Internal Rotation Limitations 10 x 5"    ABduction Limitations 10 x 5"      Shoulder Exercises: IT sales professional 30 seconds;3 reps    Corner Stretch Limitations 3-way  doorway pec stretch - increased disconfory noted in mid & high positions      Neck Exercises: Stretches   Upper Trapezius Stretch Right;30 seconds;1 rep    Levator Stretch Right;30 seconds;1 rep                  PT Education - 04/15/20 0843    Education Details HEP update - yellow TB rows/retraction, R shoulder isometrics    Person(s) Educated Patient    Methods Explanation;Demonstration;Verbal cues;Handout    Comprehension Verbalized understanding;Verbal cues required;Returned demonstration;Need further instruction            PT Short Term Goals - 04/15/20 0810      PT SHORT TERM GOAL #1   Title Patient will be independent with initial HEP    Status Achieved   04/15/20            PT Long Term Goals - 04/15/20 0810      PT LONG TERM GOAL #1   Title Patient will be independent with ongoing/advanced HEP for  self-management at home    Status On-going    Target Date 05/01/20      PT LONG TERM GOAL #2   Title Improve posture and alignment with patient to demonstrate improved upright posture with posterior shoulder girdle engaged    Status On-going    Target Date 05/01/20      PT LONG TERM GOAL #3   Title Patient will demonstrate improved R shoulder strength to >/= 4 to 4+/5 for functional UE use    Status On-going    Target Date 05/01/20      PT LONG TERM GOAL #4   Title Patient to report ability to perform ADLs, household, and work-related tasks without limitation due to R shoulder pain, LOM or weakness    Status On-going    Target Date 05/01/20                 Plan - 04/15/20 8676    Clinical Impression Statement Lori Goodwin returning to PT after extended absence due to quarantining for COVID. She reports pain has been variable and notes pain seems to be worse on days where she has completely the doorway pec stretch but not always. Pt able to provide good return demonstration of initial HEP (STG met) but cautioned to avoid painful ROM and/or positions on doorway pec stretch. Progressed postural/scapular strengthening with additional of yellow TB rows/retraction as well as shoulder strengthening with isometrics (increased pain with resisted AROM) with good tolerance therefore HEP updated accordingly.    Comorbidities B carpal tunnel release, HTN, lumbar DJD, R knee pain, midfoot OA, L anterior tibialis pain/tendinitis, allergies    Rehab Potential Excellent    PT Frequency 2x / week   hopefully tapering to 1x/wk   PT Duration 6 weeks    PT Treatment/Interventions ADLs/Self Care Home Management;Cryotherapy;Electrical Stimulation;Iontophoresis 53m/ml Dexamethasone;Moist Heat;Ultrasound;Functional mobility training;Therapeutic activities;Therapeutic exercise;Neuromuscular re-education;Patient/family education;Manual techniques;Passive range of motion;Dry needling;Taping;Vasopneumatic  Device;Joint Manipulations    PT Next Visit Plan progress postural and RTC strengthening; manual therapy and modalities PRN           Patient will benefit from skilled therapeutic intervention in order to improve the following deficits and impairments:     Visit Diagnosis: Acute pain of right shoulder  Abnormal posture  Muscle weakness (generalized)     Problem List Patient Active Problem List   Diagnosis Date Noted  . Rotator cuff arthropathy of right shoulder 03/06/2020  . Lipoma of lower  extremity 01/03/2020  . Patellofemoral arthritis of right knee 11/16/2019  . Acute left lumbar radiculopathy 09/14/2019  . Left tibialis posterior tendinitis 06/29/2019  . Hyperglycemia 10/05/2018  . Hypertension 05/10/2018  . Abnormality of gait 06/30/2016  . Arthritis of midfoot 06/02/2016  . PCP NOTES >>>> 03/18/2015  . CTS (carpal tunnel syndrome) 08/01/2014  . Eczema, ear canal 05/15/2014  . Eustachian tube dysfunction 04/08/2014  . Annual physical exam 02/19/2014  . DJD (degenerative joint disease) 01/15/2012  . Fatigue 10/31/2010  . Menopause 10/31/2010  . ALLERGIC RHINITIS 01/07/2007  . COMMON MIGRAINE 12/15/2006    Percival Spanish, PT, MPT 04/15/2020, 8:47 AM  [facility] [address]  [place], [place], [address] Phone: [phone]   Fax:  [phone]  Name: Lori Goodwin MRN: [id number] Date of Birth: [birth year]

## 2020-04-15 NOTE — Patient Instructions (Signed)
    Home exercise program created by Galileah Piggee, PT.  For questions, please contact Anelia Carriveau via phone at [phone] or email at [email or web address]  [facility] [address]  [address] [place], [place], [address] Phone: [phone]   Fax:  [phone]    

## 2020-04-18 ENCOUNTER — Ambulatory Visit: Payer: Medicare Other | Admitting: Physical Therapy

## 2020-04-18 ENCOUNTER — Encounter: Payer: Self-pay | Admitting: Physical Therapy

## 2020-04-18 ENCOUNTER — Other Ambulatory Visit: Payer: Self-pay

## 2020-04-18 DIAGNOSIS — M25511 Pain in right shoulder: Secondary | ICD-10-CM | POA: Diagnosis not present

## 2020-04-18 DIAGNOSIS — R293 Abnormal posture: Secondary | ICD-10-CM

## 2020-04-18 DIAGNOSIS — M6281 Muscle weakness (generalized): Secondary | ICD-10-CM | POA: Diagnosis not present

## 2020-04-18 NOTE — Therapy (Signed)
[facility] [address]  [place], [place], [address] Phone: [phone]   Fax:  [phone]  Physical Therapy Treatment  Patient Details  Name: SHAELIN LALLEY MRN: [id number] Date of Birth: [birth year] Referring Provider (PT): [name], DO   Encounter Date: 04/18/2020   PT End of Session - 04/18/20 0808    Visit Number 3    Number of Visits 12    Date for PT Re-Evaluation 05/01/20    Authorization Type Medicare + BCBS    PT Start Time 0808   Pt arrrived late   PT Stop Time 0846    PT Time Calculation (min) 38 min    Activity Tolerance Patient tolerated treatment well    Behavior During Therapy [facility] for tasks assessed/performed           Past Medical History:  Diagnosis Date  . Allergic rhinitis   . Common migraine   . Hypertension     Past Surgical History:  Procedure Laterality Date  . CARPAL TUNNEL RELEASE Left 01/05/2016  . CARPAL TUNNEL RELEASE Right 04/2018  . TYMPANOSTOMY TUBE PLACEMENT      There were no vitals filed for this visit.   Subjective Assessment - 04/18/20 0809    Subjective Pt denies pain today but notes that she is "always aware" of her shoulder.    Diagnostic tests 03/06/20 - R shoulder x-ray: Mild degenerative changes but no acute bony findings.  03/06/20 - Limited ultrasound of patient's right shoulder shows there is significant arthritic changes especially of the acromioclavicular joint and moderate of the glenohumeral joint.  Patient does have degenerative tearing of the rotator cuff noted.  Appears to be chronic does have a reactive bursitis noted.  Hypoechoic changes within the bicep tendon sheath noted  Impression: Rotator cuff arthropathy    Patient Stated Goals "I want to be comfortable doing everything I do w/o pain"    Currently in Pain? No/denies    Pain Onset More than a month ago   ~2 months                            [facility] Adult PT  Treatment/Exercise - 04/18/20 0808      Exercises   Exercises Shoulder      Shoulder Exercises: Standing   Row Both;10 reps;Strengthening;Theraband    Theraband Level (Shoulder Row) Level 1 (Yellow)    Row Limitations cues for scap retraction & depression, tucking elbows in toward ribs - slightly decreased retraction on R but improved after tactile cueing    Retraction Both;10 reps;Strengthening;Theraband    Theraband Level (Shoulder Retraction) Level 1 (Yellow)    Retraction Limitations + mini-shoulder extensions    Other Standing Exercises B yellow TB straight arm retraction + slight shoulder ER 10 x 3-5"    Other Standing Exercises B yellow TB shawl crossed behind back - retraction & deperession 10 x 5"      Shoulder Exercises: ROM/Strengthening   UBE (Upper Arm Bike) L1.5 x 6 min (3' fwd/3' back)      Shoulder Exercises: Isometric Strengthening   Flexion Limitations 10 x 5"    Extension Limitations 10 x 5"    External Rotation Limitations 10 x 5"    Internal Rotation Limitations 10 x 5"   cues for proper alignment and direction of force   ABduction Limitations 10 x 5"   cues for proper alignment and direction  of force     Manual Therapy   Manual Therapy Soft tissue mobilization;Myofascial release;Passive ROM    Manual therapy comments supine    Soft tissue mobilization STM/DTM to R anterolateral deltoid, pecs, teres group & subscapularis    Myofascial Release STM/DTM to R anterolateral deltoid, pec major/minor, teres group & subscapularis    Passive ROM gentle R shoulder PROM in all planes with pause for stretch at end ROM; MWM into flexion & ER                    PT Short Term Goals - 04/15/20 0810      PT SHORT TERM GOAL #1   Title Patient will be independent with initial HEP    Status Achieved   04/15/20            PT Long Term Goals - 04/15/20 0810      PT LONG TERM GOAL #1   Title Patient will be independent with ongoing/advanced HEP for  self-management at home    Status On-going    Target Date 05/01/20      PT LONG TERM GOAL #2   Title Improve posture and alignment with patient to demonstrate improved upright posture with posterior shoulder girdle engaged    Status On-going    Target Date 05/01/20      PT LONG TERM GOAL #3   Title Patient will demonstrate improved R shoulder strength to >/= 4 to 4+/5 for functional UE use    Status On-going    Target Date 05/01/20      PT LONG TERM GOAL #4   Title Patient to report ability to perform ADLs, household, and work-related tasks without limitation due to R shoulder pain, LOM or weakness    Status On-going    Target Date 05/01/20                 Plan - 04/18/20 0847    Clinical Impression Statement [name] denies pain today but notes continued awareness of R shoulder. Palpation revealing increased muscle tension and taut bands/TPs in R anterolateral deltoid, pecs, teres group & subscapularis - addressed with manual STM/TPR along with MWM to facilitate increased flexion & ER ROM. Reviewed latest HEP update with cues necessary to ensure good scapular activation into retraction and depression while avoiding shoulder elevation/hiking - motion improving with addition of slight ER as well as "shawl" exercise with band behind back. Pt also requiring repeated instruction with IR/ER isometrics for proper positioning and alignment of shoulder as well as direction of force for isometric pressure.    Comorbidities B carpal tunnel release, HTN, lumbar DJD, R knee pain, midfoot OA, L anterior tibialis pain/tendinitis, allergies    Rehab Potential Excellent    PT Frequency 2x / week   hopefully tapering to 1x/wk   PT Duration 6 weeks    PT Treatment/Interventions ADLs/Self Care Home Management;Cryotherapy;Electrical Stimulation;Iontophoresis 4mg /ml Dexamethasone;Moist Heat;Ultrasound;Functional mobility training;Therapeutic activities;Therapeutic exercise;Neuromuscular  re-education;Patient/family education;Manual techniques;Passive range of motion;Dry needling;Taping;Vasopneumatic Device;Joint Manipulations    PT Next Visit Plan progress postural and RTC strengthening; manual therapy and modalities PRN           Patient will benefit from skilled therapeutic intervention in order to improve the following deficits and impairments:     Visit Diagnosis: Acute pain of right shoulder  Abnormal posture  Muscle weakness (generalized)     Problem List Patient Active Problem List   Diagnosis Date Noted  . Rotator cuff arthropathy of right  shoulder 03/06/2020  . Lipoma of lower extremity 01/03/2020  . Patellofemoral arthritis of right knee 11/16/2019  . Acute left lumbar radiculopathy 09/14/2019  . Left tibialis posterior tendinitis 06/29/2019  . Hyperglycemia 10/05/2018  . Hypertension 05/10/2018  . Abnormality of gait 06/30/2016  . Arthritis of midfoot 06/02/2016  . PCP NOTES >>>> 03/18/2015  . CTS (carpal tunnel syndrome) 08/01/2014  . Eczema, ear canal 05/15/2014  . Eustachian tube dysfunction 04/08/2014  . Annual physical exam 02/19/2014  . DJD (degenerative joint disease) 01/15/2012  . Fatigue 10/31/2010  . Menopause 10/31/2010  . ALLERGIC RHINITIS 01/07/2007  . COMMON MIGRAINE 12/15/2006    Percival Spanish, PT, MPT 04/18/2020, 10:10 AM  [facility] [address]  [place], [place], [address] Phone: [phone]   Fax:  [phone]  Name: RASEEL JANS MRN: [id number] Date of Birth: 06-22-1945

## 2020-04-22 ENCOUNTER — Ambulatory Visit: Payer: Medicare Other | Admitting: Physical Therapy

## 2020-04-25 ENCOUNTER — Encounter: Payer: Self-pay | Admitting: Physical Therapy

## 2020-04-25 ENCOUNTER — Other Ambulatory Visit: Payer: Self-pay

## 2020-04-25 ENCOUNTER — Ambulatory Visit: Payer: Medicare Other | Admitting: Physical Therapy

## 2020-04-25 DIAGNOSIS — M6281 Muscle weakness (generalized): Secondary | ICD-10-CM

## 2020-04-25 DIAGNOSIS — R293 Abnormal posture: Secondary | ICD-10-CM

## 2020-04-25 DIAGNOSIS — M25511 Pain in right shoulder: Secondary | ICD-10-CM

## 2020-04-25 NOTE — Therapy (Signed)
[facility] [address]  [place], [place], [address] Phone: [phone]   Fax:  [phone]  Physical Therapy Treatment  Patient Details  Name: Lori Goodwin MRN: [id number] Date of Birth: 1946/04/06 Referring Provider (PT): [name], DO   Encounter Date: 04/25/2020   PT End of Session - 04/25/20 0804    Visit Number 4    Number of Visits 12    Date for PT Re-Evaluation 05/01/20    Authorization Type Medicare + BCBS    PT Start Time 0804    PT Stop Time 0850    PT Time Calculation (min) 46 min    Activity Tolerance Patient tolerated treatment well    Behavior During Therapy [facility] for tasks assessed/performed           Past Medical History:  Diagnosis Date  . Allergic rhinitis   . Common migraine   . Hypertension     Past Surgical History:  Procedure Laterality Date  . CARPAL TUNNEL RELEASE Left 01/05/2016  . CARPAL TUNNEL RELEASE Right 04/2018  . TYMPANOSTOMY TUBE PLACEMENT      There were no vitals filed for this visit.   Subjective Assessment - 04/25/20 0807    Subjective Pt reports she can see some improvement, esp from massage, but still notes her shoulder will hurt when she lays down to go to sleep at night.    Diagnostic tests 03/06/20 - R shoulder x-ray: Mild degenerative changes but no acute bony findings.  03/06/20 - Limited ultrasound of patient's right shoulder shows there is significant arthritic changes especially of the acromioclavicular joint and moderate of the glenohumeral joint.  Patient does have degenerative tearing of the rotator cuff noted.  Appears to be chronic does have a reactive bursitis noted.  Hypoechoic changes within the bicep tendon sheath noted  Impression: Rotator cuff arthropathy    Patient Stated Goals "I want to be comfortable doing everything I do w/o pain"    Currently in Pain? No/denies    Pain Onset More than a month ago   ~2 months                             [facility] Adult PT Treatment/Exercise - 04/25/20 1318      Exercises   Exercises Shoulder      Shoulder Exercises: Supine   Protraction Right;10 reps;Strengthening;Weights    Protraction Weight (lbs) 3      Shoulder Exercises: Sidelying   External Rotation Right;10 reps    External Rotation Limitations to neutral + scap retraction    ABduction Right;10 reps;AROM   2 sets   ABduction Limitations 0-90 x 10; full range x 10; cues for humeral head depression    Other Sidelying Exercises R horiz ABD to neutral 5 x 3"; R open book stretch 10 x 3"    Other Sidelying Exercises R shoulder CW/CCW circles at 90 ABD 2 x 10      Shoulder Exercises: Standing   Internal Rotation Right;5 reps;Strengthening;Theraband    Internal Rotation Limitations isometric step-outs    Row Both;10 reps;Strengthening;Theraband    Theraband Level (Shoulder Row) Level 2 (Red)    Row Limitations cues for scap retraction & depression, tucking elbows in toward ribs - slightly decreased retraction on R but improved after tactile cueing    Retraction Both;10 reps;Strengthening;Theraband    Theraband Level (Shoulder Retraction) Level 2 (Red)  Retraction Limitations + mini-shoulder extensions      Shoulder Exercises: ROM/Strengthening   UBE (Upper Arm Bike) L2.0 x 6 min (3' fwd/3' back)      Manual Therapy   Manual Therapy Soft tissue mobilization;Myofascial release    Soft tissue mobilization STM/DTM to R teres group & subscapularis    Myofascial Release manual TPR to R teres group & subscapularis                    PT Short Term Goals - 04/15/20 0810      PT SHORT TERM GOAL #1   Title Patient will be independent with initial HEP    Status Achieved   04/15/20            PT Long Term Goals - 04/15/20 0810      PT LONG TERM GOAL #1   Title Patient will be independent with ongoing/advanced HEP for self-management at home    Status On-going    Target Date  05/01/20      PT LONG TERM GOAL #2   Title Improve posture and alignment with patient to demonstrate improved upright posture with posterior shoulder girdle engaged    Status On-going    Target Date 05/01/20      PT LONG TERM GOAL #3   Title Patient will demonstrate improved R shoulder strength to >/= 4 to 4+/5 for functional UE use    Status On-going    Target Date 05/01/20      PT LONG TERM GOAL #4   Title Patient to report ability to perform ADLs, household, and work-related tasks without limitation due to R shoulder pain, LOM or weakness    Status On-going    Target Date 05/01/20                 Plan - 04/25/20 0809    Clinical Impression Statement Lori Goodwin reports her pain seems to be improving, with good relief noted from MT (although painful at the time). Continued tightness noted in posterior shoulder complex which was addressed with further MT today with some relief noted. Progressed therapeutic exercises with introduction of AROM/strengthening in supine and sidelying as well as HEP progression including increased theraband resistance for rows/retractions and progression of isometrics to include yellow TB step-outs. Good tolerance for all exercises with pt requesting updated HEP instructions for some of exercises.    Comorbidities B carpal tunnel release, HTN, lumbar DJD, R knee pain, midfoot OA, L anterior tibialis pain/tendinitis, allergies    Rehab Potential Excellent    PT Frequency 2x / week   hopefully tapering to 1x/wk   PT Duration 6 weeks    PT Treatment/Interventions ADLs/Self Care Home Management;Cryotherapy;Electrical Stimulation;Iontophoresis 4mg /ml Dexamethasone;Moist Heat;Ultrasound;Functional mobility training;Therapeutic activities;Therapeutic exercise;Neuromuscular re-education;Patient/family education;Manual techniques;Passive range of motion;Dry needling;Taping;Vasopneumatic Device;Joint Manipulations    PT Next Visit Plan progress postural and RTC  strengthening; manual therapy and modalities PRN    Consulted and Agree with Plan of Care Patient           Patient will benefit from skilled therapeutic intervention in order to improve the following deficits and impairments:     Visit Diagnosis: Acute pain of right shoulder  Abnormal posture  Muscle weakness (generalized)     Problem List Patient Active Problem List   Diagnosis Date Noted  . Rotator cuff arthropathy of right shoulder 03/06/2020  . Lipoma of lower extremity 01/03/2020  . Patellofemoral arthritis of right knee 11/16/2019  . Acute left lumbar radiculopathy  09/14/2019  . Left tibialis posterior tendinitis 06/29/2019  . Hyperglycemia 10/05/2018  . Hypertension 05/10/2018  . Abnormality of gait 06/30/2016  . Arthritis of midfoot 06/02/2016  . PCP NOTES >>>> 03/18/2015  . CTS (carpal tunnel syndrome) 08/01/2014  . Eczema, ear canal 05/15/2014  . Eustachian tube dysfunction 04/08/2014  . Annual physical exam 02/19/2014  . DJD (degenerative joint disease) 01/15/2012  . Fatigue 10/31/2010  . Menopause 10/31/2010  . ALLERGIC RHINITIS 01/07/2007  . COMMON MIGRAINE 12/15/2006    Percival Spanish, PT, MPT 04/25/2020, 2:25 PM  [facility] [address]  [place], [place], [address] Phone: [phone]   Fax:  [phone]  Name: Lori Goodwin MRN: [id number] Date of Birth: 07-28-1945

## 2020-04-29 ENCOUNTER — Other Ambulatory Visit: Payer: Self-pay

## 2020-04-29 ENCOUNTER — Encounter: Payer: Self-pay | Admitting: Physical Therapy

## 2020-04-29 ENCOUNTER — Ambulatory Visit: Payer: Medicare Other | Admitting: Physical Therapy

## 2020-04-29 DIAGNOSIS — M25511 Pain in right shoulder: Secondary | ICD-10-CM

## 2020-04-29 DIAGNOSIS — R293 Abnormal posture: Secondary | ICD-10-CM | POA: Diagnosis not present

## 2020-04-29 DIAGNOSIS — M6281 Muscle weakness (generalized): Secondary | ICD-10-CM

## 2020-04-29 NOTE — Therapy (Addendum)
[facility] [address]  [place], [place], [address] Phone: [phone]   Fax:  [phone]  Physical Therapy Treatment / Progress Note / Recert / Discharge Summary  Patient Details  Name: Lori Goodwin MRN: [id number] Date of Birth: Jan 03, 1946 Referring Provider (PT): [name], DO  Progress Note  Reporting Period 03/20/2020 to 04/29/2020  See note below for Objective Data and Assessment of Progress/Goals.      Encounter Date: 04/29/2020   PT End of Session - 04/29/20 0808    Visit Number 5    Number of Visits 13    Date for PT Re-Evaluation 05/27/20    Authorization Type Medicare + BCBS    Progress Note Due on Visit 18   Recert completed on visit #5 - 04/29/20   PT Start Time 0808   Pt arrrived late   PT Stop Time 0846    PT Time Calculation (min) 38 min    Activity Tolerance Patient tolerated treatment well    Behavior During Therapy [facility] for tasks assessed/performed           Past Medical History:  Diagnosis Date  . Allergic rhinitis   . Common migraine   . Hypertension     Past Surgical History:  Procedure Laterality Date  . CARPAL TUNNEL RELEASE Left 01/05/2016  . CARPAL TUNNEL RELEASE Right 04/2018  . TYMPANOSTOMY TUBE PLACEMENT      There were no vitals filed for this visit.   Subjective Assessment - 04/29/20 0810    Subjective Pt noting some difficulty with isometric step-outs using red TB but reminded pt that was to be the yellow TB. No pain currently but she still notes some pain at night, although better after trying suggested positioning with pillows and does not interfere with her sleep any longer.    Diagnostic tests 03/06/20 - R shoulder x-ray: Mild degenerative changes but no acute bony findings.  03/06/20 - Limited ultrasound of patient's right shoulder shows there is significant arthritic changes especially of the acromioclavicular joint and moderate of the glenohumeral  joint.  Patient does have degenerative tearing of the rotator cuff noted.  Appears to be chronic does have a reactive bursitis noted.  Hypoechoic changes within the bicep tendon sheath noted  Impression: Rotator cuff arthropathy    Patient Stated Goals "I want to be comfortable doing everything I do w/o pain"    Currently in Pain? No/denies    Pain Score 0-No pain   up to 7/10 at night   Pain Location Shoulder    Pain Orientation Right    Pain Type Acute pain    Pain Onset More than a month ago   ~2 months             [facility] PT Assessment - 04/29/20 0808      Assessment   Medical Diagnosis R shoulder arthropathy    Referring Provider (PT) [name], DO    Hand Dominance Right    Next MD Visit 05/01/20      AROM   Right Shoulder Flexion 161 Degrees    Right Shoulder ABduction 160 Degrees    Right Shoulder Internal Rotation --   FIR to T10   Right Shoulder External Rotation --   FER to T3     Strength   Right Shoulder Flexion 4-/5   pain with resistance   Right Shoulder Extension 4+/5    Right Shoulder ABduction 4-/5  pain   Right Shoulder Internal Rotation 4/5    Right Shoulder External Rotation 3+/5   unable to take much resistance d/t pain   Left Shoulder Flexion 4/5    Left Shoulder Extension 4+/5    Left Shoulder ABduction 4/5    Left Shoulder Internal Rotation 4+/5    Left Shoulder External Rotation 4/5                         [facility] Adult PT Treatment/Exercise - 04/29/20 0808      Exercises   Exercises Shoulder      Shoulder Exercises: Standing   External Rotation Right;5 reps;Strengthening;Theraband    Theraband Level (Shoulder External Rotation) Level 1 (Yellow)    External Rotation Limitations isometric step-outs    Internal Rotation Right;10 reps;Strengthening;Theraband;AROM    Theraband Level (Shoulder Internal Rotation) Level 1 (Yellow)    Internal Rotation Limitations isometric step-outs & AROM with yellow TB    Other Standing  Exercises B yellow TB shawl crossed behind back - retraction & deperession 10 x 5"      Shoulder Exercises: ROM/Strengthening   UBE (Upper Arm Bike) L2.5 x 6 min (3' fwd/3' back)                    PT Short Term Goals - 04/15/20 0810      PT SHORT TERM GOAL #1   Title Patient will be independent with initial HEP    Status Achieved   04/15/20            PT Long Term Goals - 04/29/20 0813      PT LONG TERM GOAL #1   Title Patient will be independent with ongoing/advanced HEP for self-management at home    Status Partially Met    Target Date 05/27/20      PT LONG TERM GOAL #2   Title Improve posture and alignment with patient to demonstrate improved upright posture with posterior shoulder girdle engaged    Status Partially Met    Target Date 05/27/20      PT LONG TERM GOAL #3   Title Patient will demonstrate improved R shoulder strength to >/= 4 to 4+/5 for functional UE use    Status Partially Met    Target Date 05/27/20      PT LONG TERM GOAL #4   Title Patient to report ability to perform ADLs, household, and work-related tasks without limitation due to R shoulder pain, LOM or weakness    Status Partially Met   04/29/20 - Notes some pain when reaching away from her body to the side at times but it does not prevent her from doing things.   Target Date 05/27/20                 Plan - 04/29/20 0814    Clinical Impression Statement Lori Goodwin reports 50% improvement in pain since start of PT. Currently notes pain mostly at night or when reaching into scaption or abduction but states pain no longer interferes with sleep and does not prevent her from completing any daily tasks. R shoulder ROM has improved along with R shoulder strength although mild weakness still noted in most motions with pain upon resisted flexion, abduction and ER. Lori Goodwin has met her STG and has shown good progress toward LTGs with all LTGs now partially met. Lori Goodwin missed several visits in POC  due to quarantine/isolation for COVID as well as inclement weather, therefore will  recommend recert for extension of POC for 2x/wk for up to 4 wks.    Comorbidities B carpal tunnel release, HTN, lumbar DJD, R knee pain, midfoot OA, L anterior tibialis pain/tendinitis, allergies    Rehab Potential Excellent    PT Frequency 2x / week   hopefully tapering to 1x/wk   PT Duration 4 weeks    PT Treatment/Interventions ADLs/Self Care Home Management;Cryotherapy;Electrical Stimulation;Iontophoresis 60m/ml Dexamethasone;Moist Heat;Ultrasound;Functional mobility training;Therapeutic activities;Therapeutic exercise;Neuromuscular re-education;Patient/family education;Manual techniques;Passive range of motion;Dry needling;Taping;Vasopneumatic Device;Joint Manipulations    PT Next Visit Plan progress postural and RTC strengthening; manual therapy and modalities PRN    Consulted and Agree with Plan of Care Patient           Patient will benefit from skilled therapeutic intervention in order to improve the following deficits and impairments:     Visit Diagnosis: Acute pain of right shoulder  Abnormal posture  Muscle weakness (generalized)     Problem List Patient Active Problem List   Diagnosis Date Noted  . Rotator cuff arthropathy of right shoulder 03/06/2020  . Lipoma of lower extremity 01/03/2020  . Patellofemoral arthritis of right knee 11/16/2019  . Acute left lumbar radiculopathy 09/14/2019  . Left tibialis posterior tendinitis 06/29/2019  . Hyperglycemia 10/05/2018  . Hypertension 05/10/2018  . Abnormality of gait 06/30/2016  . Arthritis of midfoot 06/02/2016  . PCP NOTES >>>> 03/18/2015  . CTS (carpal tunnel syndrome) 08/01/2014  . Eczema, ear canal 05/15/2014  . Eustachian tube dysfunction 04/08/2014  . Annual physical exam 02/19/2014  . DJD (degenerative joint disease) 01/15/2012  . Fatigue 10/31/2010  . Menopause 10/31/2010  . ALLERGIC RHINITIS 01/07/2007  . COMMON MIGRAINE  12/15/2006    JPercival Spanish PT, MPT 04/29/2020, 12:27 PM  [facility] [place] [address]Phone: [phone]  Fax:  [phone] Name: TMIKALAH SKYLESMRN: [id number] of Birth: 519-Aug-1947   PHYSICAL THERAPY DISCHARGE SUMMARY  Visits from Start of Care: 5  Current functional level related to goals / functional outcomes:   Refer to above clinical impression for status as of last visit on 04/29/2020. Patient was placed on hold for 30 days per MD request pending MRI of her shoulder. Noted MRI results indicating need for surgical consult, therefore will proceed with discharge from PT for this episode.   Remaining deficits:   As above. MRI results reviewed and referral to orthopedic surgeon noted.   Education / Equipment:   HEP  Plan: Patient agrees to discharge.  Patient goals were partially met. Patient is being discharged due to the physician's request.  ?????     JPercival Spanish PT, MPT 06/17/20, 9:01 AM  [facility] [place] [address]Phone: [phone]  Fax:  [phone]

## 2020-05-01 ENCOUNTER — Ambulatory Visit (INDEPENDENT_AMBULATORY_CARE_PROVIDER_SITE_OTHER): Payer: Medicare Other | Admitting: Family Medicine

## 2020-05-01 ENCOUNTER — Other Ambulatory Visit: Payer: Self-pay

## 2020-05-01 ENCOUNTER — Ambulatory Visit: Payer: Self-pay

## 2020-05-01 ENCOUNTER — Encounter: Payer: Self-pay | Admitting: Family Medicine

## 2020-05-01 VITALS — BP 108/74 | HR 57 | Ht 63.0 in | Wt 151.0 lb

## 2020-05-01 DIAGNOSIS — M25511 Pain in right shoulder: Secondary | ICD-10-CM | POA: Diagnosis not present

## 2020-05-01 DIAGNOSIS — G8929 Other chronic pain: Secondary | ICD-10-CM

## 2020-05-01 DIAGNOSIS — M12811 Other specific arthropathies, not elsewhere classified, right shoulder: Secondary | ICD-10-CM

## 2020-05-01 NOTE — Patient Instructions (Addendum)
MRI right shoulder [facility] [phone] I will contact you once I have results

## 2020-05-01 NOTE — Progress Notes (Signed)
[facility] [place] [place] Phone: [phone] Subjective:   Lori Goodwin, am serving as a scribe for Dr. Hulan Saas. This visit occurred during the SARS-CoV-2 public health emergency.  Safety protocols were in place, including screening questions prior to the visit, additional usage of staff PPE, and extensive cleaning of exam room while observing appropriate contact time as indicated for disinfecting solutions.   I'm seeing this patient by the request  of:  Colon Branch, MD  CC: Right shoulder pain follow-up  AYT:[id number]   03/06/2020 Patient has what appears to be arthritic changes with likely rotator cuff arthropathy.  Patient's wants to start formal physical therapy.  Does not remember any acute injury and I think that patient could do well with conservative therapy.  X-rays are pending, refer to formal physical therapy.  Patient will follow up again in 6 to 8 weeks.  Continue to have difficulty we will consider the possibility of this imaging versus injection.  Update 05/01/2020 Lori Goodwin is a 75 y.o. female coming in with complaint of right shoulder pain. Patient has been going to PT. Patient states that she is having slight improvement with physical therapy. Pain remains in anterior shoulder and sometime radiates down in the upper arm. Cleaned son's house the other day and this caused pain in right shoulder. Uses Pennsaid for pain relief.  Patient states that usually with daily activities she does okay but anything more than daily activities and then at night she always has some discomfort.    Patient's x-rays of the right shoulder show the patient does have mild to moderate before meals and glenohumeral degenerative changes.  Patient's ultrasound on the same date of March 16, 2020 showed more moderate to severe arthritic changes as well as degenerative tearing of the rotator cuff.  Past Medical History:  Diagnosis  Date  . Allergic rhinitis   . Common migraine   . Hypertension    Past Surgical History:  Procedure Laterality Date  . CARPAL TUNNEL RELEASE Left 01/05/2016  . CARPAL TUNNEL RELEASE Right 04/2018  . TYMPANOSTOMY TUBE PLACEMENT     Social History   Socioeconomic History  . Marital status: Married    Spouse name: Not on file  . Number of children: 3  . Years of education: Not on file  . Highest education level: Not on file  Occupational History  . Occupation: [name], 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    Employer: [employer]  Tobacco Use  . Smoking status: Never Smoker  . Smokeless tobacco: Never Used  Substance and Sexual Activity  . Alcohol use: Goodwin  . Drug use: Goodwin  . Sexual activity: Not on file  Other Topics Concern  . Not on file  Social History Narrative   [place] from [place]   Divorced, remarried   Lives w/ husband   [name] her son lives in [place]     3 children ,1 son is paraplegic , has another that is a MD,other a PA     Social Determinants of Radio broadcast assistant Strain: Not on file  Food Insecurity: Not on file  Transportation Needs: Not on file  Physical Activity: Not on file  Stress: Not on file  Social Connections: Not on file   Allergies  Allergen Reactions  . Tylenol [Acetaminophen] Shortness Of Breath   Family History  Problem Relation Age of Onset  . Colon cancer Neg Hx   . Breast cancer Neg  Hx   . Diabetes Neg Hx   . Heart attack Neg Hx      Current Outpatient Medications (Cardiovascular):  .  amLODipine (NORVASC) 5 MG tablet, Take 1 tablet (5 mg total) by mouth daily. .  propranolol ER (INDERAL LA) 60 MG 24 hr capsule, Take 1 capsule (60 mg total) by mouth daily.   Current Outpatient Medications (Analgesics):  [name]  SUMAtriptan (IMITREX) 50 MG tablet, TAKE 1-2 TABLETS BY MOUTH AS NEEDED FOR MIGRAINE. MAY REPEAT IN 2 HOURS IF HEADACHE PERSISTS OR RECURS   Current Outpatient Medications (Other):  [name]  Cholecalciferol (VITAMIN D)  2000 units CAPS, Take 1 capsule by mouth. .  clobetasol (TEMOVATE) 0.05 % external solution, Apply topically daily as needed. .  Multiple Vitamin (MULTIVITAMIN WITH MINERALS) TABS, Take 1 tablet by mouth daily.   Reviewed prior external information including notes and imaging from  primary care provider As well as notes that were available from care everywhere and other healthcare systems.  Past medical history, social, surgical and family history all reviewed in electronic medical record.  Goodwin pertanent information unless stated regarding to the chief complaint.   Review of Systems:  Goodwin headache, visual changes, nausea, vomiting, diarrhea, constipation, dizziness, abdominal pain, skin rash, fevers, chills, night sweats, weight loss, swollen lymph nodes, body aches, joint swelling, chest pain, shortness of breath, mood changes. POSITIVE muscle aches  Objective  Blood pressure 108/74, pulse (!) 57, height 5\' 3"  (1.6 m), weight 151 lb (68.5 kg), SpO2 98 %.   General: Goodwin apparent distress alert and oriented x3 mood and affect normal, dressed appropriately.  HEENT: Pupils equal, extraocular movements intact  Respiratory: Patient's speak in full sentences and does not appear short of breath  Cardiovascular: Goodwin lower extremity edema, non tender, Goodwin erythema  Gait normal with good balance and coordination.  MSK:   Right shoulder exam shows the patient does have positive impingement.  Patient actually does have weakness now of the rotator cuff that is different than previous exam with 4 out of 5 strength.  Severely positive O'Brien's test noted today with weakness as well.  Patient does have though good range of motion of the shoulder.  Good strength with internal and external range of motion of the shoulder.  Limited musculoskeletal ultrasound was performed and interpreted by [name]  Limited ultrasound shows that some of the calcific changes that was noted previously on the ultrasound has  resolved.  Patient though does have what appears to be a near full-thickness tear potentially of the supraspinatus but also difficult to tell secondary to the hypoechoic changes if the hypoechoic changes are not clear it does have 0.5 cm of retraction.  Significant hypoechoic changes of the tendon sheath of the bicep especially near the insertion of the labrum. Impression: Questionable rotator cuff tear and possible labral pathology    Impression and Recommendations:     The above documentation has been reviewed and is accurate and complete [name], DO

## 2020-05-01 NOTE — Assessment & Plan Note (Signed)
Patient still has signs and symptoms that is consistent with a rotator cuff arthropathy.  Patient does have degenerative changes noted and patient does have some tearing noted on ultrasound today of the rotator cuff.  There is a possibility though that I feel the patient should have advanced imaging to see if any surgical intervention could be beneficial.  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  Depending on imaging we will discuss further treatment options.

## 2020-05-02 ENCOUNTER — Other Ambulatory Visit: Payer: Self-pay | Admitting: Internal Medicine

## 2020-05-02 ENCOUNTER — Encounter: Payer: Medicare Other | Admitting: Physical Therapy

## 2020-05-09 ENCOUNTER — Ambulatory Visit: Payer: Medicare Other | Admitting: Physical Therapy

## 2020-05-13 ENCOUNTER — Encounter: Payer: BC Managed Care – PPO | Admitting: Physical Therapy

## 2020-05-15 ENCOUNTER — Encounter: Payer: BC Managed Care – PPO | Admitting: Physical Therapy

## 2020-05-16 ENCOUNTER — Encounter: Payer: Self-pay | Admitting: Gastroenterology

## 2020-05-20 ENCOUNTER — Encounter: Payer: BC Managed Care – PPO | Admitting: Physical Therapy

## 2020-05-22 ENCOUNTER — Encounter: Payer: BC Managed Care – PPO | Admitting: Physical Therapy

## 2020-05-22 DIAGNOSIS — Z1231 Encounter for screening mammogram for malignant neoplasm of breast: Secondary | ICD-10-CM | POA: Diagnosis not present

## 2020-05-22 DIAGNOSIS — Z7689 Persons encountering health services in other specified circumstances: Secondary | ICD-10-CM | POA: Diagnosis not present

## 2020-05-22 DIAGNOSIS — Z01419 Encounter for gynecological examination (general) (routine) without abnormal findings: Secondary | ICD-10-CM | POA: Diagnosis not present

## 2020-05-22 LAB — HM MAMMOGRAPHY

## 2020-05-27 ENCOUNTER — Other Ambulatory Visit: Payer: Self-pay

## 2020-05-27 ENCOUNTER — Encounter: Payer: Self-pay | Admitting: Internal Medicine

## 2020-05-27 ENCOUNTER — Encounter: Payer: BC Managed Care – PPO | Admitting: Physical Therapy

## 2020-05-27 ENCOUNTER — Ambulatory Visit
Admission: RE | Admit: 2020-05-27 | Discharge: 2020-05-27 | Disposition: A | Discharge status: Home / Self Care | Payer: BC Managed Care – PPO | Source: Ambulatory Visit | Attending: Family Medicine | Admitting: Family Medicine

## 2020-05-27 DIAGNOSIS — M75121 Complete rotator cuff tear or rupture of right shoulder, not specified as traumatic: Secondary | ICD-10-CM | POA: Diagnosis not present

## 2020-05-27 DIAGNOSIS — G8929 Other chronic pain: Secondary | ICD-10-CM

## 2020-05-27 DIAGNOSIS — M25511 Pain in right shoulder: Secondary | ICD-10-CM | POA: Diagnosis not present

## 2020-05-27 MED ORDER — IOPAMIDOL (ISOVUE-M 200) INJECTION 41%
15.0000 mL | Freq: Once | INTRAMUSCULAR | Status: AC
  Administered 2020-05-27: 15 mL via INTRA_ARTICULAR

## 2020-05-29 ENCOUNTER — Encounter: Payer: Self-pay | Admitting: Family Medicine

## 2020-05-29 ENCOUNTER — Other Ambulatory Visit: Payer: Self-pay | Admitting: Internal Medicine

## 2020-05-30 ENCOUNTER — Encounter: Payer: BC Managed Care – PPO | Admitting: Physical Therapy

## 2020-05-30 ENCOUNTER — Other Ambulatory Visit: Payer: Self-pay

## 2020-05-30 DIAGNOSIS — M25519 Pain in unspecified shoulder: Secondary | ICD-10-CM

## 2020-06-13 ENCOUNTER — Encounter: Payer: Self-pay | Admitting: Internal Medicine

## 2020-06-21 DIAGNOSIS — M75121 Complete rotator cuff tear or rupture of right shoulder, not specified as traumatic: Secondary | ICD-10-CM | POA: Diagnosis not present

## 2020-06-24 ENCOUNTER — Other Ambulatory Visit: Payer: Self-pay

## 2020-06-24 DIAGNOSIS — M12811 Other specific arthropathies, not elsewhere classified, right shoulder: Secondary | ICD-10-CM

## 2020-06-28 ENCOUNTER — Other Ambulatory Visit: Payer: Self-pay | Admitting: Internal Medicine

## 2020-07-03 ENCOUNTER — Other Ambulatory Visit: Payer: Self-pay

## 2020-07-03 ENCOUNTER — Ambulatory Visit: Payer: Medicare Other | Attending: Family Medicine | Admitting: Physical Therapy

## 2020-07-03 ENCOUNTER — Encounter: Payer: Self-pay | Admitting: Physical Therapy

## 2020-07-03 DIAGNOSIS — M25511 Pain in right shoulder: Secondary | ICD-10-CM | POA: Diagnosis not present

## 2020-07-03 DIAGNOSIS — M62838 Other muscle spasm: Secondary | ICD-10-CM | POA: Insufficient documentation

## 2020-07-03 DIAGNOSIS — R293 Abnormal posture: Secondary | ICD-10-CM | POA: Diagnosis not present

## 2020-07-03 DIAGNOSIS — M6281 Muscle weakness (generalized): Secondary | ICD-10-CM | POA: Insufficient documentation

## 2020-07-03 NOTE — Patient Instructions (Signed)
     Access Code: [email or web address] URL: [email or web address] Date: 07/03/2020 Prepared by: Annie Paras  Exercises Seated Gentle Upper Trapezius Stretch - 2 x daily - 7 x weekly - 2 reps - 30 sec hold Seated Scapular Retraction - 2 x daily - 7 x weekly - 2 sets - 10 reps - 3 sec hold Doorway Pec Stretch at 60 Degrees Abduction with Arm Straight - 2 x daily - 7 x weekly - 2 reps - 30 sec hold Doorway Pec Stretch at 90 Degrees Abduction - 2 x daily - 7 x weekly - 2 reps - 30 sec hold Doorway Pec Stretch at 120 Degrees Abduction - 2 x daily - 7 x weekly - 2 reps - 30 sec hold

## 2020-07-03 NOTE — Therapy (Signed)
[facility] [address]  [place], [place], [address] Phone: [phone]   Fax:  [phone]  Physical Therapy Evaluation  Patient Details  Name: Lori Goodwin MRN: [id number] Date of Birth: 05/03/1945 Referring Provider (PT): [name], DO   Encounter Date: 07/03/2020   PT End of Session - 07/03/20 0953    Visit Number 1    Number of Visits 12    Date for PT Re-Evaluation 08/14/20    Authorization Type Medicare + BCBS    Progress Note Due on Visit 10    PT Start Time 0852    PT Stop Time 0941    PT Time Calculation (min) 49 min    Activity Tolerance Patient tolerated treatment well    Behavior During Therapy [facility] for tasks assessed/performed           Past Medical History:  Diagnosis Date  . Allergic rhinitis   . Common migraine   . Hypertension     Past Surgical History:  Procedure Laterality Date  . CARPAL TUNNEL RELEASE Left 01/05/2016  . CARPAL TUNNEL RELEASE Right 04/2018  . TYMPANOSTOMY TUBE PLACEMENT      There were no vitals filed for this visit.    Subjective Assessment - 07/03/20 0853    Subjective Pt. reports R shoulder pain with lifting and overhead activities but no pain at rest. She does not want surgery and hope PT can ease her pain so she can perform activities such as gardening.    Limitations Lifting    Diagnostic tests R shoulder MRI 05/27/20: 1. Near complete full-thickness tear of the supraspinatus tendon with 2.6 cm of retraction of fibers. There is resultant high-riding humeral head and fluid in the subacromial-subdeltoid bursa.  2. Focal full-thickness tear of the superior subscapularis tendon.  3. Moderate subscapularis and infraspinatus tendinosis.  4. Mild-to-moderate glenohumeral joint chondral disease and a nondisplaced tear with degeneration of the posterosuperior labrum.    Patient Stated Goals Wants to avoid surgery; wants to be free of pain and be able to  garden and do activities without this pain    Currently in Pain? No/denies    Pain Score 0-No pain    Pain Location Shoulder    Pain Orientation Right    Pain Descriptors / Indicators Aching    Aggravating Factors  Lifting and putting things on the shelf; moving overhead, hurts with  sleeping positions when lying on right shoulder - keeps arm on pillow   4/10 when feels pain with these movements   Pain Relieving Factors Ointment on medication list; only does it when really needs it, hasn't done it in past 3-4 days, only in emergency    Effect of Pain on Daily Activities She is able to do her ADL's but with pain              [facility] PT Assessment - 07/03/20 0858      Assessment   Medical Diagnosis Rotator cuff arthropathy of right shoulder    Referring Provider (PT) [name], DO    Onset Date/Surgical Date --   5-6 months   Hand Dominance Right    Next MD Visit --   PRN   Prior Therapy PT episodes for R knee pain, LBP & L LE radiculopathy June 2021 and previous episode for shoulder this year      Precautions   Precautions None      Restrictions   Weight Bearing  Restrictions No      Balance Screen   Has the patient fallen in the past 6 months No    Has the patient had a decrease in activity level because of a fear of falling?  No    Is the patient reluctant to leave their home because of a fear of falling?  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    [employer], walking every day 40-45 minutes      Cognition   Overall Cognitive Status Within Functional Limits for tasks assessed      Observation/Other Assessments   Focus on Therapeutic Outcomes (FOTO)  R Shoulder Current is  59 and Predicted is 67      Posture/Postural Control   Posture/Postural Control Postural limitations    Postural Limitations Forward head;Rounded Shoulders      AROM   AROM Assessment Site Shoulder    Right Shoulder Flexion 152 Degrees    Right Shoulder ABduction 147 Degrees   feels a stretch R>L   Right Shoulder Internal Rotation 80 Degrees    Right Shoulder External Rotation 47 Degrees   tightness   Left Shoulder Flexion 155 Degrees    Left Shoulder ABduction 152 Degrees    Left Shoulder Internal Rotation 81 Degrees    Left Shoulder External Rotation 72 Degrees      Strength   Right Shoulder Flexion 3+/5    Right Shoulder ABduction 3+/5    Right Shoulder Internal Rotation 4/5    Right Shoulder External Rotation 4/5    Left Shoulder Flexion 4+/5    Left Shoulder ABduction 4+/5    Left Shoulder Internal Rotation 4+/5    Left Shoulder External Rotation 4+/5      Palpation   Palpation comment Tight B UT, LS, Rhomboids                      Objective measurements completed on examination: See above findings.       [facility] PT Treatment/Exercise - 07/03/20 0858      Shoulder Exercises: Seated   Retraction Both;10 reps;AROM;Strengthening   3 second holds     Shoulder Exercises: Stretch   Corner Stretch 1 rep;30 seconds    Corner Stretch Limitations 3-way doorway pec stretch    Other Shoulder Stretches B UT Stretch; 1 rep 30 seconds      Manual Therapy   Manual Therapy Soft tissue mobilization    Soft tissue mobilization STM/DTM to B UT and LS                  PT Education - 07/03/20 [id number]    Education Details HEP education; Geophysical data processor provided on initial POC    Person(s) Educated Patient    Methods Explanation;Demonstration;Tactile cues;Verbal cues;Handout    Comprehension Verbalized understanding;Returned demonstration;Verbal cues required;Tactile cues required            PT Short Term Goals - 07/03/20 1009      PT SHORT TERM GOAL #1    Title Patient will be independent with initial HEP    Status New    Target Date  07/24/20             PT Long Term Goals - 07/03/20 1010      PT LONG TERM GOAL #1   Title Patient will be independent with ongoing/advanced HEP for self-management at home    Status New    Target Date 08/14/20      PT LONG TERM GOAL #2   Title Improve posture and alignment with patient to demonstrate improved FHP and Rounded shoulders    Status New    Target Date 08/14/20      PT LONG TERM GOAL #3   Title Patient will demonstrate improved R shoulder strength to >/= 4+/5 for functional UE use    Status New    Target Date 08/14/20      PT LONG TERM GOAL #4   Title Patient to report ability to perform gardening, ADL's and work-related tasks without limitation due to R shoulder pain    Status New    Target Date 08/14/20                  Plan - 07/03/20 0955    Clinical Impression Statement Lori Goodwin was referred back to outpatient PT for rotator cuff arthropathy of right shoulder. Pt. was seen earlier in 2022 for similar problems but the therapy was held because she was looking into possible surgical consultation. She has a torn R supraspinatus tendon. She reports she has no resting pain but has pain 4/10 with overhead activities, lifting, and changed sleeping position to avoid pain. She wants to avoid surgery and ease the pain so she can do functional activities she enjoys, such as gardening. She presents with mild limitations in R shoulder ABD and ER due to pain near end ranges. She also has mild strength deficits in her R shoulder. She presents with tightness in B UT and LS and with FHP and rounded shoulders. Pt. will benefit from skilled PT to improve posture, R shoulder ROM and strength, and decrease pain to return to functional ADL's.    Personal Factors and Comorbidities Comorbidity 3+;Time since onset of injury/illness/exacerbation;Past/Current Experience;Age    Comorbidities B carpal tunnel  release, HTN, lumbar DJD, R knee pain, midfoot OA, L anterior tibialis pain/tendinitis, allergies    Examination-Activity Limitations Reach Overhead;Lift;Carry    Examination-Participation Restrictions Meal Prep;Occupation;Cleaning;Yard Work    Stability/Clinical Decision Making Stable/Uncomplicated    Designer, jewellery Low    Rehab Potential Excellent    PT Frequency 2x / week    PT Duration 6 weeks    PT Treatment/Interventions ADLs/Self Care Home Management;Cryotherapy;Electrical Stimulation;Iontophoresis 4mg /ml Dexamethasone;Moist Heat;Ultrasound;Functional mobility training;Therapeutic activities;Therapeutic exercise;Neuromuscular re-education;Patient/family education;Manual techniques;Passive range of motion;Dry needling;Taping;Vasopneumatic Device;Joint Manipulations    PT Next Visit Plan Progress RC strengthening with isometrics; STM/DTM and modalities PRN; Progress scapular stabilization; Consider DN; P/AA/AROM for R Shoulder    PT Home Exercise Plan [email or web address]    Consulted and Agree with Plan of Care Patient           Patient will benefit from skilled therapeutic intervention in order to improve the following deficits and impairments:  Decreased activity tolerance,Decreased strength,Increased fascial restricitons,Increased muscle spasms,Impaired perceived functional ability,Impaired flexibility,Impaired UE functional use,Postural dysfunction,Improper body mechanics,Pain,Impaired tone  Visit Diagnosis: Acute pain of right shoulder  Abnormal posture  Muscle weakness (generalized)  Other muscle spasm     Problem List Patient Active Problem List   Diagnosis Date Noted  . Rotator cuff arthropathy of right shoulder 03/06/2020  . Lipoma of lower extremity 01/03/2020  .  Patellofemoral arthritis of right knee 11/16/2019  . Acute left lumbar radiculopathy 09/14/2019  . Left tibialis posterior tendinitis 06/29/2019  . Hyperglycemia 10/05/2018  . Hypertension 05/10/2018  .  Abnormality of gait 06/30/2016  . Arthritis of midfoot 06/02/2016  . PCP NOTES >>>> 03/18/2015  . CTS (carpal tunnel syndrome) 08/01/2014  . Eczema, ear canal 05/15/2014  . Eustachian tube dysfunction 04/08/2014  . Annual physical exam 02/19/2014  . DJD (degenerative joint disease) 01/15/2012  . Fatigue 10/31/2010  . Menopause 10/31/2010  . ALLERGIC RHINITIS 01/07/2007  . COMMON MIGRAINE 12/15/2006    Newman Nickels SPT 07/03/2020, 12:24 PM  [facility] [address]  [place], [place], [address] Phone: [phone]   Fax:  [phone]  Name: Lori Goodwin MRN: [id number] Date of Birth: [birth year]

## 2020-07-08 ENCOUNTER — Ambulatory Visit (INDEPENDENT_AMBULATORY_CARE_PROVIDER_SITE_OTHER): Payer: Medicare Other | Admitting: Internal Medicine

## 2020-07-08 ENCOUNTER — Encounter: Payer: Self-pay | Admitting: Internal Medicine

## 2020-07-08 ENCOUNTER — Other Ambulatory Visit: Payer: Self-pay

## 2020-07-08 VITALS — BP 138/84 | HR 49 | Temp 97.7°F | Resp 16 | Ht 63.0 in | Wt 161.0 lb

## 2020-07-08 DIAGNOSIS — I10 Essential (primary) hypertension: Secondary | ICD-10-CM

## 2020-07-08 DIAGNOSIS — M8580 Other specified disorders of bone density and structure, unspecified site: Secondary | ICD-10-CM | POA: Diagnosis not present

## 2020-07-08 DIAGNOSIS — Z0000 Encounter for general adult medical examination without abnormal findings: Secondary | ICD-10-CM

## 2020-07-08 LAB — BASIC METABOLIC PANEL
BUN: 13 mg/dL (ref 6–23)
CO2: 28 mEq/L (ref 19–32)
Calcium: 9.9 mg/dL (ref 8.4–10.5)
Chloride: 105 mEq/L (ref 96–112)
Creatinine, Ser: 0.66 mg/dL (ref 0.40–1.20)
GFR: 86.72 mL/min (ref >60.00)
Glucose, Bld: 100 mg/dL — ABNORMAL HIGH (ref 70–99)
Potassium: 4.3 mEq/L (ref 3.5–5.1)
Sodium: 140 mEq/L (ref 135–145)

## 2020-07-08 LAB — LIPID PANEL
Cholesterol: 180 mg/dL (ref 0–200)
HDL: 54.1 mg/dL (ref >39.00)
LDL Cholesterol: 105 mg/dL — ABNORMAL HIGH (ref 0–99)
NonHDL: 125.87
Total CHOL/HDL Ratio: 3
Triglycerides: 102 mg/dL (ref 0.0–149.0)
VLDL: 20.4 mg/dL (ref 0.0–40.0)

## 2020-07-08 LAB — TSH: TSH: 1.71 u[IU]/mL (ref 0.35–4.50)

## 2020-07-08 LAB — VITAMIN D 25 HYDROXY (VIT D DEFICIENCY, FRACTURES): VITD: 43.98 ng/mL (ref 30.00–100.00)

## 2020-07-08 MED ORDER — AMLODIPINE BESYLATE 5 MG PO TABS: 5.0000 mg | ORAL_TABLET | Freq: Every day | ORAL | 3 refills | Status: DC

## 2020-07-08 MED ORDER — PROPRANOLOL HCL ER 60 MG PO CP24: 60.0000 mg | ORAL_CAPSULE | Freq: Every day | ORAL | 3 refills | Status: DC

## 2020-07-08 NOTE — Patient Instructions (Addendum)
Check the  blood pressure regularly BP GOAL is between 110/65 and  135/85. If it is consistently higher or lower, let me know   GO TO THE LAB : Get the blood work     [place], [place] back for   a checkup in 6 months    "Living will", "[place] of attorney": Advanced care planning  (If you already have a living will or healthcare power of attorney, please bring the copy to be scanned in your chart.)  Advance care planning is a process that supports adults in  understanding and sharing their preferences regarding future medical care.   The patient's preferences are recorded in documents called Advance Directives.    Advanced directives are completed (and can be modified at any time) while the patient is in full mental capacity.   The documentation should be available at all times to the patient, the family and the healthcare providers.  Bring in a copy to be scanned in your chart is an excellent idea and is recommended   This legal documents direct treatment decision making and/or appoint a surrogate to make the decision if the patient is not capable to do so.    Advance directives can be documented in many types of formats,  documents have names such as:  Lliving will  Durable power of attorney for healthcare (healthcare proxy or healthcare power of attorney)  Combined directives  Physician orders for life-sustaining treatment    More information at:  [email or web address]

## 2020-07-08 NOTE — Progress Notes (Signed)
Subjective:    Patient ID: MATTY DEAMER, female    DOB: January 10, 1946, 75 y.o.   MRN: [id number]  DOS:  07/08/2020 Type of visit - description: CPX Since the last office visit he is doing well. No new concerns, migraines are well controlled, still having some shoulder issues     Review of Systems  Other than above, a 14 point review of systems is negative      Past Medical History:  Diagnosis Date  . Allergic rhinitis   . Common migraine   . Hypertension     Past Surgical History:  Procedure Laterality Date  . CARPAL TUNNEL RELEASE Left 01/05/2016  . CARPAL TUNNEL RELEASE Right 04/2018  . TYMPANOSTOMY TUBE PLACEMENT      Allergies as of 07/08/2020      Reactions   Tylenol [acetaminophen] Shortness Of Breath      Medication List       Accurate as of July 08, 2020  1:47 PM. If you have any questions, ask your nurse or doctor.        amLODipine 5 MG tablet Commonly known as: NORVASC Take 1 tablet (5 mg total) by mouth daily.   clobetasol 0.05 % external solution Commonly known as: TEMOVATE Apply topically daily as needed.   multivitamin with minerals Tabs tablet Take 1 tablet by mouth daily.   propranolol ER 60 MG 24 hr capsule Commonly known as: INDERAL LA Take 1 capsule (60 mg total) by mouth daily.   SUMAtriptan 50 MG tablet Commonly known as: IMITREX TAKE 1-2 TABLETS BY MOUTH AS NEEDED FOR MIGRAINE. MAY REPEAT IN 2 HOURS IF HEADACHE PERSISTS OR RECURS   Vitamin D 50 MCG (2000 UT) Caps Take 1 capsule by mouth.          Objective:   Physical Exam BP 138/84 (BP Location: Left Arm, Patient Position: Sitting, Cuff Size: Small)   Pulse (!) 49   Temp 97.7 F (36.5 C) (Oral)   Resp 16   Ht 5\' 3"  (1.6 m)   Wt 161 lb (73 kg)   SpO2 94%   BMI 28.52 kg/m  General: Well developed, NAD, BMI noted Neck: No  thyromegaly  HEENT:  Normocephalic . Face symmetric, atraumatic Lungs:  CTA B Normal respiratory effort, no intercostal retractions, no  accessory muscle use. Heart: RRR,  no murmur.  Abdomen:  Not distended, soft, non-tender. No rebound or rigidity.   Lower extremities: no pretibial edema bilaterally  Skin: Exposed areas without rash. Not pale. Not jaundice Neurologic:  alert & oriented X3.  Speech normal, gait appropriate for age and unassisted Strength symmetric and appropriate for age.  Psych: Cognition and judgment appear intact.  Cooperative with normal attention span and concentration.  Behavior appropriate. No anxious or depressed appearing.     Assessment     Assessment  Prediabetes: A1c 6.0 (03-2015) HTN (on BB) MSK: --DJD --Neck pain: Saw neurosurgery, MRI was done, DX DJD, nonsurgical candidate (2013 ) --s/p L & R CTS surgery  Osteopenia first  T score -1.2 (06/2016) rx ca and vit D  Migraines  (onn BB and imitrex) Ear canal eczema: topical steroids prn  PLAN: Here for CPX HTN: on BBs and amlodipine, BPs in the 130s, rarely 140s at home. RF, labs  Dyslipidemia: CV Risk 19%, qualify for statins, she has been reluctant to take them, doing better w/ diet-exercise, check labs Osteopenia: next DEXA 2023, on Vit D, check levels RTC 6 months    --Td 2021 -  PNM 23: 2015; Prevnar: 2016  -zostavax 03-2015; s/ shingrix x2 - s/p covid vax x 3  - had a  flu shot   --Female care:@ [facility] Dr Philis Pique, [place]  --QAS:[id number] 01-2015, + polyps, Dr Ardis Hughs, per GI letter next 01-2022 - diet, exercise: doing well - labs : bmp FLP TSH Vit D - ACP: discussed, has a H-POA, rec to bring a copy     This visit occurred during the SARS-CoV-2 public health emergency.  Safety protocols were in place, including screening questions prior to the visit, additional usage of staff PPE, and extensive cleaning of exam room while observing appropriate contact time as indicated for disinfecting solutions.

## 2020-07-08 NOTE — Assessment & Plan Note (Signed)
--  Td 2021 -PNM 23: 2015; Prevnar: 2016  -zostavax 03-2015; s/ shingrix x2 - s/p covid vax x 3  - had a  flu shot   --Female care:@ [facility] Dr Philis Pique, [place]  --GDJ:[id number] 01-2015, + polyps, Dr Ardis Hughs, per GI letter next 01-2022 - diet, exercise: doing well - labs : bmp FLP TSH Vit D - ACP: discussed, has a H-POA, rec to bring a copy

## 2020-07-08 NOTE — Assessment & Plan Note (Signed)
Here for CPX HTN: on BBs and amlodipine, BPs in the 130s, rarely 140s at home. RF, labs  Dyslipidemia: CV Risk 19%, qualify for statins, she has been reluctant to take them, doing better w/ diet-exercise, check labs Osteopenia: next DEXA 2023, on Vit D, check levels RTC 6 months

## 2020-07-12 ENCOUNTER — Other Ambulatory Visit: Payer: Self-pay

## 2020-07-12 ENCOUNTER — Ambulatory Visit: Payer: Medicare Other | Attending: Family Medicine

## 2020-07-12 DIAGNOSIS — M25511 Pain in right shoulder: Secondary | ICD-10-CM | POA: Diagnosis not present

## 2020-07-12 DIAGNOSIS — M62838 Other muscle spasm: Secondary | ICD-10-CM | POA: Diagnosis not present

## 2020-07-12 DIAGNOSIS — R293 Abnormal posture: Secondary | ICD-10-CM | POA: Insufficient documentation

## 2020-07-12 DIAGNOSIS — M6281 Muscle weakness (generalized): Secondary | ICD-10-CM | POA: Diagnosis not present

## 2020-07-12 NOTE — Patient Instructions (Signed)
Access Code: [email or web address] URL: [email or web address] Date: 07/12/2020 Prepared by: Kathreen Cornfield  Exercises Seated Gentle Upper Trapezius Stretch - 2 x daily - 7 x weekly - 2 reps - 30 sec hold Seated Scapular Retraction - 2 x daily - 7 x weekly - 2 sets - 10 reps - 3 sec hold Doorway Pec Stretch at 60 Degrees Abduction with Arm Straight - 2 x daily - 7 x weekly - 2 reps - 30 sec hold Doorway Pec Stretch at 90 Degrees Abduction - 2 x daily - 7 x weekly - 2 reps - 30 sec hold Doorway Pec Stretch at 120 Degrees Abduction - 2 x daily - 7 x weekly - 2 reps - 30 sec hold Sidelying Shoulder ER with Towel and Dumbbell - 1 x daily - 7 x weekly - 3 sets - 30 seconds hold

## 2020-07-12 NOTE — Therapy (Signed)
[facility] [address]  [place], [place], [address] Phone: [phone]   Fax:  [phone]  Physical Therapy Treatment  Patient Details  Name: Lori Goodwin MRN: [id number] Date of Birth: 04/16/1945 Referring Provider (PT): [name], DO   Encounter Date: 07/12/2020   PT End of Session - 07/12/20 0810    Visit Number 2    Number of Visits 12    Date for PT Re-Evaluation 08/14/20    Authorization Type Medicare + BCBS    Progress Note Due on Visit 10    PT Start Time 0804    PT Stop Time 0845    PT Time Calculation (min) 41 min    Activity Tolerance Patient tolerated treatment well    Behavior During Therapy [facility] for tasks assessed/performed           Past Medical History:  Diagnosis Date  . Allergic rhinitis   . Common migraine   . Hypertension     Past Surgical History:  Procedure Laterality Date  . CARPAL TUNNEL RELEASE Left 01/05/2016  . CARPAL TUNNEL RELEASE Right 04/2018  . TYMPANOSTOMY TUBE PLACEMENT      There were no vitals filed for this visit.   Subjective Assessment - 07/12/20 0807    Subjective It feels better from doing HEP, but does bother her when she works more using arm. 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    Limitations Lifting    Diagnostic tests R shoulder MRI 05/27/20: 1. Near complete full-thickness tear of the supraspinatus tendon with 2.6 cm of retraction of fibers. There is resultant high-riding humeral head and fluid in the subacromial-subdeltoid bursa.  2. Focal full-thickness tear of the superior subscapularis tendon.  3. Moderate subscapularis and infraspinatus tendinosis.  4. Mild-to-moderate glenohumeral joint chondral disease and a nondisplaced tear with degeneration of the posterosuperior labrum.    Patient Stated Goals Wants to avoid surgery; wants to be free of pain and be able to garden and  do activities without this pain    Currently in Pain? No/denies    Pain Score 0-No pain    Pain Location Shoulder    Pain Orientation Right                             [facility] Adult PT Treatment/Exercise - 07/12/20 0001      Self-Care   Self-Care Other Self-Care Comments    Other Self-Care Comments  see pt edu      Exercises   Exercises Shoulder      Shoulder Exercises: Supine   External Rotation Both;15 reps;Theraband    Theraband Level (Shoulder External Rotation) Level 2 (Red)      Shoulder Exercises: Sidelying   External Rotation Strengthening;Right    External Rotation Limitations 3x30" isometric towel roll under arm, 1# DB      Shoulder Exercises: ROM/Strengthening   UBE (Upper Arm Bike) L1 3 fwd/3 bkwd      Manual Therapy   Manual Therapy Soft tissue mobilization;Joint mobilization;Passive ROM    Joint Mobilization Post/inf glides grade 2-3    Soft tissue mobilization STM/DTM to B UT and LS    Passive ROM Shoulder all planes gentle                  PT Education - 07/12/20 1113  Education Details added to HEP    Person(s) Educated Patient    Methods Explanation;Demonstration;Tactile cues;Verbal cues;Handout    Comprehension Verbalized understanding;Returned demonstration;Verbal cues required;Tactile cues required            PT Short Term Goals - 07/03/20 1009      PT SHORT TERM GOAL #1   Title Patient will be independent with initial HEP    Status New    Target Date 07/24/20             PT Long Term Goals - 07/03/20 1010      PT LONG TERM GOAL #1   Title Patient will be independent with ongoing/advanced HEP for self-management at home    Status New    Target Date 08/14/20      PT LONG TERM GOAL #2   Title Improve posture and alignment with patient to demonstrate improved FHP and Rounded shoulders    Status New    Target Date 08/14/20      PT LONG TERM GOAL #3   Title Patient will demonstrate improved R shoulder  strength to >/= 4+/5 for functional UE use    Status New    Target Date 08/14/20      PT LONG TERM GOAL #4   Title Patient to report ability to perform gardening, ADL's and work-related tasks without limitation due to R shoulder pain    Status New    Target Date 08/14/20                 Plan - 07/12/20 0811    Clinical Impression Statement Lori Goodwin presents with some improvement in R shoulder pain. She tolerated tx well without increase in pain and good ability to fire posterior cuff/external rotators with sidelying isometric external rotation + 1# DB. Added this exercise to home program providing handout. Pt will continue to benefit from skilled interventions to improve shoulder strength and pain free mobility.    Personal Factors and Comorbidities Comorbidity 3+;Time since onset of injury/illness/exacerbation;Past/Current Experience;Age    Comorbidities B carpal tunnel release, HTN, lumbar DJD, R knee pain, midfoot OA, L anterior tibialis pain/tendinitis, allergies    Examination-Activity Limitations Reach Overhead;Lift;Carry    Examination-Participation Restrictions Meal Prep;Occupation;Cleaning;Yard Work    Stability/Clinical Decision Making Stable/Uncomplicated    Rehab Potential Excellent    PT Frequency 2x / week    PT Duration 6 weeks    PT Treatment/Interventions ADLs/Self Care Home Management;Cryotherapy;Electrical Stimulation;Iontophoresis 4mg /ml Dexamethasone;Moist Heat;Ultrasound;Functional mobility training;Therapeutic activities;Therapeutic exercise;Neuromuscular re-education;Patient/family education;Manual techniques;Passive range of motion;Dry needling;Taping;Vasopneumatic Device;Joint Manipulations    PT Next Visit Plan Progress RC strengthening with isometrics; STM/DTM and modalities PRN; Progress scapular stabilization; Consider DN; P/AA/AROM for R Shoulder    PT Home Exercise Plan [email or web address]    Consulted and Agree with Plan of Care Patient           Patient will  benefit from skilled therapeutic intervention in order to improve the following deficits and impairments:  Decreased activity tolerance,Decreased strength,Increased fascial restricitons,Increased muscle spasms,Impaired perceived functional ability,Impaired flexibility,Impaired UE functional use,Postural dysfunction,Improper body mechanics,Pain,Impaired tone  Visit Diagnosis: Acute pain of right shoulder  Abnormal posture  Muscle weakness (generalized)  Other muscle spasm     Problem List Patient Active Problem List   Diagnosis Date Noted  . Rotator cuff arthropathy of right shoulder 03/06/2020  . Lipoma of lower extremity 01/03/2020  . Patellofemoral arthritis of right knee 11/16/2019  . Acute left lumbar radiculopathy 09/14/2019  . Left tibialis posterior tendinitis 06/29/2019  .  Hyperglycemia 10/05/2018  . Hypertension 05/10/2018  . Abnormality of gait 06/30/2016  . Arthritis of midfoot 06/02/2016  . PCP NOTES >>>> 03/18/2015  . CTS (carpal tunnel syndrome) 08/01/2014  . Eczema, ear canal 05/15/2014  . Eustachian tube dysfunction 04/08/2014  . Annual physical exam 02/19/2014  . DJD (degenerative joint disease) 01/15/2012  . Fatigue 10/31/2010  . Menopause 10/31/2010  . ALLERGIC RHINITIS 01/07/2007  . COMMON MIGRAINE 12/15/2006    Izell Farmington, PT, DPT 07/12/2020, 11:13 AM  [facility] [address]  [place], [place], [address] Phone: [phone]   Fax:  [phone]  Name: Lori Goodwin MRN: [id number] Date of Birth: 20-Jun-1945

## 2020-07-16 ENCOUNTER — Other Ambulatory Visit: Payer: Self-pay | Admitting: Internal Medicine

## 2020-07-17 ENCOUNTER — Other Ambulatory Visit: Payer: Self-pay

## 2020-07-17 ENCOUNTER — Ambulatory Visit: Payer: Medicare Other | Admitting: Physical Therapy

## 2020-07-17 ENCOUNTER — Encounter: Payer: Self-pay | Admitting: Physical Therapy

## 2020-07-17 DIAGNOSIS — M62838 Other muscle spasm: Secondary | ICD-10-CM | POA: Diagnosis not present

## 2020-07-17 DIAGNOSIS — M6281 Muscle weakness (generalized): Secondary | ICD-10-CM

## 2020-07-17 DIAGNOSIS — M25511 Pain in right shoulder: Secondary | ICD-10-CM | POA: Diagnosis not present

## 2020-07-17 DIAGNOSIS — R293 Abnormal posture: Secondary | ICD-10-CM

## 2020-07-17 NOTE — Therapy (Signed)
[facility] [address]  [place], [place], [address] Phone: [phone]   Fax:  [phone]  Physical Therapy Treatment  Patient Details  Name: Lori Goodwin MRN: [id number] Date of Birth: 1945-08-04 Referring Provider (PT): [name], DO   Encounter Date: 07/17/2020   PT End of Session - 07/17/20 0852    Visit Number 3    Number of Visits 12    Date for PT Re-Evaluation 08/14/20    Authorization Type Medicare + BCBS    Progress Note Due on Visit 10    PT Start Time 0804    PT Stop Time 0848    PT Time Calculation (min) 44 min    Activity Tolerance Patient tolerated treatment well    Behavior During Therapy [facility] for tasks assessed/performed           Past Medical History:  Diagnosis Date  . Allergic rhinitis   . Common migraine   . Hypertension     Past Surgical History:  Procedure Laterality Date  . CARPAL TUNNEL RELEASE Left 01/05/2016  . CARPAL TUNNEL RELEASE Right 04/2018  . TYMPANOSTOMY TUBE PLACEMENT      There were no vitals filed for this visit.   Subjective Assessment - 07/17/20 0807    Subjective Pt. reports at night her R shoulder gets uncomfortable and she has to find a position. The shoulder doesn't bother her as much during the day. 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    Limitations Lifting    Diagnostic tests R shoulder MRI 05/27/20: 1. Near complete full-thickness tear of the supraspinatus tendon with 2.6 cm of retraction of fibers. There is resultant high-riding humeral head and fluid in the subacromial-subdeltoid bursa.  2. Focal full-thickness tear of the superior subscapularis tendon.  3. Moderate subscapularis and infraspinatus tendinosis.  4. Mild-to-moderate glenohumeral joint chondral disease and a nondisplaced tear with degeneration of the posterosuperior labrum.    Patient Stated Goals Wants to avoid surgery; wants to  be free of pain and be able to garden and do activities without this pain    Currently in Pain? No/denies    Pain Score 0-No pain    Pain Location Shoulder    Pain Orientation Right                             [facility] Adult PT Treatment/Exercise - 07/17/20 0805      Exercises   Exercises Shoulder      Shoulder Exercises: Standing   External Rotation Strengthening;Right;10 reps   3 second hold   External Rotation Limitations isometrics against doorway with towel between elbow    Internal Rotation Strengthening;Right;10 reps   3 second hold   Internal Rotation Limitations Isometrics against the doorway with towel between R elbow and towel on doorway    Flexion Strengthening;Right;10 reps   3 second hold   Flexion Limitations Resisited isometrics into the wall with towel    Extension Strengthening;Right;10 reps   3 second hold   Extension Limitations resisted isometrics standing with towel into wall    Row Both;10 reps;Theraband;Strengthening    Theraband Level (Shoulder Row) Level 1 (Yellow)    Row Limitations Standing at doorway      Shoulder Exercises: ROM/Strengthening   UBE (Upper Arm Bike) L2 3 fwd/3 bkwd      Manual Therapy  Manual Therapy Soft tissue mobilization;Joint mobilization    Joint Mobilization Lateral glide; Post/inf glides grade 3-4    Soft tissue mobilization STM/DTM to B UT and LS and R Biceps    Passive ROM Shoulder all planes gentle                  PT Education - 07/17/20 0859    Education Details HEP update; Sleeping position changes    Person(s) Educated Patient    Methods Explanation;Demonstration;Tactile cues;Verbal cues;Handout    Comprehension Verbalized understanding;Returned demonstration;Verbal cues required;Tactile cues required            PT Short Term Goals - 07/17/20 0900      PT SHORT TERM GOAL #1   Title Patient will be independent with initial HEP    Status On-going    Target Date 07/24/20              PT Long Term Goals - 07/17/20 0900      PT LONG TERM GOAL #1   Title Patient will be independent with ongoing/advanced HEP for self-management at home    Status On-going    Target Date 08/14/20      PT LONG TERM GOAL #2   Title Improve posture and alignment with patient to demonstrate improved FHP and Rounded shoulders    Status On-going    Target Date 08/14/20      PT LONG TERM GOAL #3   Title Patient will demonstrate improved R shoulder strength to >/= 4+/5 for functional UE use    Status On-going    Target Date 08/14/20      PT LONG TERM GOAL #4   Title Patient to report ability to perform gardening, ADL's and work-related tasks without limitation due to R shoulder pain    Status On-going    Target Date 08/14/20                 Plan - 07/17/20 0852    Clinical Impression Statement Lori Goodwin reports she has no R Shoulder pain during the day with her work or home activities and instead feels tightness. She does have trouble finding comfortable sleeping positions. She has been following her HEP and has no questions. She presents with tightness in B UT and LS and R Biceps. Tension was relieved with STM/DTM. Her PROM and AROM has improved and will be assessed next visit. She is ready to progress into isometric and AAROM strengthening. Pt. will continue to benefit form skilled PT to increase R shoulder strength, endurance, and ROM to improve daily function.    Personal Factors and Comorbidities Comorbidity 3+;Time since onset of injury/illness/exacerbation;Past/Current Experience;Age    Comorbidities B carpal tunnel release, HTN, lumbar DJD, R knee pain, midfoot OA, L anterior tibialis pain/tendinitis, allergies    Examination-Activity Limitations Reach Overhead;Lift;Carry    Examination-Participation Restrictions Meal Prep;Occupation;Cleaning;Yard Work    Stability/Clinical Decision Making Stable/Uncomplicated    Rehab Potential Excellent    PT Frequency 2x / week    PT  Duration 6 weeks    PT Treatment/Interventions ADLs/Self Care Home Management;Cryotherapy;Electrical Stimulation;Iontophoresis 4mg /ml Dexamethasone;Moist Heat;Ultrasound;Functional mobility training;Therapeutic activities;Therapeutic exercise;Neuromuscular re-education;Patient/family education;Manual techniques;Passive range of motion;Dry needling;Taping;Vasopneumatic Device;Joint Manipulations    PT Next Visit Plan Assess R Shoulder ROM and Strength; Progress RC strengthening with isometrics; STM/DTM and modalities PRN; Progress scapular stabilization; Consider DN; AA/AROM for R Shoulder    PT Home Exercise Plan Medbridge Access Codes: [email or web address] 2022-07-05, updated 4/8); [id number] (4/13)    Consulted and Agree  with Plan of Care Patient           Patient will benefit from skilled therapeutic intervention in order to improve the following deficits and impairments:  Decreased activity tolerance,Decreased strength,Increased fascial restricitons,Increased muscle spasms,Impaired perceived functional ability,Impaired flexibility,Impaired UE functional use,Postural dysfunction,Improper body mechanics,Pain,Impaired tone  Visit Diagnosis: Acute pain of right shoulder  Abnormal posture  Muscle weakness (generalized)  Other muscle spasm     Problem List Patient Active Problem List   Diagnosis Date Noted  . Rotator cuff arthropathy of right shoulder 03/06/2020  . Lipoma of lower extremity 01/03/2020  . Patellofemoral arthritis of right knee 11/16/2019  . Acute left lumbar radiculopathy 09/14/2019  . Left tibialis posterior tendinitis 06/29/2019  . Hyperglycemia 10/05/2018  . Hypertension 05/10/2018  . Abnormality of gait 06/30/2016  . Arthritis of midfoot 06/02/2016  . PCP NOTES >>>> 03/18/2015  . CTS (carpal tunnel syndrome) 08/01/2014  . Eczema, ear canal 05/15/2014  . Eustachian tube dysfunction 04/08/2014  . Annual physical exam 02/19/2014  . DJD (degenerative joint disease) 01/15/2012  .  Fatigue 10/31/2010  . Menopause 10/31/2010  . ALLERGIC RHINITIS 01/07/2007  . COMMON MIGRAINE 12/15/2006    Newman Nickels SPT 07/17/2020, 1:01 PM  [facility] [address]  [place], [place], [address] Phone: [phone]   Fax:  [phone]  Name: Lori Goodwin MRN: [id number] Date of Birth: [birth year]

## 2020-07-17 NOTE — Patient Instructions (Signed)
    Access Code: [email or web address] URL: [email or web address] Date: 07/17/2020 Prepared by: Annie Paras  Exercises Isometric Shoulder Flexion at Wall - 1 x daily - 7 x weekly - 2 sets - 10 reps - 3 sec hold Standing Isometric Shoulder Internal Rotation at Doorway - 1 x daily - 7 x weekly - 2 sets - 10 reps - 3 sec hold Isometric Shoulder Extension at Wall - 1 x daily - 7 x weekly - 2 sets - 10 reps - 3 sec hold Standing Isometric Shoulder External Rotation with Doorway - 1 x daily - 7 x weekly - 2 sets - 10 reps - 3 sec hold Standing Shoulder Row with Anchored Resistance - 1 x daily - 7 x weekly - 2 sets - 10 reps - 3 sec hold

## 2020-07-19 ENCOUNTER — Other Ambulatory Visit: Payer: Self-pay

## 2020-07-19 ENCOUNTER — Encounter: Payer: Self-pay | Admitting: Physical Therapy

## 2020-07-19 ENCOUNTER — Ambulatory Visit: Payer: Medicare Other | Admitting: Physical Therapy

## 2020-07-19 DIAGNOSIS — R293 Abnormal posture: Secondary | ICD-10-CM | POA: Diagnosis not present

## 2020-07-19 DIAGNOSIS — M62838 Other muscle spasm: Secondary | ICD-10-CM

## 2020-07-19 DIAGNOSIS — M6281 Muscle weakness (generalized): Secondary | ICD-10-CM | POA: Diagnosis not present

## 2020-07-19 DIAGNOSIS — M25511 Pain in right shoulder: Secondary | ICD-10-CM

## 2020-07-19 NOTE — Therapy (Signed)
[facility] [address]  [place], [place], [address] Phone: [phone]   Fax:  [phone]  Physical Therapy Treatment  Patient Details  Name: Lori Goodwin MRN: [id number] Date of Birth: 07/24/1945 Referring Provider (PT): [name], DO   Encounter Date: 07/19/2020   PT End of Session - 07/19/20 0934    Visit Number 4    Number of Visits 12    Date for PT Re-Evaluation 08/14/20    Authorization Type Medicare + BCBS    Progress Note Due on Visit 10    PT Start Time 0847    PT Stop Time 0929    PT Time Calculation (min) 42 min    Activity Tolerance Patient tolerated treatment well    Behavior During Therapy [facility] for tasks assessed/performed           Past Medical History:  Diagnosis Date  . Allergic rhinitis   . Common migraine   . Hypertension     Past Surgical History:  Procedure Laterality Date  . CARPAL TUNNEL RELEASE Left 01/05/2016  . CARPAL TUNNEL RELEASE Right 04/2018  . TYMPANOSTOMY TUBE PLACEMENT      There were no vitals filed for this visit.   Subjective Assessment - 07/19/20 0850    Subjective Pt. reports she did have some pain over the location of the tear in her R shoulder. Used medication she usually doesn't have to for the arm pain. She was able to sleep without problem last night and has been trying to sleep on left side to avoid putting pressure on right shoulder. Felt good following Wednesday's treatment.    Limitations Lifting    Diagnostic tests R shoulder MRI 05/27/20: 1. Near complete full-thickness tear of the supraspinatus tendon with 2.6 cm of retraction of fibers. There is resultant high-riding humeral head and fluid in the subacromial-subdeltoid bursa.  2. Focal full-thickness tear of the superior subscapularis tendon.  3. Moderate subscapularis and infraspinatus tendinosis.  4. Mild-to-moderate glenohumeral joint chondral disease and a nondisplaced tear with  degeneration of the posterosuperior labrum.    Patient Stated Goals Wants to avoid surgery; wants to be free of pain and be able to garden and do activities without this pain    Currently in Pain? No/denies    Pain Score 0-No pain    Pain Location Shoulder    Pain Orientation Right              [facility] PT Assessment - 07/19/20 0854      AROM   Right Shoulder Flexion 170 Degrees    Right Shoulder ABduction 178 Degrees    Right Shoulder Internal Rotation 89 Degrees    Right Shoulder External Rotation 64 Degrees      Strength   Right Shoulder Flexion 4-/5    Right Shoulder ABduction 4/5    Right Shoulder Internal Rotation 4/5    Right Shoulder External Rotation 4-/5                         [facility] Adult PT Treatment/Exercise - 07/19/20 0849      Exercises   Exercises Shoulder      Shoulder Exercises: Supine   Flexion AROM;Strengthening;Both;20 reps;Weights    Shoulder Flexion Weight (lbs) 1 x 10 no weight; 1 x 10 #4 oz using theraputty; 3 second holds   1 pound was a little painful   ABduction AROM;Strengthening;Both;20 reps;Weights  Shoulder ABduction Weight (lbs) 1 x 10 no weight; 1 x 10 #4oz theraputty; 3 second holds      Shoulder Exercises: Standing   External Rotation Strengthening;Right;10 reps;Theraband    Theraband Level (Shoulder External Rotation) Level 2 (Red)    External Rotation Limitations isometric step-outs    Internal Rotation Strengthening;Right;10 reps;Theraband    Theraband Level (Shoulder Internal Rotation) Level 2 (Red)    Internal Rotation Limitations isometric step-outs    Extension Strengthening;Both;10 reps;Theraband    Theraband Level (Shoulder Extension) Level 2 (Red)    Row Strengthening;Both;10 reps;Theraband    Theraband Level (Shoulder Row) Level 2 (Red)    Row Limitations Standing at doorway      Shoulder Exercises: ROM/Strengthening   UBE (Upper Arm Bike) L2 3 fwd/3 bkwd                    PT Short Term  Goals - 07/17/20 0900      PT SHORT TERM GOAL #1   Title Patient will be independent with initial HEP    Status On-going    Target Date 07/24/20             PT Long Term Goals - 07/17/20 0900      PT LONG TERM GOAL #1   Title Patient will be independent with ongoing/advanced HEP for self-management at home    Status On-going    Target Date 08/14/20      PT LONG TERM GOAL #2   Title Improve posture and alignment with patient to demonstrate improved FHP and Rounded shoulders    Status On-going    Target Date 08/14/20      PT LONG TERM GOAL #3   Title Patient will demonstrate improved R shoulder strength to >/= 4+/5 for functional UE use    Status On-going    Target Date 08/14/20      PT LONG TERM GOAL #4   Title Patient to report ability to perform gardening, ADL's and work-related tasks without limitation due to R shoulder pain    Status On-going    Target Date 08/14/20                 Plan - 07/19/20 0927    Clinical Impression Statement Lori Goodwin reports she had some pain on Thursday following 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 She did not have pain or trouble sleeping last night and has been sleeping on her left side now. She presents with an increase in R shoulder ROM to [facility] and without pain, only feeling a stretch. Her R shoulder strength still shows deficits with most scoring < / = 4/5. She is ready to progress her exercises for more AROM as tolerated and to strengthen RC and scapular stabilizers. Pt will continue to benefit from skilled PT to increase R shoulder strength and endurance for improved function.    Personal Factors and Comorbidities Comorbidity 3+;Time since onset of injury/illness/exacerbation;Past/Current Experience;Age    Comorbidities B carpal tunnel release, HTN, lumbar DJD, R knee pain, midfoot OA, L anterior tibialis pain/tendinitis, allergies    Examination-Activity Limitations Reach Overhead;Lift;Carry    Examination-Participation  Restrictions Meal Prep;Occupation;Cleaning;Yard Work    Stability/Clinical Decision Making Stable/Uncomplicated    Rehab Potential Excellent    PT Frequency 2x / week    PT Duration 6 weeks    PT Treatment/Interventions ADLs/Self Care Home Management;Cryotherapy;Electrical Stimulation;Iontophoresis 4mg /ml Dexamethasone;Moist Heat;Ultrasound;Functional mobility training;Therapeutic activities;Therapeutic exercise;Neuromuscular re-education;Patient/family education;Manual techniques;Passive range of motion;Dry needling;Taping;Vasopneumatic Device;Joint Manipulations  PT Next Visit Plan Assess STG 1 and update HEP; Progress RC strengthening; STM/DTM and modalities PRN; Progress scapular stabilization; AROM for R Shoulder    PT Home Exercise Plan [employer] Access Codes: [id number] 2022/07/25, updated 4/8); [email or web address] (4/13)    Consulted and Agree with Plan of Care Patient           Patient will benefit from skilled therapeutic intervention in order to improve the following deficits and impairments:  Decreased activity tolerance,Decreased strength,Increased fascial restricitons,Increased muscle spasms,Impaired perceived functional ability,Impaired flexibility,Impaired UE functional use,Postural dysfunction,Improper body mechanics,Pain,Impaired tone  Visit Diagnosis: Acute pain of right shoulder  Abnormal posture  Muscle weakness (generalized)  Other muscle spasm     Problem List Patient Active Problem List   Diagnosis Date Noted  . Rotator cuff arthropathy of right shoulder 03/06/2020  . Lipoma of lower extremity 01/03/2020  . Patellofemoral arthritis of right knee 11/16/2019  . Acute left lumbar radiculopathy 09/14/2019  . Left tibialis posterior tendinitis 06/29/2019  . Hyperglycemia 10/05/2018  . Hypertension 05/10/2018  . Abnormality of gait 06/30/2016  . Arthritis of midfoot 06/02/2016  . PCP NOTES >>>> 03/18/2015  . CTS (carpal tunnel syndrome) 08/01/2014  . Eczema, ear canal  05/15/2014  . Eustachian tube dysfunction 04/08/2014  . Annual physical exam 02/19/2014  . DJD (degenerative joint disease) 01/15/2012  . Fatigue 10/31/2010  . Menopause 10/31/2010  . ALLERGIC RHINITIS 01/07/2007  . COMMON MIGRAINE 12/15/2006    Newman Nickels SPT 07/19/2020, 9:36 AM  [facility] [address]  [place], [place], [address] Phone: [phone]   Fax:  [phone]  Name: Lori Goodwin MRN: [id number] Date of Birth: 1946-02-02

## 2020-07-24 ENCOUNTER — Other Ambulatory Visit: Payer: Self-pay

## 2020-07-24 ENCOUNTER — Encounter: Payer: Self-pay | Admitting: Physical Therapy

## 2020-07-24 ENCOUNTER — Ambulatory Visit: Payer: Medicare Other | Admitting: Physical Therapy

## 2020-07-24 DIAGNOSIS — M62838 Other muscle spasm: Secondary | ICD-10-CM

## 2020-07-24 DIAGNOSIS — M6281 Muscle weakness (generalized): Secondary | ICD-10-CM | POA: Diagnosis not present

## 2020-07-24 DIAGNOSIS — M25511 Pain in right shoulder: Secondary | ICD-10-CM | POA: Diagnosis not present

## 2020-07-24 DIAGNOSIS — R293 Abnormal posture: Secondary | ICD-10-CM

## 2020-07-24 NOTE — Patient Instructions (Signed)
    Access Code: [email or web address] URL: [email or web address] Date: 07/24/2020 Prepared by: Annie Paras  Exercises Sub-Occipital Cervical Stretch - 1 x daily - 7 x weekly - 3 reps - 30 sec hold Standing Shoulder Flexion with Resistance - 1 x daily - 3-4 x weekly - 2 sets - 10 reps - 2-3 sec hold Scaption with Resistance - 1 x daily - 3-4 x weekly - 2 sets - 10 reps - 2-3 sec hold

## 2020-07-24 NOTE — Therapy (Addendum)
[facility] [address]  [place], [place], [address] Phone: [phone]   Fax:  [phone]  Physical Therapy Treatment  Patient Details  Name: Lori Goodwin MRN: [id number] Date of Birth: [birth year] Referring Provider (PT): [name], DO   Encounter Date: 07/24/2020   PT End of Session - 07/24/20 1039    Visit Number 5    Number of Visits 12    Date for PT Re-Evaluation 08/14/20    Authorization Type Medicare + BCBS    Progress Note Due on Visit 10    PT Start Time 0848    PT Stop Time 0930    PT Time Calculation (min) 42 min    Activity Tolerance Patient tolerated treatment well    Behavior During Therapy [facility] for tasks assessed/performed           Past Medical History:  Diagnosis Date  . Allergic rhinitis   . Common migraine   . Hypertension     Past Surgical History:  Procedure Laterality Date  . CARPAL TUNNEL RELEASE Left 01/05/2016  . CARPAL TUNNEL RELEASE Right 04/2018  . TYMPANOSTOMY TUBE PLACEMENT      There were no vitals filed for this visit.   Subjective Assessment - 07/24/20 0850    Subjective Pt reports her shoulder is doing better. Noted some soreness after last session that resolved and reports a stretch during HEP exercises that goes away after completing exercises. She has been having some tightness at the base of her head/upper neck on the R.    Diagnostic tests R shoulder MRI 05/27/20: 1. Near complete full-thickness tear of the supraspinatus tendon with 2.6 cm of retraction of fibers. There is resultant high-riding humeral head and fluid in the subacromial-subdeltoid bursa.  2. Focal full-thickness tear of the superior subscapularis tendon.  3. Moderate subscapularis and infraspinatus tendinosis.  4. Mild-to-moderate glenohumeral joint chondral disease and a nondisplaced tear with degeneration of the posterosuperior labrum.    Patient Stated Goals Wants to avoid surgery;  wants to be free of pain and be able to garden and do activities without this pain    Currently in Pain? No/denies                             [facility] Adult PT Treatment/Exercise - 07/24/20 0848      Exercises   Exercises Shoulder      Shoulder Exercises: Standing   External Rotation Strengthening;Right;10 reps    Theraband Level (Shoulder External Rotation) Level 1 (Yellow)    External Rotation Limitations Active at the doorway with towel underneath R elbow    Internal Rotation Strengthening;AROM;Right;10 reps;Theraband    Theraband Level (Shoulder Internal Rotation) Level 1 (Yellow)    Internal Rotation Limitations Active at the doorway    Flexion Strengthening;AROM;Right;20 reps;Theraband   3 second holds   Theraband Level (Shoulder Flexion) Level 1 (Yellow)    Flexion Limitations Cuing to correct form and limit R shoulder elevation;   Mirror used for form   ABduction AROM;Strengthening;Right;10 reps;Theraband    Theraband Level (Shoulder ABduction) Level 1 (Yellow)    ABduction Limitations Cuing to avoid shoulder elevation   Mirror used for form   Other Standing Exercises Cabinet reaches to 2nd shelf with 4oz theraputy 1 x 10      Shoulder Exercises: ROM/Strengthening   UBE (Upper Arm Bike) L2.0 x 6 min (3 fwd/3 bkwd)  Lat Pull 10 reps    Lat Pull Limitations Cuing for correct form and use of shoulder blades instead of arms   1 x 10 #10     Manual Therapy   Manual Therapy Soft tissue mobilization    Soft tissue mobilization STM/DTM to B UT and LS   R>L     Neck Exercises: Stretches   Other Neck Stretches Chin tuck with suboccipital stretch 3 x 30 seconds                  PT Education - 07/24/20 1038    Education Details HEP update code: [email or web address]    Person(s) Educated Patient    Methods Demonstration;Explanation;Tactile cues;Handout;Verbal cues    Comprehension Tactile cues required;Verbal cues required;Verbalized understanding;Returned  demonstration            PT Short Term Goals - 07/24/20 1045      PT SHORT TERM GOAL #1   Title Patient will be independent with initial HEP    Status Achieved    Target Date 07/24/20             PT Long Term Goals - 07/17/20 0900      PT LONG TERM GOAL #1   Title Patient will be independent with ongoing/advanced HEP for self-management at home    Status On-going    Target Date 08/14/20      PT LONG TERM GOAL #2   Title Improve posture and alignment with patient to demonstrate improved FHP and Rounded shoulders    Status On-going    Target Date 08/14/20      PT LONG TERM GOAL #3   Title Patient will demonstrate improved R shoulder strength to >/= 4+/5 for functional UE use    Status On-going    Target Date 08/14/20      PT LONG TERM GOAL #4   Title Patient to report ability to perform gardening, ADL's and work-related tasks without limitation due to R shoulder pain    Status On-going    Target Date 08/14/20                 Plan - 07/24/20 1039    Clinical Impression Statement [name] reports her R shoulder is doing well. She did feel some soreness following Friday's treatment and does feel tightness in her R UT and R upper neck. She has met STG #1. She presented with tightness in these areas, and this was addressed with STM/DTM and suboccipital stretches. She was able to progress RC strengthening to AROM in standing with use of a yellow t band. She required cuing for posture and mirror therapy was used to ensure proper form. She was also able to progress with scapular stabilizer strengthening without pain. She will continue to benefit from skilled PT to progress her RC strengthening and scapular stabilizer strengthening to improve R shoulder function and endurance.    Personal Factors and Comorbidities Comorbidity 3+;Time since onset of injury/illness/exacerbation;Past/Current Experience;Age    Comorbidities B carpal tunnel release, HTN, lumbar DJD, R knee pain,  midfoot OA, L anterior tibialis pain/tendinitis, allergies    Examination-Activity Limitations Reach Overhead;Lift;Carry    Examination-Participation Restrictions Meal Prep;Occupation;Cleaning;Yard Work    Stability/Clinical Decision Making Stable/Uncomplicated    Rehab Potential Excellent    PT Frequency 2x / week    PT Duration 6 weeks    PT Treatment/Interventions ADLs/Self Care Home Management;Cryotherapy;Electrical Stimulation;Iontophoresis 1m/ml Dexamethasone;Moist Heat;Ultrasound;Functional mobility training;Therapeutic activities;Therapeutic exercise;Neuromuscular re-education;Patient/family education;Manual techniques;Passive range of motion;Dry needling;Taping;Vasopneumatic Device;Joint  Manipulations    PT Next Visit Plan Progress RC strengthening as tolerated; STM/DTM and modalities PRN; Progress scapular stabilization; AROM for R Shoulder    PT Home Exercise Plan [place] Access Codes: [email or web address] 09-Jul-2022, updated 4/8); [id number] (4/13); [id number](7/03)    Consulted and Agree with Plan of Care Patient           Patient will benefit from skilled therapeutic intervention in order to improve the following deficits and impairments:  Decreased activity tolerance,Decreased strength,Increased fascial restricitons,Increased muscle spasms,Impaired perceived functional ability,Impaired flexibility,Impaired UE functional use,Postural dysfunction,Improper body mechanics,Pain,Impaired tone  Visit Diagnosis: Acute pain of right shoulder  Abnormal posture  Muscle weakness (generalized)  Other muscle spasm     Problem List Patient Active Problem List   Diagnosis Date Noted  . Rotator cuff arthropathy of right shoulder 03/06/2020  . Lipoma of lower extremity 01/03/2020  . Patellofemoral arthritis of right knee 11/16/2019  . Acute left lumbar radiculopathy 09/14/2019  . Left tibialis posterior tendinitis 06/29/2019  . Hyperglycemia 10/05/2018  . Hypertension 05/10/2018  . Abnormality of  gait 06/30/2016  . Arthritis of midfoot 06/02/2016  . PCP NOTES >>>> 03/18/2015  . CTS (carpal tunnel syndrome) 08/01/2014  . Eczema, ear canal 05/15/2014  . Eustachian tube dysfunction 04/08/2014  . Annual physical exam 02/19/2014  . DJD (degenerative joint disease) 01/15/2012  . Fatigue 10/31/2010  . Menopause 10/31/2010  . ALLERGIC RHINITIS 01/07/2007  . COMMON MIGRAINE 12/15/2006    Newman Nickels SPT 07/24/2020, 1:35 PM  [facility] [address]  [place], [place], [address] Phone: [phone]   Fax:  [phone]  Name: Lori Goodwin MRN: [id number] Date of Birth: [birth year]

## 2020-07-26 ENCOUNTER — Ambulatory Visit: Payer: Medicare Other | Admitting: Physical Therapy

## 2020-07-26 ENCOUNTER — Other Ambulatory Visit: Payer: Self-pay

## 2020-07-26 ENCOUNTER — Encounter: Payer: Self-pay | Admitting: Physical Therapy

## 2020-07-26 DIAGNOSIS — R293 Abnormal posture: Secondary | ICD-10-CM | POA: Diagnosis not present

## 2020-07-26 DIAGNOSIS — M25511 Pain in right shoulder: Secondary | ICD-10-CM | POA: Diagnosis not present

## 2020-07-26 DIAGNOSIS — M62838 Other muscle spasm: Secondary | ICD-10-CM | POA: Diagnosis not present

## 2020-07-26 DIAGNOSIS — M6281 Muscle weakness (generalized): Secondary | ICD-10-CM

## 2020-07-26 NOTE — Patient Instructions (Signed)
   Access Code: [email or web address] URL: [email or web address] Date: 07/26/2020 Prepared by: Annie Paras  Exercises Standing Shoulder Row with Anchored Resistance - 1 x daily - 7 x weekly - 2 sets - 10 reps - 3 sec hold Shoulder External Rotation and Scapular Retraction with Resistance - 1 x daily - 3-4 x weekly - 2 sets - 10 reps - 3 hold Shoulder PNF D2 Extension - 1 x daily - 3-4 x weekly - 2 sets - 10 reps - 1 hold Standing Single Arm Shoulder PNF D1 Extension - 1 x daily - 3-4 x weekly - 2 sets - 10 reps - 1 hold

## 2020-07-26 NOTE — Therapy (Signed)
[facility] [address]  [place], [place], [address] Phone: [phone]   Fax:  [phone]  Physical Therapy Treatment  Patient Details  Name: Lori Goodwin MRN: [id number] Date of Birth: 1946-03-02 Referring Provider (PT): [name], DO   Encounter Date: 07/26/2020   PT End of Session - 07/26/20 0852    Visit Number 6    Number of Visits 12    Date for PT Re-Evaluation 08/14/20    Authorization Type Medicare + BCBS    Progress Note Due on Visit 10    PT Start Time 0802    PT Stop Time 0845    PT Time Calculation (min) 43 min    Activity Tolerance Patient tolerated treatment well    Behavior During Therapy [facility] for tasks assessed/performed           Past Medical History:  Diagnosis Date  . Allergic rhinitis   . Common migraine   . Hypertension     Past Surgical History:  Procedure Laterality Date  . CARPAL TUNNEL RELEASE Left 01/05/2016  . CARPAL TUNNEL RELEASE Right 04/2018  . TYMPANOSTOMY TUBE PLACEMENT      There were no vitals filed for this visit.   Subjective Assessment - 07/26/20 0805    Subjective Pt reports her R shoulder is doing better since starting the exercises. She feels the exercises are strengthening her muscles annd feels her endurance has improved. Felt a little soreness following Wednesday but that went away by Thursday.    Diagnostic tests R shoulder MRI 05/27/20: 1. Near complete full-thickness tear of the supraspinatus tendon with 2.6 cm of retraction of fibers. There is resultant high-riding humeral head and fluid in the subacromial-subdeltoid bursa.  2. Focal full-thickness tear of the superior subscapularis tendon.  3. Moderate subscapularis and infraspinatus tendinosis.  4. Mild-to-moderate glenohumeral joint chondral disease and a nondisplaced tear with degeneration of the posterosuperior labrum.    Patient Stated Goals Wants to avoid surgery; wants to be free of  pain and be able to garden and do activities without this pain    Currently in Pain? No/denies    Pain Score 0-No pain                             [facility] Adult PT Treatment/Exercise - 07/26/20 0804      Exercises   Exercises Shoulder      Shoulder Exercises: Seated   Diagonals AROM;Right;12 reps    Diagonals Limitations D1 and D2 extension 1 x 12 each   Pt reports feels some fatigue near the end of each set.     Shoulder Exercises: Standing   External Rotation Strengthening;AROM;Both;20 reps;Theraband    Theraband Level (Shoulder External Rotation) Level 1 (Yellow)    External Rotation Limitations Standing using theraband and both arms at same time. Scapular retraction added at the end of each rep.      Shoulder Exercises: Therapy Ball   Flexion Both;10 reps    Flexion Limitations Wall walk-ups; 3 sets, 1 x 10 normal; 1 x 5 with stretch at end; 1 x 5 with strengthening at end    Other Therapy Ball Exercises Rhythmic stabilization holding PBall against wall with R hand and perturbations to the ball from PT 2 x 30 seconds      Shoulder Exercises: ROM/Strengthening   UBE (Upper Arm Bike) L2.5 x 6 min (3 fwd/3 bkwd)  Lat Pull 10 reps   10#   Cybex Row 10 reps   15#                 PT Education - 07/26/20 0852    Education Details HEP update code: [email or web address]    Person(s) Educated Patient    Methods Explanation;Demonstration;Tactile cues;Verbal cues;Handout    Comprehension Tactile cues required;Verbal cues required;Returned demonstration;Verbalized understanding            PT Short Term Goals - 07/24/20 1045      PT SHORT TERM GOAL #1   Title Patient will be independent with initial HEP    Status Achieved    Target Date 07/24/20             PT Long Term Goals - 07/17/20 0900      PT LONG TERM GOAL #1   Title Patient will be independent with ongoing/advanced HEP for self-management at home    Status On-going    Target Date 08/14/20       PT LONG TERM GOAL #2   Title Improve posture and alignment with patient to demonstrate improved FHP and Rounded shoulders    Status On-going    Target Date 08/14/20      PT LONG TERM GOAL #3   Title Patient will demonstrate improved R shoulder strength to >/= 4+/5 for functional UE use    Status On-going    Target Date 08/14/20      PT LONG TERM GOAL #4   Title Patient to report ability to perform gardening, ADL's and work-related tasks without limitation due to R shoulder pain    Status On-going    Target Date 08/14/20                 Plan - 07/26/20 0852    Clinical Impression Statement Lori Goodwin reports she has noticed her R shoulder is stronger and has more endurance. She had some soreness following Wednesday's treatment but has been able to follow her HEP without issue. She presents with improvements in R shoulder strength and endurance and can perform AROM with resistance and little fatigue following exercises. 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    Personal Factors and Comorbidities Comorbidity 3+;Time since onset of injury/illness/exacerbation;Past/Current Experience;Age    Comorbidities B carpal tunnel release, HTN, lumbar DJD, R knee pain, midfoot OA, L anterior tibialis pain/tendinitis, allergies    Examination-Activity Limitations Reach Overhead;Lift;Carry    Examination-Participation Restrictions Meal Prep;Occupation;Cleaning;Yard Work    Stability/Clinical Decision Making Stable/Uncomplicated    Rehab Potential Excellent    PT Frequency 2x / week    PT Duration 6 weeks    PT Treatment/Interventions ADLs/Self Care Home Management;Cryotherapy;Electrical Stimulation;Iontophoresis 4mg /ml Dexamethasone;Moist Heat;Ultrasound;Functional mobility training;Therapeutic activities;Therapeutic exercise;Neuromuscular re-education;Patient/family education;Manual techniques;Passive range of  motion;Dry needling;Taping;Vasopneumatic Device;Joint Manipulations    PT Next Visit Plan Progress RC strengthening as tolerated; STM/DTM and modalities PRN; Progress scapular stabilization; AROM and functional movements for R Shoulder    PT Home Exercise Plan Medbridge Access Codes: [id number] 07/23/22, updated 4/8); [id number] (4/13); [id number](6/44); [id number](5/63)    Consulted and Agree with Plan of Care Patient           Patient will benefit from skilled therapeutic intervention in order to improve the following deficits and impairments:  Decreased activity tolerance,Decreased strength,Increased fascial restricitons,Increased muscle spasms,Impaired perceived functional ability,Impaired flexibility,Impaired UE functional use,Postural dysfunction,Improper body mechanics,Pain,Impaired tone  Visit Diagnosis: Acute  pain of right shoulder  Abnormal posture  Muscle weakness (generalized)  Other muscle spasm     Problem List Patient Active Problem List   Diagnosis Date Noted  . Rotator cuff arthropathy of right shoulder 03/06/2020  . Lipoma of lower extremity 01/03/2020  . Patellofemoral arthritis of right knee 11/16/2019  . Acute left lumbar radiculopathy 09/14/2019  . Left tibialis posterior tendinitis 06/29/2019  . Hyperglycemia 10/05/2018  . Hypertension 05/10/2018  . Abnormality of gait 06/30/2016  . Arthritis of midfoot 06/02/2016  . PCP NOTES >>>> 03/18/2015  . CTS (carpal tunnel syndrome) 08/01/2014  . Eczema, ear canal 05/15/2014  . Eustachian tube dysfunction 04/08/2014  . Annual physical exam 02/19/2014  . DJD (degenerative joint disease) 01/15/2012  . Fatigue 10/31/2010  . Menopause 10/31/2010  . ALLERGIC RHINITIS 01/07/2007  . COMMON MIGRAINE 12/15/2006    Newman Nickels SPT 07/26/2020, 8:55 AM  [facility] [address]  [place], [place], [address] Phone: [phone]   Fax:  [phone]  Name:  Lori Goodwin MRN: [id number] Date of Birth: 1945-10-13

## 2020-07-31 ENCOUNTER — Other Ambulatory Visit: Payer: Self-pay

## 2020-07-31 ENCOUNTER — Encounter: Payer: Self-pay | Admitting: Physical Therapy

## 2020-07-31 ENCOUNTER — Ambulatory Visit: Payer: Medicare Other | Admitting: Physical Therapy

## 2020-07-31 DIAGNOSIS — M62838 Other muscle spasm: Secondary | ICD-10-CM

## 2020-07-31 DIAGNOSIS — M25511 Pain in right shoulder: Secondary | ICD-10-CM

## 2020-07-31 DIAGNOSIS — R293 Abnormal posture: Secondary | ICD-10-CM

## 2020-07-31 DIAGNOSIS — M6281 Muscle weakness (generalized): Secondary | ICD-10-CM | POA: Diagnosis not present

## 2020-07-31 NOTE — Therapy (Signed)
[facility] [address]  [place], [place], [address] Phone: [phone]   Fax:  [phone]  Physical Therapy Treatment  Patient Details  Name: Lori Goodwin MRN: [id number] Date of Birth: Mar 19, 1946 Referring Provider (PT): [name], DO   Encounter Date: 07/31/2020   PT End of Session - 07/31/20 0844    Visit Number 7    Number of Visits 12    Date for PT Re-Evaluation 08/14/20    Authorization Type Medicare + BCBS    Progress Note Due on Visit 10    PT Start Time 0806    PT Stop Time 0844    PT Time Calculation (min) 38 min    Activity Tolerance Patient tolerated treatment well    Behavior During Therapy [facility] for tasks assessed/performed           Past Medical History:  Diagnosis Date  . Allergic rhinitis   . Common migraine   . Hypertension     Past Surgical History:  Procedure Laterality Date  . CARPAL TUNNEL RELEASE Left 01/05/2016  . CARPAL TUNNEL RELEASE Right 04/2018  . TYMPANOSTOMY TUBE PLACEMENT      There were no vitals filed for this visit.   Subjective Assessment - 07/31/20 0809    Subjective Pt reports her R shoulder was sore following her exercises yesterday. She feels it in her biceps and still has some soreness today. Reports the exercises have been going well otherwise.    Diagnostic tests R shoulder MRI 05/27/20: 1. Near complete full-thickness tear of the supraspinatus tendon with 2.6 cm of retraction of fibers. There is resultant high-riding humeral head and fluid in the subacromial-subdeltoid bursa.  2. Focal full-thickness tear of the superior subscapularis tendon.  3. Moderate subscapularis and infraspinatus tendinosis.  4. Mild-to-moderate glenohumeral joint chondral disease and a nondisplaced tear with degeneration of the posterosuperior labrum.    Patient Stated Goals Wants to avoid surgery; wants to be free of pain and be able to garden and do activities without this  pain    Currently in Pain? No/denies    Pain Score 0-No pain                             [facility] Adult PT Treatment/Exercise - 07/31/20 0808      Exercises   Exercises Shoulder      Shoulder Exercises: Standing   Horizontal ABduction Strengthening;Both;12 reps    Theraband Level (Shoulder Horizontal ABduction) Level 2 (Red)    Flexion Strengthening;AROM;Both;Weights;20 reps    Shoulder Flexion Weight (lbs) 1#    ABduction AROM;Strengthening;Both;20 reps;Weights    Shoulder ABduction Weight (lbs) 1#    ABduction Limitations Performed in scaption plane    Extension Strengthening;Both;12 reps    Theraband Level (Shoulder Extension) Level 2 (Red)    Row AROM;Strengthening;Both;12 reps    Theraband Level (Shoulder Row) Level 2 (Red)    Row Limitations Standing at doorway    Diagonals Strengthening;Right;12 reps;Theraband   1 x 12 each D1 Flex-Ext; D2 Flex-Ext   Theraband Level (Shoulder Diagonals) Level 1 (Yellow)    Diagonals Limitations Standing at doorway    Other Standing Exercises Cabinet reaches from shelf under counter to 2nd shelf with 4oz theraputy 1 x 10    Other Standing Exercises Wall Wash   2 x 30 seconds side to side; 2 x 30 seconds up and down; 1 x 30 seconds  CW; 1 x 30 seconds CCW;     Shoulder Exercises: ROM/Strengthening   UBE (Upper Arm Bike) L3 x 6 min (3 fwd/3 bkwd)                    PT Short Term Goals - 07/24/20 1045      PT SHORT TERM GOAL #1   Title Patient will be independent with initial HEP    Status Achieved    Target Date 07/24/20             PT Long Term Goals - 07/17/20 0900      PT LONG TERM GOAL #1   Title Patient will be independent with ongoing/advanced HEP for self-management at home    Status On-going    Target Date 08/14/20      PT LONG TERM GOAL #2   Title Improve posture and alignment with patient to demonstrate improved FHP and Rounded shoulders    Status On-going    Target Date 08/14/20      PT  LONG TERM GOAL #3   Title Patient will demonstrate improved R shoulder strength to >/= 4+/5 for functional UE use    Status On-going    Target Date 08/14/20      PT LONG TERM GOAL #4   Title Patient to report ability to perform gardening, ADL's and work-related tasks without limitation due to R shoulder pain    Status On-going    Target Date 08/14/20                 Plan - 07/31/20 0846    Clinical Impression Statement Lori Goodwin reports some soreness in her R bicep following HEP yesterday and she still feels it some today. She can perform AROM in functional patterns with diagonals, wall washing, and cabinet reaches without pain and with proper form. She can perform shoulder flexion and abduction with 1 lb resistance without pain. She is ready to progress into further RC and scapular stabilization strengthening and will continue to benefit from skilled PT to improve her endurance for work and home activities.    Personal Factors and Comorbidities Comorbidity 3+;Time since onset of injury/illness/exacerbation;Past/Current Experience;Age    Comorbidities B carpal tunnel release, HTN, lumbar DJD, R knee pain, midfoot OA, L anterior tibialis pain/tendinitis, allergies    Examination-Activity Limitations Reach Overhead;Lift;Carry    Examination-Participation Restrictions Meal Prep;Occupation;Cleaning;Yard Work    Stability/Clinical Decision Making Stable/Uncomplicated    Rehab Potential Excellent    PT Frequency 2x / week    PT Duration 6 weeks    PT Treatment/Interventions ADLs/Self Care Home Management;Cryotherapy;Electrical Stimulation;Iontophoresis 4mg /ml Dexamethasone;Moist Heat;Ultrasound;Functional mobility training;Therapeutic activities;Therapeutic exercise;Neuromuscular re-education;Patient/family education;Manual techniques;Passive range of motion;Dry needling;Taping;Vasopneumatic Device;Joint Manipulations    PT Next Visit Plan Reassess HEP for anticipated upcoming DC and update as  needed.Progress RC strengthening as tolerated; Progress scapular stabilization; AROM and functional movements for R Shoulder with resistance;  STM/DTM and modalities PRN    PT Home Exercise Plan Medbridge Access Codes: [id number] 29-Jul-2022, updated 4/8); [id number] (4/13); [id number](9/02); [id number](9/92)    Consulted and Agree with Plan of Care Patient           Patient will benefit from skilled therapeutic intervention in order to improve the following deficits and impairments:  Decreased activity tolerance,Decreased strength,Increased fascial restricitons,Increased muscle spasms,Impaired perceived functional ability,Impaired flexibility,Impaired UE functional use,Postural dysfunction,Improper body mechanics,Pain,Impaired tone  Visit Diagnosis: Acute pain of right shoulder  Abnormal posture  Muscle weakness (generalized)  Other muscle spasm  Problem List Patient Active Problem List   Diagnosis Date Noted  . Rotator cuff arthropathy of right shoulder 03/06/2020  . Lipoma of lower extremity 01/03/2020  . Patellofemoral arthritis of right knee 11/16/2019  . Acute left lumbar radiculopathy 09/14/2019  . Left tibialis posterior tendinitis 06/29/2019  . Hyperglycemia 10/05/2018  . Hypertension 05/10/2018  . Abnormality of gait 06/30/2016  . Arthritis of midfoot 06/02/2016  . PCP NOTES >>>> 03/18/2015  . CTS (carpal tunnel syndrome) 08/01/2014  . Eczema, ear canal 05/15/2014  . Eustachian tube dysfunction 04/08/2014  . Annual physical exam 02/19/2014  . DJD (degenerative joint disease) 01/15/2012  . Fatigue 10/31/2010  . Menopause 10/31/2010  . ALLERGIC RHINITIS 01/07/2007  . COMMON MIGRAINE 12/15/2006    Newman Nickels SPT 07/31/2020, 9:31 AM  [facility] [address]  [place], [place], [address] Phone: [phone]   Fax:  [phone]  Name: Lori Goodwin MRN: [id number] Date of Birth: 1945/07/22

## 2020-07-31 NOTE — Therapy (Deleted)
[facility] [address]  [place], [place], [address] Phone: [phone]   Fax:  [phone]  Physical Therapy Treatment  Patient Details  Name: Lori Goodwin MRN: [id number] Date of Birth: Mar 19, 1946 Referring Provider (PT): [name], DO   Encounter Date: 07/31/2020   PT End of Session - 07/31/20 0844    Visit Number 7    Number of Visits 12    Date for PT Re-Evaluation 08/14/20    Authorization Type Medicare + BCBS    Progress Note Due on Visit 10    PT Start Time 0806    PT Stop Time 0844    PT Time Calculation (min) 38 min    Activity Tolerance Patient tolerated treatment well    Behavior During Therapy [facility] for tasks assessed/performed           Past Medical History:  Diagnosis Date  . Allergic rhinitis   . Common migraine   . Hypertension     Past Surgical History:  Procedure Laterality Date  . CARPAL TUNNEL RELEASE Left 01/05/2016  . CARPAL TUNNEL RELEASE Right 04/2018  . TYMPANOSTOMY TUBE PLACEMENT      There were no vitals filed for this visit.   Subjective Assessment - 07/31/20 0809    Subjective Pt reports her R shoulder was sore following her exercises yesterday. She feels it in her biceps and still has some soreness today. Reports the exercises have been going well otherwise.    Diagnostic tests R shoulder MRI 05/27/20: 1. Near complete full-thickness tear of the supraspinatus tendon with 2.6 cm of retraction of fibers. There is resultant high-riding humeral head and fluid in the subacromial-subdeltoid bursa.  2. Focal full-thickness tear of the superior subscapularis tendon.  3. Moderate subscapularis and infraspinatus tendinosis.  4. Mild-to-moderate glenohumeral joint chondral disease and a nondisplaced tear with degeneration of the posterosuperior labrum.    Patient Stated Goals Wants to avoid surgery; wants to be free of pain and be able to garden and do activities without this  pain    Currently in Pain? No/denies    Pain Score 0-No pain                             [facility] Adult PT Treatment/Exercise - 07/31/20 0808      Exercises   Exercises Shoulder      Shoulder Exercises: Standing   Horizontal ABduction Strengthening;Both;12 reps    Theraband Level (Shoulder Horizontal ABduction) Level 2 (Red)    Flexion Strengthening;AROM;Both;Weights;20 reps    Shoulder Flexion Weight (lbs) 1#    ABduction AROM;Strengthening;Both;20 reps;Weights    Shoulder ABduction Weight (lbs) 1#    ABduction Limitations Performed in scaption plane    Extension Strengthening;Both;12 reps    Theraband Level (Shoulder Extension) Level 2 (Red)    Row AROM;Strengthening;Both;12 reps    Theraband Level (Shoulder Row) Level 2 (Red)    Row Limitations Standing at doorway    Diagonals Strengthening;Right;12 reps;Theraband   1 x 12 each D1 Flex-Ext; D2 Flex-Ext   Theraband Level (Shoulder Diagonals) Level 1 (Yellow)    Diagonals Limitations Standing at doorway    Other Standing Exercises Cabinet reaches from shelf under counter to 2nd shelf with 4oz theraputy 1 x 10    Other Standing Exercises Wall Wash   2 x 30 seconds side to side; 2 x 30 seconds up and down; 1 x 30 seconds  CW; 1 x 30 seconds CCW;     Shoulder Exercises: ROM/Strengthening   UBE (Upper Arm Bike) L3 x 6 min (3 fwd/3 bkwd)                    PT Short Term Goals - 07/24/20 1045      PT SHORT TERM GOAL #1   Title Patient will be independent with initial HEP    Status Achieved    Target Date 07/24/20             PT Long Term Goals - 07/17/20 0900      PT LONG TERM GOAL #1   Title Patient will be independent with ongoing/advanced HEP for self-management at home    Status On-going    Target Date 08/14/20      PT LONG TERM GOAL #2   Title Improve posture and alignment with patient to demonstrate improved FHP and Rounded shoulders    Status On-going    Target Date 08/14/20      PT  LONG TERM GOAL #3   Title Patient will demonstrate improved R shoulder strength to >/= 4+/5 for functional UE use    Status On-going    Target Date 08/14/20      PT LONG TERM GOAL #4   Title Patient to report ability to perform gardening, ADL's and work-related tasks without limitation due to R shoulder pain    Status On-going    Target Date 08/14/20                 Plan - 07/31/20 0846    Clinical Impression Statement [name] reports some soreness in her R bicep following HEP yesterday and she still feels it some today. She can perform AROM in functional patterns with diagonals, wall washing, and cabinet reaches without pain and with proper form. She can perform shoulder flexion and abduction with 1 lb resistance without pain. She is ready to progress into further RC and scapular stabilization strengthening and will continue to benefit from skilled PT to improve her endurance for work and home activities.    Personal Factors and Comorbidities Comorbidity 3+;Time since onset of injury/illness/exacerbation;Past/Current Experience;Age    Comorbidities B carpal tunnel release, HTN, lumbar DJD, R knee pain, midfoot OA, L anterior tibialis pain/tendinitis, allergies    Examination-Activity Limitations Reach Overhead;Lift;Carry    Examination-Participation Restrictions Meal Prep;Occupation;Cleaning;Yard Work    Stability/Clinical Decision Making Stable/Uncomplicated    Rehab Potential Excellent    PT Frequency 2x / week    PT Duration 6 weeks    PT Treatment/Interventions ADLs/Self Care Home Management;Cryotherapy;Electrical Stimulation;Iontophoresis 4mg /ml Dexamethasone;Moist Heat;Ultrasound;Functional mobility training;Therapeutic activities;Therapeutic exercise;Neuromuscular re-education;Patient/family education;Manual techniques;Passive range of motion;Dry needling;Taping;Vasopneumatic Device;Joint Manipulations    PT Next Visit Plan Progress RC strengthening as tolerated; STM/DTM and  modalities PRN; Progress scapular stabilization; AROM and functional movements for R Shoulder with resistance; Prepare and reassess HEP for upcoming dc.    PT [place] Access Codes: [id number] 07/22/2022, updated 4/8); [id number] (4/13); [id number](4/19); [id number](9/21)    Consulted and Agree with Plan of Care Patient           Patient will benefit from skilled therapeutic intervention in order to improve the following deficits and impairments:  Decreased activity tolerance,Decreased strength,Increased fascial restricitons,Increased muscle spasms,Impaired perceived functional ability,Impaired flexibility,Impaired UE functional use,Postural dysfunction,Improper body mechanics,Pain,Impaired tone  Visit Diagnosis: Acute pain of right shoulder  Abnormal posture  Muscle weakness (generalized)  Other muscle spasm     Problem  List Patient Active Problem List   Diagnosis Date Noted  . Rotator cuff arthropathy of right shoulder 03/06/2020  . Lipoma of lower extremity 01/03/2020  . Patellofemoral arthritis of right knee 11/16/2019  . Acute left lumbar radiculopathy 09/14/2019  . Left tibialis posterior tendinitis 06/29/2019  . Hyperglycemia 10/05/2018  . Hypertension 05/10/2018  . Abnormality of gait 06/30/2016  . Arthritis of midfoot 06/02/2016  . PCP NOTES >>>> 03/18/2015  . CTS (carpal tunnel syndrome) 08/01/2014  . Eczema, ear canal 05/15/2014  . Eustachian tube dysfunction 04/08/2014  . Annual physical exam 02/19/2014  . DJD (degenerative joint disease) 01/15/2012  . Fatigue 10/31/2010  . Menopause 10/31/2010  . ALLERGIC RHINITIS 01/07/2007  . COMMON MIGRAINE 12/15/2006    Newman Nickels SPT 07/31/2020, 8:47 AM  [facility] [address]  [place], [place], [address] Phone: [phone]   Fax:  [phone]  Name: RHYSE SKOWRON MRN: [id number] Date of Birth: 1945/12/08

## 2020-08-02 ENCOUNTER — Other Ambulatory Visit: Payer: Self-pay

## 2020-08-02 ENCOUNTER — Encounter: Payer: Self-pay | Admitting: Physical Therapy

## 2020-08-02 ENCOUNTER — Ambulatory Visit: Payer: Medicare Other | Admitting: Physical Therapy

## 2020-08-02 DIAGNOSIS — M25511 Pain in right shoulder: Secondary | ICD-10-CM | POA: Diagnosis not present

## 2020-08-02 DIAGNOSIS — M62838 Other muscle spasm: Secondary | ICD-10-CM

## 2020-08-02 DIAGNOSIS — M6281 Muscle weakness (generalized): Secondary | ICD-10-CM | POA: Diagnosis not present

## 2020-08-02 DIAGNOSIS — R293 Abnormal posture: Secondary | ICD-10-CM

## 2020-08-02 NOTE — Patient Instructions (Signed)
      Access Code: [email or web address] URL: [email or web address] Date: 08/02/2020 Prepared by: Annie Paras  Exercises Standing Shoulder Row with Anchored Resistance - 1 x daily - 3-4 x weekly - 2 sets - 10 reps - 3 sec hold Shoulder External Rotation and Scapular Retraction with Resistance - 1 x daily - 3-4 x weekly - 2 sets - 10 reps - 3 hold Standing Single Arm Shoulder PNF D1 Flexion with Anchored Resistance - 1 x daily - 3-4 x weekly - 2 sets - 10 reps Standing Shoulder Single Arm PNF D2 Flexion with Resistance - 1 x daily - 3-4 x weekly - 2 sets - 10 reps Standing Shoulder Single Arm PNF D2 Extension with Anchored Resistance - 1 x daily - 3-4 x weekly - 2 sets - 10 reps Standing Single Arm Shoulder PNF D1 Extension with Anchored Resistance - 1 x daily - 3-4 x weekly - 2 sets - 10 reps Standing Shoulder Flexion to 90 Degrees with Dumbbells - 1 x daily - 3-4 x weekly - 2 sets - 10 reps Scaption with Dumbbells - 1 x daily - 3-4 x weekly - 2 sets - 10 reps Shoulder Extension with Resistance - 1 x daily - 3-4 x weekly - 2 sets - 10 reps - 3 hold

## 2020-08-02 NOTE — Therapy (Signed)
[facility] [address]  [place], [place], [address] Phone: [phone]   Fax:  [phone]  Physical Therapy Treatment  Patient Details  Name: Lori Goodwin MRN: [id number] Date of Birth: June 10, 1945 Referring Provider (PT): [name], DO   Encounter Date: 08/02/2020   PT End of Session - 08/02/20 0854    Visit Number 8    Number of Visits 12    Date for PT Re-Evaluation 08/14/20    PT Start Time 0848    PT Stop Time 0930    PT Time Calculation (min) 42 min    Activity Tolerance Patient tolerated treatment well    Behavior During Therapy [facility] for tasks assessed/performed           Past Medical History:  Diagnosis Date  . Allergic rhinitis   . Common migraine   . Hypertension     Past Surgical History:  Procedure Laterality Date  . CARPAL TUNNEL RELEASE Left 01/05/2016  . CARPAL TUNNEL RELEASE Right 04/2018  . TYMPANOSTOMY TUBE PLACEMENT      There were no vitals filed for this visit.   Subjective Assessment - 08/02/20 0851    Subjective Pt reports she is sleeping better and her R shoulder is not bothering her. Still some soreness in the anterior delt but that comes following exercises as expected. She performed the stretches yesterday and avoided the strengthening as advised becuase of treatment Wednesday.    Diagnostic tests R shoulder MRI 05/27/20: 1. Near complete full-thickness tear of the supraspinatus tendon with 2.6 cm of retraction of fibers. There is resultant high-riding humeral head and fluid in the subacromial-subdeltoid bursa.  2. Focal full-thickness tear of the superior subscapularis tendon.  3. Moderate subscapularis and infraspinatus tendinosis.  4. Mild-to-moderate glenohumeral joint chondral disease and a nondisplaced tear with degeneration of the posterosuperior labrum.    Patient Stated Goals Wants to avoid surgery; wants to be free of pain and be able to garden and do  activities without this pain    Currently in Pain? No/denies    Pain Score 0-No pain              [facility] PT Assessment - 08/02/20 0001      Strength   Right Shoulder Flexion 4/5    Right Shoulder ABduction 4+/5    Right Shoulder Internal Rotation 5/5    Right Shoulder External Rotation 4/5    Left Shoulder Flexion 5/5    Left Shoulder ABduction 5/5    Left Shoulder Internal Rotation 5/5    Left Shoulder External Rotation 5/5                         [facility] Adult PT Treatment/Exercise - 08/02/20 0850      Exercises   Exercises Shoulder      Shoulder Exercises: Seated   Other Seated Exercises Shoulder press; 1 x 10 AROM Flexion and Scaption      Shoulder Exercises: Standing   External Rotation Strengthening;AROM;Both;20 reps;Theraband    Theraband Level (Shoulder External Rotation) Level 2 (Red)    External Rotation Limitations Standing using theraband and both arms at same time. Scapular retraction added at the end of each rep.    Flexion Strengthening;AROM;Both;Weights;20 reps    Shoulder Flexion Weight (lbs) 1#    ABduction AROM;Strengthening;Both;Weights;10 reps    Shoulder ABduction Weight (lbs) 1#    ABduction Limitations Performed in scaption plane  Diagonals Strengthening;Right;12 reps;Theraband   1 x 12 each D1 Flex-Ext; D2 Flex-Ext   Theraband Level (Shoulder Diagonals) Level 1 (Yellow)    Diagonals Limitations Standing at doorway    Other Standing Exercises I's T's and Y's against the wall using a pool noodle to ensure correct form. 1 x 10 with 3 second holds each.      Shoulder Exercises: ROM/Strengthening   UBE (Upper Arm Bike) L3 x 6 min (3 fwd/3 bkwd)                  PT Education - 08/02/20 0933    Education Details HEP update with removing or progressing prior exercises code :[id number]    Person(s) Educated Patient    Methods Explanation;Demonstration;Tactile cues;Verbal cues;Handout    Comprehension Tactile cues required;Verbal  cues required;Returned demonstration;Verbalized understanding            PT Short Term Goals - 07/24/20 1045      PT SHORT TERM GOAL #1   Title Patient will be independent with initial HEP    Status Achieved    Target Date 07/24/20             PT Long Term Goals - 07/17/20 0900      PT LONG TERM GOAL #1   Title Patient will be independent with ongoing/advanced HEP for self-management at home    Status On-going    Target Date 08/14/20      PT LONG TERM GOAL #2   Title Improve posture and alignment with patient to demonstrate improved FHP and Rounded shoulders    Status On-going    Target Date 08/14/20      PT LONG TERM GOAL #3   Title Patient will demonstrate improved R shoulder strength to >/= 4+/5 for functional UE use    Status On-going    Target Date 08/14/20      PT LONG TERM GOAL #4   Title Patient to report ability to perform gardening, ADL's and work-related tasks without limitation due to R shoulder pain    Status On-going    Target Date 08/14/20                 Plan - 08/02/20 0923    Clinical Impression Statement Lori Goodwin reports she can sleep without pain in her R shoulder. She has been doing the HEP as instructed and only feels soreness in her R anterior deltoid following exercise. Her muscle strength has improved in her R shoulder. She shows understanding and good form with current HEP, and this was updated today. She was able to perform all exercises without pain and is ready to progress strengthening for endurance. She will continue to benefit from skilled PT to do so and is getting close to ready for D/C.    Personal Factors and Comorbidities Comorbidity 3+;Time since onset of injury/illness/exacerbation;Past/Current Experience;Age    Comorbidities B carpal tunnel release, HTN, lumbar DJD, R knee pain, midfoot OA, L anterior tibialis pain/tendinitis, allergies    Examination-Activity Limitations Reach Overhead;Lift;Carry     Examination-Participation Restrictions Meal Prep;Occupation;Cleaning;Yard Work    Stability/Clinical Decision Making Stable/Uncomplicated    Rehab Potential Excellent    PT Frequency 2x / week    PT Duration 6 weeks    PT Treatment/Interventions ADLs/Self Care Home Management;Cryotherapy;Electrical Stimulation;Iontophoresis 4mg /ml Dexamethasone;Moist Heat;Ultrasound;Functional mobility training;Therapeutic activities;Therapeutic exercise;Neuromuscular re-education;Patient/family education;Manual techniques;Passive range of motion;Dry needling;Taping;Vasopneumatic Device;Joint Manipulations    PT Next Visit Plan Reassess HEP understanding for possible DC, update as needed.Progress RC strengthening  as tolerated; Progress scapular stabilization; AROM and functional movements for R Shoulder with resistance;  STM/DTM and modalities PRN    PT Home Exercise Plan Medbridge Access Codes: [id number] July 26, 2022, updated 4/8); [id number] (4/13); [id number](7/37); [id number](5/46); [id number](3/81)    Consulted and Agree with Plan of Care Patient           Patient will benefit from skilled therapeutic intervention in order to improve the following deficits and impairments:  Decreased activity tolerance,Decreased strength,Increased fascial restricitons,Increased muscle spasms,Impaired perceived functional ability,Impaired flexibility,Impaired UE functional use,Postural dysfunction,Improper body mechanics,Pain,Impaired tone  Visit Diagnosis: Acute pain of right shoulder  Abnormal posture  Muscle weakness (generalized)  Other muscle spasm     Problem List Patient Active Problem List   Diagnosis Date Noted  . Rotator cuff arthropathy of right shoulder 03/06/2020  . Lipoma of lower extremity 01/03/2020  . Patellofemoral arthritis of right knee 11/16/2019  . Acute left lumbar radiculopathy 09/14/2019  . Left tibialis posterior tendinitis 06/29/2019  . Hyperglycemia 10/05/2018  . Hypertension 05/10/2018  .  Abnormality of gait 06/30/2016  . Arthritis of midfoot 06/02/2016  . PCP NOTES >>>> 03/18/2015  . CTS (carpal tunnel syndrome) 08/01/2014  . Eczema, ear canal 05/15/2014  . Eustachian tube dysfunction 04/08/2014  . Annual physical exam 02/19/2014  . DJD (degenerative joint disease) 01/15/2012  . Fatigue 10/31/2010  . Menopause 10/31/2010  . ALLERGIC RHINITIS 01/07/2007  . COMMON MIGRAINE 12/15/2006    Newman Nickels SPT 08/02/2020, 9:35 AM  [facility] [address]  [place], [place], [address] Phone: [phone]   Fax:  [phone]  Name: Lori Goodwin MRN: [id number] Date of Birth: 1945/10/01

## 2020-08-07 ENCOUNTER — Encounter: Payer: Self-pay | Admitting: Physical Therapy

## 2020-08-07 ENCOUNTER — Ambulatory Visit: Payer: Medicare Other | Attending: Family Medicine | Admitting: Physical Therapy

## 2020-08-07 ENCOUNTER — Other Ambulatory Visit: Payer: Self-pay

## 2020-08-07 DIAGNOSIS — M25511 Pain in right shoulder: Secondary | ICD-10-CM | POA: Diagnosis not present

## 2020-08-07 DIAGNOSIS — M6281 Muscle weakness (generalized): Secondary | ICD-10-CM | POA: Insufficient documentation

## 2020-08-07 DIAGNOSIS — M62838 Other muscle spasm: Secondary | ICD-10-CM | POA: Diagnosis not present

## 2020-08-07 DIAGNOSIS — R293 Abnormal posture: Secondary | ICD-10-CM | POA: Insufficient documentation

## 2020-08-07 NOTE — Therapy (Addendum)
[facility] [address]  [place], [place], [address] Phone: [phone]   Fax:  [phone]  Physical Therapy Treatment / Progress Note / Discharge Summary  Patient Details  Name: Lori Goodwin MRN: [id number] Date of Birth: 07/10/1945 Referring Provider (PT): [name], DO  Progress Note Reporting Period 07/03/2020  to 08/07/2020  See note below for Objective Data and Assessment of Progress/Goals.      Encounter Date: 08/07/2020   PT End of Session - 08/07/20 0853     Visit Number 9    Number of Visits 12    Date for PT Re-Evaluation 08/14/20    Authorization Type Medicare + BCBS    PT Start Time 0847    PT Stop Time 0929    PT Time Calculation (min) 42 min    Activity Tolerance Patient tolerated treatment well    Behavior During Therapy [facility] for tasks assessed/performed             Past Medical History:  Diagnosis Date   Allergic rhinitis    Common migraine    Hypertension     Past Surgical History:  Procedure Laterality Date   CARPAL TUNNEL RELEASE Left 01/05/2016   CARPAL TUNNEL RELEASE Right 04/2018   TYMPANOSTOMY TUBE PLACEMENT      There were no vitals filed for this visit.   Subjective Assessment - 08/07/20 0850     Subjective Pt reports her R shoulder is feeling much better. Does have some soreness following exercises but feels much better in the anterior delt and does not have any soreness.    Diagnostic tests R shoulder MRI 05/27/20: 1. Near complete full-thickness tear of the supraspinatus tendon with 2.6 cm of retraction of fibers. There is resultant high-riding humeral head and fluid in the subacromial-subdeltoid bursa.  2. Focal full-thickness tear of the superior subscapularis tendon.  3. Moderate subscapularis and infraspinatus tendinosis.  4. Mild-to-moderate glenohumeral joint chondral disease and a nondisplaced tear with degeneration of the posterosuperior labrum.     Patient Stated Goals Wants to avoid surgery; wants to be free of pain and be able to garden and do activities without this pain    Currently in Pain? No/denies    Pain Score 0-No pain                [facility] PT Assessment - 08/07/20 0858       Assessment   Medical Diagnosis Rotator cuff arthropathy of right shoulder    Referring Provider (PT) [name], DO    Onset Date/Surgical Date --   5-6 months   Hand Dominance Right    Next MD Visit --   PRN   Prior Therapy PT episodes for R knee pain, LBP & L LE radiculopathy June 2021 and previous episode for shoulder this year      Observation/Other Assessments   Focus on Therapeutic Outcomes (FOTO)  R Shoulder = 83      AROM   Right Shoulder Flexion 170 Degrees    Right Shoulder ABduction 180 Degrees    Right Shoulder Internal Rotation --   WFL to T7; L = R   Right Shoulder External Rotation --   WFL to T2; L = R     Strength   Right Shoulder Flexion 4+/5    Right Shoulder ABduction 4+/5    Right Shoulder Internal Rotation 5/5    Right Shoulder External Rotation 5/5  [facility] Adult PT Treatment/Exercise - 08/07/20 0848       Exercises   Exercises Shoulder      Shoulder Exercises: Seated   Other Seated Exercises Shoulder press; 1 x 10 Strengthening Flexion and Scaption   1#     Shoulder Exercises: Standing   Diagonals Strengthening;Right;Theraband;10 reps   1 x 12 each D1 Flex-Ext; D2 Flex-Ext   Theraband Level (Shoulder Diagonals) Level 1 (Yellow)    Diagonals Limitations Standing at doorway    Other Standing Exercises Cabinet reaches from both shelves under counter to 2nd shelf with 1# weight 1 x 10      Shoulder Exercises: Therapy Ball   Flexion Both;10 reps    Flexion Limitations Wall walk-ups; 3 sets, 1 x 10 strengthening at the end by taking right hand off the ball into further flexion.    Other Therapy Ball Exercises Rhythmic stabilization holding PBall against wall with  R hand and perturbations to the ball from PT 2 x 30 seconds      Shoulder Exercises: ROM/Strengthening   UBE (Upper Arm Bike) L3 x 6 min (3 fwd/3 bkwd)                      PT Short Term Goals - 07/24/20 1045       PT SHORT TERM GOAL #1   Title Patient will be independent with initial HEP    Status Achieved    Target Date 07/24/20               PT Long Term Goals - 08/07/20 0855       PT LONG TERM GOAL #1   Title Patient will be independent with ongoing/advanced HEP for self-management at home    Status Achieved   08/14/2020   Target Date 08/14/20      PT LONG TERM GOAL #2   Title Improve posture and alignment with patient to demonstrate improved FHP and Rounded shoulders    Status Achieved   08/07/2020   Target Date 08/14/20      PT LONG TERM GOAL #3   Title Patient will demonstrate improved R shoulder strength to >/= 4+/5 for functional UE use    Status Achieved   08/07/2020   Target Date 08/14/20      PT LONG TERM GOAL #4   Title Patient to report ability to perform gardening, ADL's and work-related tasks without limitation due to R shoulder pain    Status Achieved   08/07/2020   Target Date 08/14/20                   Plan - 08/07/20 0934     Clinical Impression Statement Lori Goodwin reports no pain in her R shoulder, and she has been able to perform more functional activities without problem. She has met all her STGs and LTGs and scored an 23 on the FOTO which exceeded predicted score of 67 at DC. Lori Goodwin was able to perform all exercises today without pain or an increase in tiredness. Her new plan for PT is a 30 day hold with the option to schedule visits as needed.    Personal Factors and Comorbidities Comorbidity 3+;Time since onset of injury/illness/exacerbation;Past/Current Experience;Age    Comorbidities B carpal tunnel release, HTN, lumbar DJD, R knee pain, midfoot OA, L anterior tibialis pain/tendinitis, allergies    Examination-Activity  Limitations Reach Overhead;Lift;Carry    Examination-Participation Restrictions Meal Prep;Occupation;Cleaning;Yard Work    Stability/Clinical Decision Making Stable/Uncomplicated  Rehab Potential Excellent    PT Treatment/Interventions ADLs/Self Care Home Management;Cryotherapy;Electrical Stimulation;Iontophoresis 75m/ml Dexamethasone;Moist Heat;Ultrasound;Functional mobility training;Therapeutic activities;Therapeutic exercise;Neuromuscular re-education;Patient/family education;Manual techniques;Passive range of motion;Dry needling;Taping;Vasopneumatic Device;Joint Manipulations    PT Next Visit Plan 30 day hold    PT Home Exercise Plan Medbridge Access Codes: [id number](11-Jul-2022 updated 4/8); [id number](4/13); [id number](6/59; [id number](9/39; [id number](0/07    Consulted and Agree with Plan of Care Patient             Patient will benefit from skilled therapeutic intervention in order to improve the following deficits and impairments:  Decreased activity tolerance,Decreased strength,Increased fascial restricitons,Increased muscle spasms,Impaired perceived functional ability,Impaired flexibility,Impaired UE functional use,Postural dysfunction,Improper body mechanics,Pain,Impaired tone  Visit Diagnosis: Acute pain of right shoulder  Abnormal posture  Muscle weakness (generalized)  Other muscle spasm     Problem List Patient Active Problem List   Diagnosis Date Noted   Rotator cuff arthropathy of right shoulder 03/06/2020   Lipoma of lower extremity 01/03/2020   Patellofemoral arthritis of right knee 11/16/2019   Acute left lumbar radiculopathy 09/14/2019   Left tibialis posterior tendinitis 06/29/2019   Hyperglycemia 10/05/2018   Hypertension 05/10/2018   Abnormality of gait 06/30/2016   Arthritis of midfoot 06/02/2016   PCP NOTES >>>> 03/18/2015   CTS (carpal tunnel syndrome) 08/01/2014   Eczema, ear canal 05/15/2014   Eustachian tube dysfunction 04/08/2014   Annual physical  exam 02/19/2014   DJD (degenerative joint disease) 01/15/2012   Fatigue 10/31/2010   Menopause 10/31/2010   ALLERGIC RHINITIS 01/07/2007   COMMON MIGRAINE 12/15/2006    SNewman NickelsSPT 08/07/2020, 9:46 AM  [facility] [place] [address]Phone: [phone]  Fax:  [phone] Name: TCHRISANN MELARAGNOMRN: [id number] of Birth: 5March 17, 1947  PHYSICAL THERAPY DISCHARGE SUMMARY  Visits from Start of Care: 9  Current functional level related to goals / functional outcomes:   Refer to above clinical impression for status as of last visit on 08/07/2020. Patient was placed on hold for 30 days and has not needed to return to PT, therefore will proceed with discharge from PT for this episode.   Remaining deficits:   As above.   Education / Equipment:   HEP   Patient agrees to discharge. Patient goals were met. Patient is being discharged due to meeting the stated rehab goals.   JPercival Spanish PT, MPT 10/02/20, 9:14 AM  [facility] [address]Phone: [phone]  Fax:  [phone]

## 2020-08-09 ENCOUNTER — Encounter: Payer: BC Managed Care – PPO | Admitting: Physical Therapy

## 2020-08-21 ENCOUNTER — Telehealth: Payer: Self-pay

## 2020-09-10 NOTE — Telephone Encounter (Signed)
Spoke with patient.

## 2021-01-07 ENCOUNTER — Ambulatory Visit (INDEPENDENT_AMBULATORY_CARE_PROVIDER_SITE_OTHER): Payer: Medicare Other | Admitting: Internal Medicine

## 2021-01-07 ENCOUNTER — Encounter: Payer: Self-pay | Admitting: Internal Medicine

## 2021-01-07 ENCOUNTER — Other Ambulatory Visit: Payer: Self-pay

## 2021-01-07 VITALS — BP 138/68 | HR 43 | Temp 98.3°F | Resp 16 | Ht 63.0 in | Wt 157.5 lb

## 2021-01-07 DIAGNOSIS — I10 Essential (primary) hypertension: Secondary | ICD-10-CM

## 2021-01-07 DIAGNOSIS — Z23 Encounter for immunization: Secondary | ICD-10-CM

## 2021-01-07 DIAGNOSIS — R739 Hyperglycemia, unspecified: Secondary | ICD-10-CM

## 2021-01-07 DIAGNOSIS — E785 Hyperlipidemia, unspecified: Secondary | ICD-10-CM | POA: Diagnosis not present

## 2021-01-07 LAB — CBC WITH DIFFERENTIAL/PLATELET
Basophils Absolute: 0 10*3/uL (ref 0.0–0.1)
Basophils Relative: 0.8 % (ref 0.0–3.0)
Eosinophils Absolute: 0.2 10*3/uL (ref 0.0–0.7)
Eosinophils Relative: 5.3 % — ABNORMAL HIGH (ref 0.0–5.0)
HCT: 40.3 % (ref 36.0–46.0)
Hemoglobin: 13.6 g/dL (ref 12.0–15.0)
Lymphocytes Relative: 45.2 % (ref 12.0–46.0)
Lymphs Abs: 2 10*3/uL (ref 0.7–4.0)
MCHC: 33.7 g/dL (ref 30.0–36.0)
MCV: 92.1 fl (ref 78.0–100.0)
Monocytes Absolute: 0.3 10*3/uL (ref 0.1–1.0)
Monocytes Relative: 6.9 % (ref 3.0–12.0)
Neutro Abs: 1.8 10*3/uL (ref 1.4–7.7)
Neutrophils Relative %: 41.8 % — ABNORMAL LOW (ref 43.0–77.0)
Platelets: 219 10*3/uL (ref 150.0–400.0)
RBC: 4.37 Mil/uL (ref 3.87–5.11)
RDW: 12.5 % (ref 11.5–15.5)
WBC: 4.3 10*3/uL (ref 4.0–10.5)

## 2021-01-07 LAB — HEMOGLOBIN A1C: Hgb A1c MFr Bld: 6.1 % (ref 4.6–6.5)

## 2021-01-07 LAB — ALT: ALT: 19 U/L (ref 0–35)

## 2021-01-07 LAB — AST: AST: 21 U/L (ref 0–37)

## 2021-01-07 MED ORDER — ATORVASTATIN CALCIUM 10 MG PO TABS: 10.0000 mg | ORAL_TABLET | Freq: Every day | ORAL | 4 refills | Status: DC

## 2021-01-07 NOTE — Patient Instructions (Addendum)
Recommend to proceed with the following vaccine at your pharmacy:  [place] booster   You are due for your repeat colonoscopy with Dr. Ardis Hughs. Please call his office at [phone] to get on his schedule.   I am glad you agreed to start atorvastatin 10 mg at bedtime.  Continue watching your diet closely  Check the  blood pressure monthly BP GOAL is between 110/65 and  135/85. If it is consistently higher or lower, let me know      GO TO THE LAB : Get the blood work     [place], [place] back for   blood work again in 6 weeks  Come back for a physical exam in 6 months

## 2021-01-07 NOTE — Progress Notes (Signed)
Subjective:    Patient ID: Lori Goodwin, female    DOB: 07/17/1945, 75 y.o.   MRN: [id number]  DOS:  01/07/2021 Type of visit - description: Routine checkup  Today with talk about hypertension, dyslipidemia, migraines, vaccinations. Heart rate was noted to be low, she denies fatigue, dizziness, fainting is or difficulty breathing.  No edema.   Review of Systems See above   Past Medical History:  Diagnosis Date   Allergic rhinitis    Common migraine    Hypertension     Past Surgical History:  Procedure Laterality Date   CARPAL TUNNEL RELEASE Left 01/05/2016   CARPAL TUNNEL RELEASE Right 04/2018   TYMPANOSTOMY TUBE PLACEMENT      Allergies as of 01/07/2021       Reactions   Tylenol [acetaminophen] Shortness Of Breath        Medication List        Accurate as of January 07, 2021 11:59 PM. If you have any questions, ask your nurse or doctor.          amLODipine 5 MG tablet Commonly known as: NORVASC Take 1 tablet (5 mg total) by mouth daily.   atorvastatin 10 MG tablet Commonly known as: LIPITOR Take 1 tablet (10 mg total) by mouth at bedtime. Started by: Kathlene November, MD   clobetasol 0.05 % external solution Commonly known as: TEMOVATE Apply topically daily as needed.   multivitamin with minerals Tabs tablet Take 1 tablet by mouth daily.   propranolol ER 60 MG 24 hr capsule Commonly known as: INDERAL LA Take 1 capsule (60 mg total) by mouth daily.   SUMAtriptan 50 MG tablet Commonly known as: IMITREX TAKE 1-2 TABLETS BY MOUTH AS NEEDED FOR MIGRAINE. MAY REPEAT IN 2 HOURS IF HEADACHE PERSISTS OR RECURS   Vitamin D 50 MCG (2000 UT) Caps Take 1 capsule by mouth.           Objective:   Physical Exam BP 138/68 (BP Location: Left Arm, Patient Position: Sitting, Cuff Size: Small)   Pulse (!) 43   Temp 98.3 F (36.8 C) (Oral)   Resp 16   Ht 5\' 3"  (1.6 m)   Wt 157 lb 8 oz (71.4 kg)   SpO2 98%   BMI 27.90 kg/m  General:   Well developed,  NAD, BMI noted. HEENT:  Normocephalic . Face symmetric, atraumatic Lungs:  CTA B Normal respiratory effort, no intercostal retractions, no accessory muscle use. Heart: RRR,  no murmur.  Lower extremities: no pretibial edema bilaterally  Skin: Not pale. Not jaundice Neurologic:  alert & oriented X3.  Speech normal, gait appropriate for age and unassisted Psych--  Cognition and judgment appear intact.  Cooperative with normal attention span and concentration.  Behavior appropriate. No anxious or depressed appearing.      Assessment     Assessment  Prediabetes: A1c 6.0 (03-2015) Dyslipidemia HTN (on BB) MSK: --DJD --Neck pain: Saw neurosurgery, MRI was done, DX DJD, nonsurgical candidate (2013 ) Osteopenia first  T score -1.2 (06/2016) rx ca and vit D  Migraines  (onn BB and imitrex) Ear canal eczema: topical steroids prn  PLAN: Prediabetes: Check A1c. Dyslipidemia: Current CV RF is 19%, previously reluctant to start statins, benefits discussed, we agreed to start atorvastatin 10 mg nightly, follow-up labs 6 weeks. HTN: On amlodipine and beta-blockers (BBs due to migraines), blood pressure is controlled, has some bradycardia but no symptoms.  No change Neck pain: Still has occasional neck pain, denies the need  to be assessed again. Shoulder pain: Since the last visit, developed shoulder pain, had PT. Vaccines: Flu shot today.  Had 4 COVID vaccines, last was about 3 months ago.  She does qualify for the new COVID-vaccine.  Patient aware, recommend to proceed at her convenience RTC 6 weeks labs RTC CPX 6 months    This visit occurred during the SARS-CoV-2 public health emergency.  Safety protocols were in place, including screening questions prior to the visit, additional usage of staff PPE, and extensive cleaning of exam room while observing appropriate contact time as indicated for disinfecting solutions.

## 2021-01-08 NOTE — Assessment & Plan Note (Signed)
Prediabetes: Check A1c. Dyslipidemia: Current CV RF is 19%, previously reluctant to start statins, benefits discussed, we agreed to start atorvastatin 10 mg nightly, follow-up labs 6 weeks. HTN: On amlodipine and beta-blockers (BBs due to migraines), blood pressure is controlled, has some bradycardia but no symptoms.  No change Neck pain: Still has occasional neck pain, denies the need to be assessed again. Shoulder pain: Since the last visit, developed shoulder pain, had PT. Vaccines: Flu shot today.  Had 4 COVID vaccines, last was about 3 months ago.  She does qualify for the new COVID-vaccine.  Patient aware, recommend to proceed at her convenience RTC 6 weeks labs RTC CPX 6 months

## 2021-02-03 ENCOUNTER — Other Ambulatory Visit (INDEPENDENT_AMBULATORY_CARE_PROVIDER_SITE_OTHER): Payer: Medicare Other

## 2021-02-03 ENCOUNTER — Other Ambulatory Visit: Payer: Self-pay

## 2021-02-03 DIAGNOSIS — E785 Hyperlipidemia, unspecified: Secondary | ICD-10-CM | POA: Diagnosis not present

## 2021-02-03 LAB — LIPID PANEL
Cholesterol: 126 mg/dL (ref 0–200)
HDL: 55.5 mg/dL (ref >39.00)
LDL Cholesterol: 56 mg/dL (ref 0–99)
NonHDL: 70.47
Total CHOL/HDL Ratio: 2
Triglycerides: 72 mg/dL (ref 0.0–149.0)
VLDL: 14.4 mg/dL (ref 0.0–40.0)

## 2021-02-03 LAB — AST: AST: 18 U/L (ref 0–37)

## 2021-02-03 LAB — ALT: ALT: 18 U/L (ref 0–35)

## 2021-02-25 ENCOUNTER — Telehealth: Payer: Self-pay | Admitting: Family Medicine

## 2021-02-25 NOTE — Telephone Encounter (Signed)
Patient called stating that she has been having increasing neck pain. She asked if there was therapy that Dr Tamala Julian would recommend or if she should make an appointment with Dr Tamala Julian? I offered to schedule but she wanted to ask first.  Please advise.

## 2021-03-04 NOTE — Telephone Encounter (Signed)
Awaiting MyChart message with name of PT that patient prefers.

## 2021-03-18 ENCOUNTER — Other Ambulatory Visit: Payer: Self-pay

## 2021-03-18 DIAGNOSIS — M12811 Other specific arthropathies, not elsewhere classified, right shoulder: Secondary | ICD-10-CM

## 2021-03-18 NOTE — Telephone Encounter (Signed)
Referral placed.  Left message for patient to call for time to pick up brace.

## 2021-03-19 NOTE — Telephone Encounter (Signed)
Pt will come by for the brace on 12/19 around 10am. Please call her if this is NOT OK.

## 2021-04-01 ENCOUNTER — Ambulatory Visit: Payer: BC Managed Care – PPO | Admitting: Physical Therapy

## 2021-04-05 ENCOUNTER — Other Ambulatory Visit: Payer: Self-pay | Admitting: Internal Medicine

## 2021-04-10 ENCOUNTER — Encounter: Payer: Self-pay | Admitting: Physical Therapy

## 2021-04-10 ENCOUNTER — Other Ambulatory Visit: Payer: Self-pay

## 2021-04-10 ENCOUNTER — Ambulatory Visit: Payer: Medicare PPO | Attending: Family Medicine | Admitting: Physical Therapy

## 2021-04-10 DIAGNOSIS — M6281 Muscle weakness (generalized): Secondary | ICD-10-CM | POA: Insufficient documentation

## 2021-04-10 DIAGNOSIS — M542 Cervicalgia: Secondary | ICD-10-CM | POA: Insufficient documentation

## 2021-04-10 DIAGNOSIS — M12811 Other specific arthropathies, not elsewhere classified, right shoulder: Secondary | ICD-10-CM | POA: Diagnosis not present

## 2021-04-10 DIAGNOSIS — M62838 Other muscle spasm: Secondary | ICD-10-CM | POA: Insufficient documentation

## 2021-04-10 DIAGNOSIS — R293 Abnormal posture: Secondary | ICD-10-CM | POA: Insufficient documentation

## 2021-04-10 NOTE — Therapy (Signed)
[facility] [address]  [place], [place], [address] Phone: [phone]   Fax:  [phone]  Physical Therapy Evaluation  Patient Details  Name: Lori Goodwin MRN: [id number] Date of Birth: [birth year] Referring Provider (PT): [name] DO   Encounter Date: 04/10/2021   PT End of Session - 04/10/21 1147     Visit Number 1    Number of Visits 12    Date for PT Re-Evaluation 05/22/21    Authorization Type [employer]    Progress Note Due on Visit 10    PT Start Time 1105    PT Stop Time 1135    PT Time Calculation (min) 30 min    Activity Tolerance Patient tolerated treatment well    Behavior During Therapy [facility] for tasks assessed/performed             Past Medical History:  Diagnosis Date   Allergic rhinitis    Common migraine    Hypertension     Past Surgical History:  Procedure Laterality Date   CARPAL TUNNEL RELEASE Left 01/05/2016   CARPAL TUNNEL RELEASE Right 04/2018   TYMPANOSTOMY TUBE PLACEMENT      There were no vitals filed for this visit.    Subjective Assessment - 04/10/21 1105     Subjective Patient reports she has a lot of R sided neck/upper shoulder pain.  She has had previous PT for R shoulder pain and has R RTC tear, she has been compliant with previous HEP (performs daily in morning), but at end of day she has a lot of upper shoulder/neck pain.  Also gets occasional Right sided headaches with neck pain.  She also has difficulty getting comfortable and sleeping, tends to alternate sides.  She feels her neck muscles aren't strong enough.  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    Pertinent History history of R RTC tear, HTN, DJD cervical spine    Diagnostic tests R shoulder MRI 05/27/20: 1. Near complete full-thickness tear of the supraspinatus tendon with 2.6 cm of retraction of fibers. There is resultant high-riding humeral head  and fluid in the subacromial-subdeltoid bursa.  2. Focal full-thickness tear of the superior subscapularis tendon.  3. Moderate subscapularis and infraspinatus tendinosis.  4. Mild-to-moderate glenohumeral joint chondral disease and a nondisplaced tear with degeneration of the posterosuperior labrum.    Patient Stated Goals "have some relief"    Currently in Pain? Yes    Pain Score 4    8-9/10 at end of day   Pain Location Shoulder    Pain Orientation Right    Pain Descriptors / Indicators Aching    Pain Type Acute pain;Chronic pain    Pain Radiating Towards Neck to Right shoulder    Pain Onset More than a month ago    Pain Frequency Constant    Aggravating Factors  end of day worst    Pain Relieving Factors heat and exercise    Effect of Pain on Daily Activities difficulty sleeping, headaches                [facility] PT Assessment - 04/10/21 0001       Assessment   Medical Diagnosis Rotator cuff arthropathy of right shoulder  - Primary M12.811    Referring Provider (PT) [name] DO    Onset Date/Surgical Date --   several months   Hand Dominance Right  Prior Therapy yes for R shoulder and LBP      Precautions   Precautions None      Restrictions   Weight Bearing Restrictions No      Balance Screen   Has the patient fallen in the past 6 months No    Has the patient had a decrease in activity level because of a fear of falling?  No    Is the patient reluctant to leave their home because of a fear of falling?  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      Posture/Postural Control   Posture/Postural Control Postural limitations    Postural Limitations Forward head;Rounded Shoulders      ROM / Strength   AROM / PROM / Strength AROM;Strength      AROM   Overall AROM  Deficits    Overall AROM Comments bil shoulder ROM WNL and symmetric, R  shoulder pain with AROM but history R rotator cuff tear    AROM Assessment Site Cervical    Cervical Flexion 20    Cervical Extension 30    Cervical - Right Rotation 50    Cervical - Left Rotation 40      Strength   Overall Strength Within functional limits for tasks performed    Overall Strength Comments 4+/5 bil UE strength, increased pain with resistance R shoulder (R RTC tear, not tested with max resistance)      Palpation   Spinal mobility hypomobile cervical and upper thoracic spine    Palpation comment Tenderness/trigger points bil UT (R>L), L/S (R>L), cervical paraspinals (R multifidi C3-C6)                        Objective measurements completed on examination: See above findings.                PT Education - 04/10/21 1146     Education Details education on findings, plan of care and discussion of dry needling as a possible intervention.    Person(s) Educated Patient    Methods Explanation;Handout    Comprehension Verbalized understanding              PT Short Term Goals - 04/10/21 1156       PT SHORT TERM GOAL #1   Title Pt. will complete FOTO for cervical region within 2 visits    Baseline unable to perform today due to time constraints    Time 2    Period Weeks    Status New    Target Date 04/24/21      PT SHORT TERM GOAL #2   Title Patient will be independent with initial HEP    Baseline unable to issue today due to time constraints    Time 2    Period Weeks    Status New  Target Date 04/24/21                PT Long Term Goals - 04/10/21 1202       PT LONG TERM GOAL #1   Title Patient will be independent with ongoing/advanced HEP for self-management at home    Time 6    Period Weeks    Status New    Target Date 05/22/21      PT LONG TERM GOAL #2   Title Improve posture and alignment with patient to demonstrate improved FHP and Rounded shoulders    Time 6    Period Weeks    Status New    Target Date 05/22/21      PT LONG TERM GOAL #3   Title Pt. will demonstrate improved cervical ROM by at least 10 deg all direction for safety with driving.    Baseline 30 deg ext, 20 deg flexion, 40 deg L rotation, 50 deg R rotation    Time 6    Period Weeks    Status New    Target Date 05/22/21      PT LONG TERM GOAL #4   Title Pt. will report 75% improvement in R neck/upper shoulder pain.    Time 6    Period Weeks    Status New    Target Date 05/22/21                    Plan - 04/10/21 1148     Clinical Impression Statement [name] is a 76 year old female referred for R sided upper shoulder/neck pain that increases throughout the day.  She demonstrates significant deficits in cervical ROM and tightness/trigger points throughout R UT/levator scapulae and cervical paraspinals.  She would benefit from skilled physical therapy to decrease pain, headache,  improve cervical ROM, and improve posture in order to improve QOL.    Personal Factors and Comorbidities Comorbidity 3+    Comorbidities DJD cervical spine, R RTC tear, HTN, migraines    Examination-Activity Limitations Sleep    Examination-Participation Restrictions Occupation;Yard Work;Driving    Stability/Clinical Decision Making Evolving/Moderate complexity    Clinical Decision Making Moderate    Rehab Potential Excellent    PT Frequency 2x / week    PT Duration 6 weeks   decreasing to 1x/week as appropriate   PT Treatment/Interventions ADLs/Self Care Home Management;Cryotherapy;Electrical  Stimulation;Iontophoresis 4mg /ml Dexamethasone;Moist Heat;Traction;Ultrasound;Therapeutic activities;Therapeutic exercise;Neuromuscular re-education;Patient/family education;Passive range of motion;Dry needling;Taping;Spinal Manipulations;Joint Manipulations    PT Next Visit Plan initiate HEP for postural strengthening, manual therapy and modalities including dry needling as approriate    Consulted and Agree with Plan of Care Patient             Patient will benefit from skilled therapeutic intervention in order to improve the following deficits and impairments:  Decreased range of motion, Decreased strength, Hypomobility, Increased fascial restricitons, Pain, Postural dysfunction, Increased muscle spasms  Visit Diagnosis: Cervicalgia  Abnormal posture  Muscle weakness (generalized)  Other muscle spasm     Problem List Patient Active Problem List   Diagnosis Date Noted   Rotator cuff arthropathy of right shoulder 03/06/2020   Lipoma of lower extremity 01/03/2020   Patellofemoral arthritis of right knee 11/16/2019   Acute left lumbar radiculopathy 09/14/2019   Left tibialis posterior tendinitis 06/29/2019   Hyperglycemia 10/05/2018   Hypertension 05/10/2018   Abnormality of gait 06/30/2016   Arthritis of midfoot 06/02/2016   PCP NOTES >>>> 03/18/2015   CTS (carpal tunnel  syndrome) 08/01/2014   Eczema, ear canal 05/15/2014   Eustachian tube dysfunction 04/08/2014   Annual physical exam 02/19/2014   DJD (degenerative joint disease) 01/15/2012   Fatigue 10/31/2010   Menopause 10/31/2010   ALLERGIC RHINITIS 01/07/2007   COMMON MIGRAINE 12/15/2006    Rennie Natter, PT, DPT  04/10/2021, 12:05 PM  [facility] [address]  [place], [place], [address] Phone: [phone]   Fax:  [phone]  Name: Lori Goodwin MRN: [id number] Date of Birth: 02-22-1946

## 2021-04-16 ENCOUNTER — Ambulatory Visit: Payer: Medicare PPO | Admitting: Physical Therapy

## 2021-04-16 ENCOUNTER — Encounter: Payer: Self-pay | Admitting: Physical Therapy

## 2021-04-16 ENCOUNTER — Other Ambulatory Visit: Payer: Self-pay

## 2021-04-16 DIAGNOSIS — M6281 Muscle weakness (generalized): Secondary | ICD-10-CM

## 2021-04-16 DIAGNOSIS — M62838 Other muscle spasm: Secondary | ICD-10-CM

## 2021-04-16 DIAGNOSIS — M542 Cervicalgia: Secondary | ICD-10-CM | POA: Diagnosis not present

## 2021-04-16 DIAGNOSIS — R293 Abnormal posture: Secondary | ICD-10-CM

## 2021-04-16 NOTE — Patient Instructions (Signed)
Access Code: [email or web address] URL: [email or web address] Date: 04/16/2021 Prepared by: Glenetta Hew  Exercises Seated Cervical Retraction - 3 x daily - 7 x weekly - 1 sets - 10 reps - 3 sec hold Seated Scapular Retraction - 3 x daily - 7 x weekly - 1 sets - 10 reps - 3 sec hold Seated Shoulder Rolls - 3 x daily - 7 x weekly - 1 sets - 10 reps Seated Levator Scapulae Stretch - 1 x daily - 7 x weekly - 1 sets - 3 reps - 10 sec hold Seated Assisted Cervical Rotation with Towel - 1 x daily - 7 x weekly - 1 sets - 5 reps Cervical Extension AROM with Strap - 1 x daily - 7 x weekly - 1 sets - 5 reps Mid-Lower Cervical Extension SNAG with Strap - 1 x daily - 7 x weekly - 1 sets - 5 reps Standing Shoulder Row with Anchored Resistance - 1 x daily - 7 x weekly - 2 sets - 10 reps Shoulder extension with resistance - Neutral - 1 x daily - 7 x weekly - 2 sets - 10 reps

## 2021-04-16 NOTE — Therapy (Signed)
[facility] [address]  [place], [place], [address] Phone: [phone]   Fax:  [phone]  Physical Therapy Evaluation  Patient Details  Name: Lori Goodwin MRN: [id number] Date of Birth: May 05, 1945 Referring Provider (PT): [name] DO   Encounter Date: 04/16/2021   PT End of Session - 04/16/21 1302     Visit Number 2    Number of Visits 12    Date for PT Re-Evaluation 05/22/21    Authorization Type [employer] Medicare    Progress Note Due on Visit 10    PT Start Time [phone]    PT Stop Time 1016    PT Time Calculation (min) 40 min    Activity Tolerance Patient tolerated treatment well    Behavior During Therapy [facility] for tasks assessed/performed             Past Medical History:  Diagnosis Date   Allergic rhinitis    Common migraine    Hypertension     Past Surgical History:  Procedure Laterality Date   CARPAL TUNNEL RELEASE Left 01/05/2016   CARPAL TUNNEL RELEASE Right 04/2018   TYMPANOSTOMY TUBE PLACEMENT      There were no vitals filed for this visit.    Subjective Assessment - 04/16/21 0938     Subjective Patient reports she feels much better after manual therapy by therapist and then therapeutic massage.    Pertinent History history of R RTC tear, HTN, DJD cervical spine    Diagnostic tests R shoulder MRI 05/27/20: 1. Near complete full-thickness tear of the supraspinatus tendon with 2.6 cm of retraction of fibers. There is resultant high-riding humeral head and fluid in the subacromial-subdeltoid bursa.  2. Focal full-thickness tear of the superior subscapularis tendon.  3. Moderate subscapularis and infraspinatus tendinosis.  4. Mild-to-moderate glenohumeral joint chondral disease and a nondisplaced tear with degeneration of the posterosuperior labrum.    Patient Stated Goals "have some relief"    Currently in Pain? Yes    Pain Score 3     Pain Location Shoulder    Pain  Orientation Right    Pain Onset More than a month ago                [facility] PT Assessment - 04/16/21 0001       Observation/Other Assessments   Focus on Therapeutic Outcomes (FOTO)  cervical 44%.  57% predicted after 11 visits.                        Objective measurements completed on examination: See above findings.       [facility] Adult PT Treatment/Exercise - 04/16/21 0001       Exercises   Exercises Neck      Neck Exercises: Machines for Strengthening   UBE (Upper Arm Bike) L1 x 6 min (18f/3b)      Neck Exercises: Theraband   Shoulder Extension 20 reps;Red    Shoulder Extension Limitations cues for technique    Rows 20 reps;Red    Rows Limitations cues for technique      Neck Exercises: Seated   Neck Retraction 5 reps    Neck Retraction Limitations cues    Shoulder Rolls Backwards;10 reps      Neck Exercises: Stretches   Levator Stretch Right;Left;2 reps;10 seconds    Other Neck Stretches self snags with pillow case - extension and rotation      Manual Therapy  Manual Therapy Joint mobilization;Soft tissue mobilization;Myofascial release    Manual therapy comments supine, to improve cervical ROM and decrease pain    Joint Mobilization PA mobs cervical spine grade 2-3    Soft tissue mobilization STM to cervical paraspinals    Myofascial Release suboccipital release                       PT Short Term Goals - 04/16/21 1304       PT SHORT TERM GOAL #1   Title Pt. will complete FOTO for cervical region within 2 visits    Baseline unable to perform today due to time constraints    Time 2    Period Weeks    Status Achieved   04/16/21- 44% cervical   Target Date 04/24/21      PT SHORT TERM GOAL #2   Title Patient will be independent with initial HEP    Baseline unable to issue today due to time constraints    Time 2    Period Weeks    Status On-going   04/16/21- issued   Target Date 04/24/21               PT Long Term  Goals - 04/16/21 1304       PT LONG TERM GOAL #1   Title Patient will be independent with ongoing/advanced HEP for self-management at home    Time 6    Period Weeks    Status On-going    Target Date 05/22/21      PT LONG TERM GOAL #2   Title Improve posture and alignment with patient to demonstrate improved FHP and Rounded shoulders    Time 6    Period Weeks    Status On-going    Target Date 05/22/21      PT LONG TERM GOAL #3   Title Pt. will demonstrate improved cervical ROM by at least 10 deg all direction for safety with driving.    Baseline 30 deg ext, 20 deg flexion, 40 deg L rotation, 50 deg R rotation    Time 6    Period Weeks    Status On-going    Target Date 05/22/21      PT LONG TERM GOAL #4   Title Pt. will report 75% improvement in R neck/upper shoulder pain.    Time 6    Period Weeks    Status On-going    Target Date 05/22/21                    Plan - 04/16/21 1302     Clinical Impression Statement Focus of today's interventions was on establishing HEP for postural strengthening as well as neck stretches to decrease pain.  She responded well to all exercises and given HEP and red theraband.  Reported decreased muscle spasm and pain following manual therapy, declined modalities.    Personal Factors and Comorbidities Comorbidity 3+    Comorbidities DJD cervical spine, R RTC tear, HTN, migraines    Examination-Activity Limitations Sleep    Examination-Participation Restrictions Occupation;Yard Work;Driving    Stability/Clinical Decision Making Evolving/Moderate complexity    Rehab Potential Excellent    PT Frequency 2x / week    PT Duration 6 weeks   decreasing to 1x/week as appropriate   PT Treatment/Interventions ADLs/Self Care Home Management;Cryotherapy;Electrical Stimulation;Iontophoresis 4mg /ml Dexamethasone;Moist Heat;Traction;Ultrasound;Therapeutic activities;Therapeutic exercise;Neuromuscular re-education;Patient/family education;Passive range  of motion;Dry needling;Taping;Spinal Manipulations;Joint Manipulations    PT Next Visit Plan initiate HEP for  postural strengthening, manual therapy and modalities including dry needling as approriate    Consulted and Agree with Plan of Care Patient             Patient will benefit from skilled therapeutic intervention in order to improve the following deficits and impairments:  Decreased range of motion, Decreased strength, Hypomobility, Increased fascial restricitons, Pain, Postural dysfunction, Increased muscle spasms  Visit Diagnosis: Cervicalgia  Abnormal posture  Muscle weakness (generalized)  Other muscle spasm     Problem List Patient Active Problem List   Diagnosis Date Noted   Rotator cuff arthropathy of right shoulder 03/06/2020   Lipoma of lower extremity 01/03/2020   Patellofemoral arthritis of right knee 11/16/2019   Acute left lumbar radiculopathy 09/14/2019   Left tibialis posterior tendinitis 06/29/2019   Hyperglycemia 10/05/2018   Hypertension 05/10/2018   Abnormality of gait 06/30/2016   Arthritis of midfoot 06/02/2016   PCP NOTES >>>> 03/18/2015   CTS (carpal tunnel syndrome) 08/01/2014   Eczema, ear canal 05/15/2014   Eustachian tube dysfunction 04/08/2014   Annual physical exam 02/19/2014   DJD (degenerative joint disease) 01/15/2012   Fatigue 10/31/2010   Menopause 10/31/2010   ALLERGIC RHINITIS 01/07/2007   COMMON MIGRAINE 12/15/2006    Rennie Natter, PT, DPT  04/16/2021, 1:06 PM  [facility] [address]  [place], [place], [address] Phone: [phone]   Fax:  [phone]  Name: ALETHA ALLEBACH MRN: [id number] Date of Birth: 12/25/1945

## 2021-04-23 ENCOUNTER — Encounter: Payer: Medicare Other | Admitting: Physical Therapy

## 2021-04-29 ENCOUNTER — Other Ambulatory Visit: Payer: Self-pay

## 2021-04-29 ENCOUNTER — Encounter: Payer: Self-pay | Admitting: Physical Therapy

## 2021-04-29 ENCOUNTER — Ambulatory Visit: Payer: Medicare PPO | Admitting: Physical Therapy

## 2021-04-29 DIAGNOSIS — M542 Cervicalgia: Secondary | ICD-10-CM | POA: Diagnosis not present

## 2021-04-29 DIAGNOSIS — R293 Abnormal posture: Secondary | ICD-10-CM

## 2021-04-29 DIAGNOSIS — M62838 Other muscle spasm: Secondary | ICD-10-CM

## 2021-04-29 DIAGNOSIS — M6281 Muscle weakness (generalized): Secondary | ICD-10-CM

## 2021-04-29 NOTE — Therapy (Signed)
[facility] [address]  [place], [place], [address] Phone: [phone]   Fax:  [phone]  Physical Therapy Treatment  Patient Details  Name: Lori Goodwin MRN: [id number] Date of Birth: 1945-05-20 Referring Provider (PT): Lori Pulley DO   Encounter Date: 04/29/2021   PT End of Session - 04/29/21 0810     Visit Number 3    Number of Visits 12    Date for PT Re-Evaluation 05/22/21    Authorization Type [employer]    Progress Note Due on Visit 10    PT Start Time 0806    PT Stop Time 0849    PT Time Calculation (min) 43 min    Activity Tolerance Patient tolerated treatment well    Behavior During Therapy [facility] for tasks assessed/performed             Past Medical History:  Diagnosis Date   Allergic rhinitis    Common migraine    Hypertension     Past Surgical History:  Procedure Laterality Date   CARPAL TUNNEL RELEASE Left 01/05/2016   CARPAL TUNNEL RELEASE Right 04/2018   TYMPANOSTOMY TUBE PLACEMENT      There were no vitals filed for this visit.   Subjective Assessment - 04/29/21 0809     Subjective Patient reports that she has been doing her exercises, notices improvement - less pain and better sleep.  Has not had to take tylenol since the last session.    Pertinent History history of R RTC tear, HTN, DJD cervical spine    Diagnostic tests R shoulder MRI 05/27/20: 1. Near complete full-thickness tear of the supraspinatus tendon with 2.6 cm of retraction of fibers. There is resultant high-riding humeral head and fluid in the subacromial-subdeltoid bursa.  2. Focal full-thickness tear of the superior subscapularis tendon.  3. Moderate subscapularis and infraspinatus tendinosis.  4. Mild-to-moderate glenohumeral joint chondral disease and a nondisplaced tear with degeneration of the posterosuperior labrum.    Patient Stated Goals "have some relief"    Currently in Pain? Yes    Pain  Score 3     Pain Location Shoulder    Pain Orientation Right    Pain Onset More than a month ago                               [facility] Adult PT Treatment/Exercise - 04/29/21 0001       Exercises   Exercises Neck      Neck Exercises: Machines for Strengthening   UBE (Upper Arm Bike) L1 x 6 min (69f/3b)      Neck Exercises: Theraband   Shoulder Extension 20 reps;Red    Shoulder Extension Limitations cues for technique    Rows 20 reps;Red    Rows Limitations cues for technique    Shoulder External Rotation 20 reps;Red    Shoulder External Rotation Limitations bilateral, TB anchored      Neck Exercises: Standing   Wall Push Ups 20 reps    Wall Push Ups Limitations 2 x 10 cues for technique    Other Standing Exercises standing thoracic open books x 10 bil      Manual Therapy   Manual Therapy Joint mobilization;Soft tissue mobilization;Myofascial release    Manual therapy comments supine, to improve cervical ROM and decrease pain    Joint Mobilization PA mobs cervical spine grade 2-3    Soft tissue  mobilization STM to cervical paraspinals    Myofascial Release suboccipital release                     PT Education - 04/29/21 0856     Education Details progressed HEP Access Code: [email or web address]    Methods Explanation;Handout;Verbal cues    Comprehension Verbalized understanding;Returned demonstration              PT Short Term Goals - 04/29/21 1234       PT SHORT TERM GOAL #1   Title Pt. will complete FOTO for cervical region within 2 visits    Baseline unable to perform today due to time constraints    Time 2    Period Weeks    Status Achieved   04/16/21- 44% cervical   Target Date 04/24/21      PT SHORT TERM GOAL #2   Title Patient will be independent with initial HEP    Baseline unable to issue today due to time constraints    Time 2    Period Weeks    Status Achieved   04/16/21- issued   Target Date 04/24/21               PT  Long Term Goals - 04/16/21 1304       PT LONG TERM GOAL #1   Title Patient will be independent with ongoing/advanced HEP for self-management at home    Time 6    Period Weeks    Status On-going    Target Date 05/22/21      PT LONG TERM GOAL #2   Title Improve posture and alignment with patient to demonstrate improved FHP and Rounded shoulders    Time 6    Period Weeks    Status On-going    Target Date 05/22/21      PT LONG TERM GOAL #3   Title Pt. will demonstrate improved cervical ROM by at least 10 deg all direction for safety with driving.    Baseline 30 deg ext, 20 deg flexion, 40 deg L rotation, 50 deg R rotation    Time 6    Period Weeks    Status On-going    Target Date 05/22/21      PT LONG TERM GOAL #4   Title Pt. will report 75% improvement in R neck/upper shoulder pain.    Time 6    Period Weeks    Status On-going    Target Date 05/22/21                   Plan - 04/29/21 1229     Clinical Impression Statement Lori Goodwin is making good progress, reporting decreased neck pain and improved sleep.  Today reviewed and progressed exercises, followed by manual therapy to decrease tightness and improve cervical ROM.   We also discussed trial of dry needling, educated on intervention and given hand out today, she will consider.    Personal Factors and Comorbidities Comorbidity 3+    Comorbidities DJD cervical spine, R RTC tear, HTN, migraines    Examination-Activity Limitations Sleep    Examination-Participation Restrictions Occupation;Yard Work;Driving    Stability/Clinical Decision Making Evolving/Moderate complexity    Rehab Potential Excellent    PT Frequency 2x / week    PT Duration 6 weeks   decreasing to 1x/week as appropriate   PT Treatment/Interventions ADLs/Self Care Home Management;Cryotherapy;Electrical Stimulation;Iontophoresis 4mg /ml Dexamethasone;Moist Heat;Traction;Ultrasound;Therapeutic activities;Therapeutic exercise;Neuromuscular  re-education;Patient/family education;Passive range of motion;Dry needling;Taping;Spinal Manipulations;Joint Manipulations  PT Next Visit Plan initiate HEP for postural strengthening, manual therapy and modalities including dry needling as approriate    Consulted and Agree with Plan of Care Patient             Patient will benefit from skilled therapeutic intervention in order to improve the following deficits and impairments:  Decreased range of motion, Decreased strength, Hypomobility, Increased fascial restricitons, Pain, Postural dysfunction, Increased muscle spasms  Visit Diagnosis: Cervicalgia  Abnormal posture  Muscle weakness (generalized)  Other muscle spasm     Problem List Patient Active Problem List   Diagnosis Date Noted   Rotator cuff arthropathy of right shoulder 03/06/2020   Lipoma of lower extremity 01/03/2020   Patellofemoral arthritis of right knee 11/16/2019   Acute left lumbar radiculopathy 09/14/2019   Left tibialis posterior tendinitis 06/29/2019   Hyperglycemia 10/05/2018   Hypertension 05/10/2018   Abnormality of gait 06/30/2016   Arthritis of midfoot 06/02/2016   PCP NOTES >>>> 03/18/2015   CTS (carpal tunnel syndrome) 08/01/2014   Eczema, ear canal 05/15/2014   Eustachian tube dysfunction 04/08/2014   Annual physical exam 02/19/2014   DJD (degenerative joint disease) 01/15/2012   Fatigue 10/31/2010   Menopause 10/31/2010   ALLERGIC RHINITIS 01/07/2007   COMMON MIGRAINE 12/15/2006    Rennie Natter, PT, DPT  04/29/2021, 12:35 PM  [facility] [address]  [place], [place], [address] Phone: [phone]   Fax:  [phone]  Name: Lori Goodwin MRN: [id number] Date of Birth: Aug 05, 1945

## 2021-04-29 NOTE — Patient Instructions (Signed)
Access Code: [email or web address] URL: [email or web address] Date: 04/29/2021 Prepared by: Glenetta Hew  Exercises Wall Push Up - 1 x daily - 7 x weekly - 2 sets - 10 reps Standing Thoracic Open Book at [place] 1 x daily - 7 x weekly - 2 sets - 10 reps Shoulder External Rotation and Scapular Retraction with Resistance - 1 x daily - 7 x weekly - 2 sets - 10 reps

## 2021-05-06 ENCOUNTER — Telehealth: Payer: Self-pay | Admitting: Internal Medicine

## 2021-05-06 NOTE — Telephone Encounter (Signed)
Pt would like for cma to return her call regarding possible uti symps. Please advise.

## 2021-05-06 NOTE — Telephone Encounter (Signed)
Spoke w/ Pt- she has been very fatigued, urinating frequently since returning to [place] after visiting [place] in June 2022- she states she has been waiting for these problems to resolve themselves but has not. Informed that she should come in for PCP to evaluate her. Appt scheduled 05/07/21 at 9am.

## 2021-05-07 ENCOUNTER — Ambulatory Visit: Payer: Medicare PPO | Admitting: Physical Therapy

## 2021-05-07 ENCOUNTER — Ambulatory Visit: Payer: Medicare PPO | Admitting: Internal Medicine

## 2021-06-08 ENCOUNTER — Other Ambulatory Visit: Payer: Self-pay | Admitting: Internal Medicine

## 2021-06-20 ENCOUNTER — Encounter: Payer: Self-pay | Admitting: Internal Medicine

## 2021-07-10 ENCOUNTER — Ambulatory Visit (INDEPENDENT_AMBULATORY_CARE_PROVIDER_SITE_OTHER): Payer: Medicare PPO | Admitting: Internal Medicine

## 2021-07-10 ENCOUNTER — Encounter: Payer: Self-pay | Admitting: Internal Medicine

## 2021-07-10 VITALS — BP 140/70 | HR 59 | Temp 97.9°F | Ht 62.0 in | Wt 160.4 lb

## 2021-07-10 DIAGNOSIS — R739 Hyperglycemia, unspecified: Secondary | ICD-10-CM | POA: Diagnosis not present

## 2021-07-10 DIAGNOSIS — M8580 Other specified disorders of bone density and structure, unspecified site: Secondary | ICD-10-CM

## 2021-07-10 DIAGNOSIS — E785 Hyperlipidemia, unspecified: Secondary | ICD-10-CM

## 2021-07-10 DIAGNOSIS — I10 Essential (primary) hypertension: Secondary | ICD-10-CM | POA: Diagnosis not present

## 2021-07-10 DIAGNOSIS — Z0000 Encounter for general adult medical examination without abnormal findings: Secondary | ICD-10-CM

## 2021-07-10 LAB — COMPREHENSIVE METABOLIC PANEL
ALT: 18 U/L (ref 0–35)
AST: 21 U/L (ref 0–37)
Albumin: 4.3 g/dL (ref 3.5–5.2)
Alkaline Phosphatase: 70 U/L (ref 39–117)
BUN: 15 mg/dL (ref 6–23)
CO2: 29 mEq/L (ref 19–32)
Calcium: 9.6 mg/dL (ref 8.4–10.5)
Chloride: 106 mEq/L (ref 96–112)
Creatinine, Ser: 0.72 mg/dL (ref 0.40–1.20)
GFR: 82.07 mL/min (ref >60.00)
Glucose, Bld: 111 mg/dL — ABNORMAL HIGH (ref 70–99)
Potassium: 4.5 mEq/L (ref 3.5–5.1)
Sodium: 141 mEq/L (ref 135–145)
Total Bilirubin: 0.4 mg/dL (ref 0.2–1.2)
Total Protein: 6.5 g/dL (ref 6.0–8.3)

## 2021-07-10 LAB — LIPID PANEL
Cholesterol: 116 mg/dL (ref 0–200)
HDL: 52 mg/dL (ref >39.00)
LDL Cholesterol: 53 mg/dL (ref 0–99)
NonHDL: 64.01
Total CHOL/HDL Ratio: 2
Triglycerides: 56 mg/dL (ref 0.0–149.0)
VLDL: 11.2 mg/dL (ref 0.0–40.0)

## 2021-07-10 LAB — HEMOGLOBIN A1C: Hgb A1c MFr Bld: 6.3 % (ref 4.6–6.5)

## 2021-07-10 NOTE — Progress Notes (Signed)
? ?Subjective:  ? ? Patient ID: Lori Goodwin, female    DOB: Nov 07, 1945, 76 y.o.   MRN: [id number] ? ?DOS:  07/10/2021 ?Type of visit - description: cpx ?Since the last office visit is doing well and has no major concerns ? ? ?Review of Systems ?  ? ?A 14 point review of systems is negative  ? ? ?Past Medical History:  ?Diagnosis Date  ? Allergic rhinitis   ? Common migraine   ? Hypertension   ? ? ?Past Surgical History:  ?Procedure Laterality Date  ? CARPAL TUNNEL RELEASE Left 01/05/2016  ? CARPAL TUNNEL RELEASE Right 04/2018  ? TYMPANOSTOMY TUBE PLACEMENT    ? ?Social History  ? ?Socioeconomic History  ? Marital status: Married  ?  Spouse name: Not on file  ? Number of children: 3  ? Years of education: Not on file  ? Highest education level: Not on file  ?Occupational History  ? Occupation: [name], 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  ?  Employer: [employer]  ? Occupation: still works part time as off 07-2020  ?Tobacco Use  ? Smoking status: Never  ? Smokeless tobacco: Never  ?Substance and Sexual Activity  ? Alcohol use: No  ? Drug use: No  ? Sexual activity: Not on file  ?Other Topics Concern  ? Not on file  ?Social History Narrative  ? Original from [place]  ? Divorced, remarried  ? Lives w/ husband  ? [name] her son lives in [place]    ? 3 children ,1 son is paraplegic , has another that is a MD,other a PA    ? ?Social Determinants of Health  ? ?Financial Resource Strain: Not on file  ?Food Insecurity: Not on file  ?Transportation Needs: Not on file  ?Physical Activity: Not on file  ?Stress: Not on file  ?Social Connections: Not on file  ?Intimate Partner Violence: Not on file  ? ? ?Current Outpatient Medications  ?Medication Instructions  ? amLODipine (NORVASC) 5 MG tablet TAKE 1 TABLET BY MOUTH ONCE DAILY  ? atorvastatin (LIPITOR) 10 MG tablet TAKE 1 TABLET(10 MG) BY MOUTH AT BEDTIME  ? Cholecalciferol (VITAMIN D) 2000 units CAPS 1 capsule, Oral  ? clobetasol (TEMOVATE) 0.05 % external solution Topical, Daily  PRN  ? Multiple Vitamin (MULTIVITAMIN WITH MINERALS) TABS 1 tablet, Daily  ? propranolol ER (INDERAL LA) 60 MG 24 hr capsule TAKE 1 CAPSULE BY MOUTH EVERY DAY  ? SUMAtriptan (IMITREX) 50 MG tablet TAKE 1-2 TABLETS BY MOUTH AS NEEDED FOR MIGRAINE. MAY REPEAT IN 2 HOURS IF HEADACHE PERSISTS OR RECURS  ? ? ?   ?Objective:  ? Physical Exam ?BP 140/70   Pulse (!) 59   Temp 97.9 ?F (36.6 ?C) (Oral)   Ht '5\' 2"'$  (1.575 m)   Wt 160 lb 6.4 oz (72.8 kg)   SpO2 98%   BMI 29.34 kg/m?  ?General: ?Well developed, NAD, BMI noted ?Neck: No  thyromegaly  ?HEENT:  ?Normocephalic . Face symmetric, atraumatic ?Lungs:  ?CTA B ?Normal respiratory effort, no intercostal retractions, no accessory muscle use. ?Heart: RRR,  no murmur.  ?Abdomen:  ?Not distended, soft, non-tender. No rebound or rigidity.   ?Lower extremities: no pretibial edema bilaterally  ?Skin: Exposed areas without rash. Not pale. Not jaundice ?Neurologic:  ?alert & oriented X3.  ?Speech normal, gait appropriate for age and unassisted ?Strength symmetric and appropriate for age.  ?Psych: ?Cognition and judgment appear intact.  ?Cooperative with normal attention span and concentration.  ?Behavior appropriate. ?No  anxious or depressed appearing. ? ?   ?Assessment   ? ?Assessment  ?Prediabetes: A1c 6.0 (03-2015) ?Dyslipidemia ?HTN (on BB) ?MSK: ?--DJD ?--Neck pain: Saw neurosurgery, MRI was done, DX DJD, nonsurgical candidate (2013 ) ?Osteopenia first  T score -1.2 (06/2016) rx ca and vit D  ?Migraines  (onn BB and imitrex) ?Ear canal eczema: topical steroids prn ? ?PLAN: ?Here for CPX ?Prediabetes: Checking labs ?Dyslipidemia: Started Cholesterol medication about a year ago, good results, recheck today. ?HTN: BP satisfactory, continue amlodipine and beta-blockers.  Recommend ambulatory BPs ?Migraines: Currently well controlled. ?Osteopenia: On vitamin D, recheck DEXA ?RTC 1 year ? ? ? ?This visit occurred during the SARS-CoV-2 public health emergency.  Safety protocols  were in place, including screening questions prior to the visit, additional usage of staff PPE, and extensive cleaning of exam room while observing appropriate contact time as indicated for disinfecting solutions.  ? ?

## 2021-07-10 NOTE — Patient Instructions (Addendum)
Check the  blood pressure regularly ?BP GOAL is between 110/65 and  135/85. ?If it is consistently higher or lower, let me know ? ?Continue taking vitamin D ? ?You are due for a colonoscopy October 2023.  Please reach out if you do not hear from the gastroenterology department in few months. ? ?Read the information about advance care planning ? ?A COVID vaccine booster is an option ? ?GO TO THE LAB : Get the blood work   ? ? ?[place], [place] ?Come back for a physical exam in 1 year ? ?STOP BY THE FIRST FLOOR: Schedule a dexa ?  ?

## 2021-07-11 ENCOUNTER — Encounter: Payer: Self-pay | Admitting: Internal Medicine

## 2021-07-11 NOTE — Assessment & Plan Note (Signed)
--  Td 2021 ?-PNM 23: 2015; Prevnar: 2016  ?-zostavax 03-2015; s/p shingrix x2 ?-  covid vax : booster is an option ?--Female care: @ [facility] ob-gyn Dr [place] [facility] ~ 05-2021,  MMG schedule for next month per pt  ?--ACQ:PEAKLTYVDPB 01-2015, + polyps, Dr Ardis Hughs, per GI letter next 01-2022 >>> pt aware  ?- diet, exercise: Discussed ?- labs : CMP, FLP, A1c, DEXA ?- ACP info provided  ? ?  ?

## 2021-07-11 NOTE — Assessment & Plan Note (Signed)
Here for CPX ?Prediabetes: Checking labs ?Dyslipidemia: Started Cholesterol medication about a year ago, good results, recheck today. ?HTN: BP satisfactory, continue amlodipine and beta-blockers.  Recommend ambulatory BPs ?Migraines: Currently well controlled. ?Osteopenia: On vitamin D, recheck DEXA ?RTC 1 year ?

## 2021-08-12 LAB — HM MAMMOGRAPHY

## 2021-08-21 ENCOUNTER — Encounter: Payer: Self-pay | Admitting: Internal Medicine

## 2021-09-07 IMAGING — MR MR SHOULDER*R* W/CM
4 of 6 series · 13 of 40 positions shown · IV contrast (agent unspecified)
Comparison: None.

CLINICAL DATA: Right shoulder pain

EXAM:
MR ARTHROGRAM OF THE right SHOULDER
TECHNIQUE: Multiplanar, multisequence MR imaging of the right shoulder was
performed following the administration of intra-articular contrast.
CONTRAST:  See Injection Documentation.

[Series 7: T1 fat-sat · axial · right · 3.0mm · 0.36mm/px · z∈[-23,+37]mm · 3 of 25 slices shown (1 of 2)]
[im 4/25]
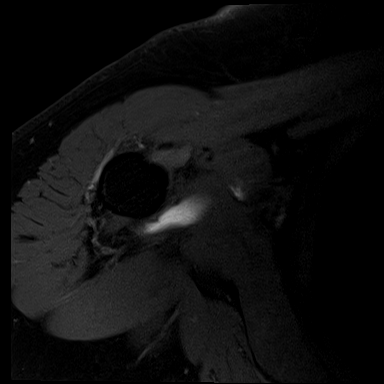
[im 14/25]
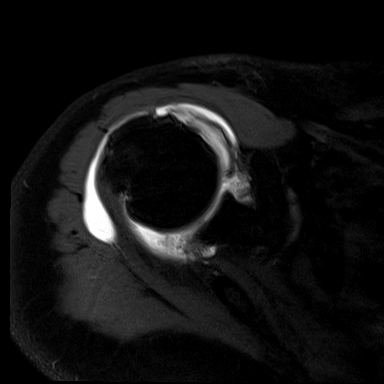
[im 21/25]
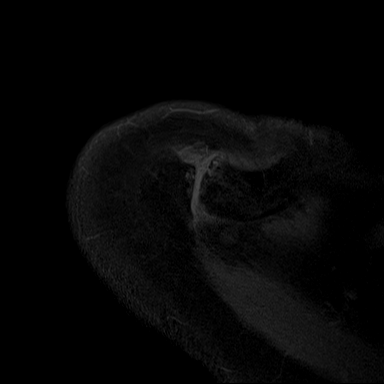

[Series 8: T2 fat-sat · oblique · right · 3.0mm · 0.22mm/px · 4 of 25 slices shown (1 of 2)]
[im 1/25]
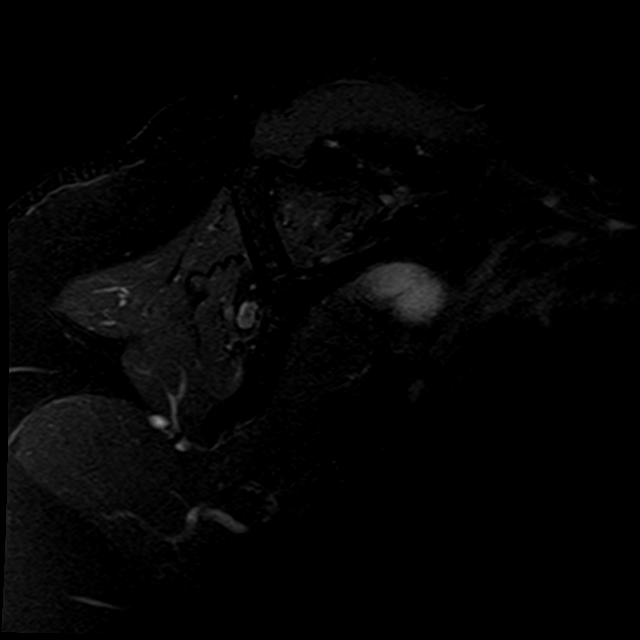
[im 4/25]
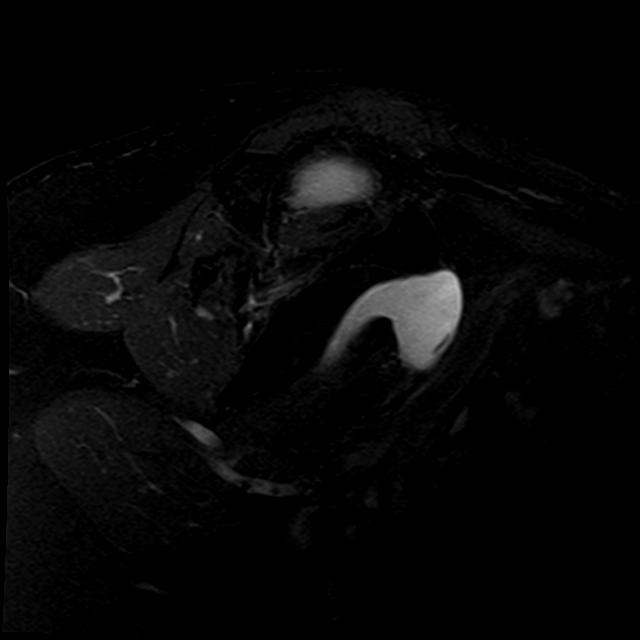
[im 14/25]
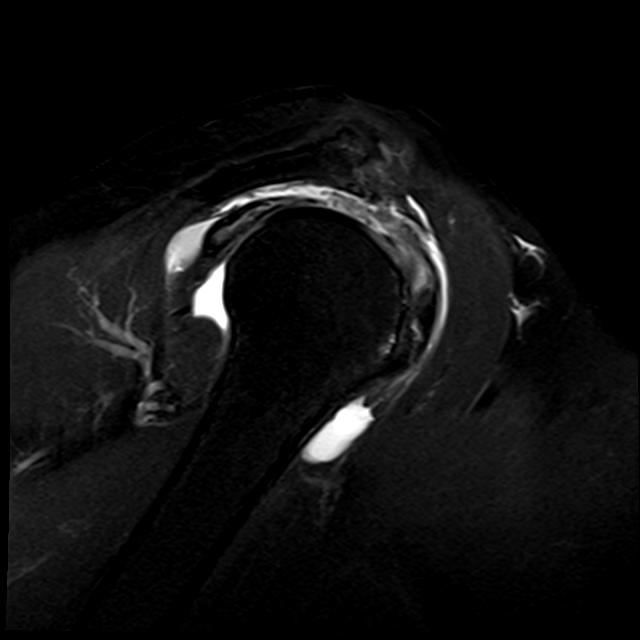
[im 21/25]
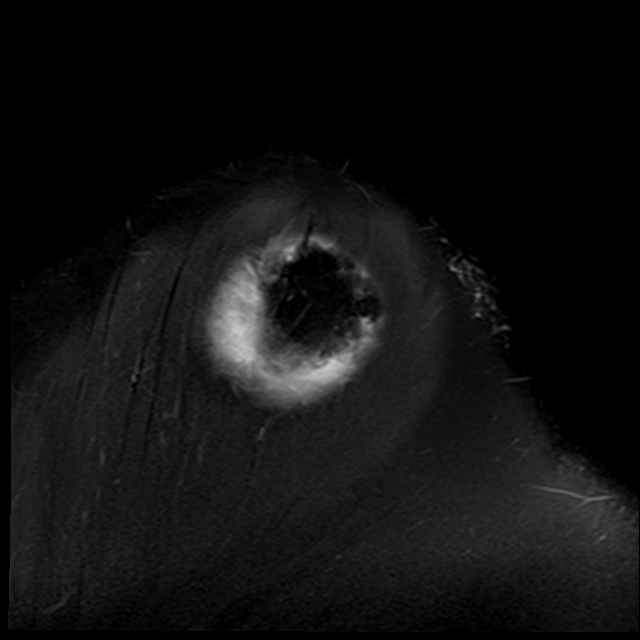

[Series 9: T1 fat-sat · oblique · right · 3.0mm · 0.18mm/px · 3 of 21 slices shown (2 of 2)]
[im 5/21]
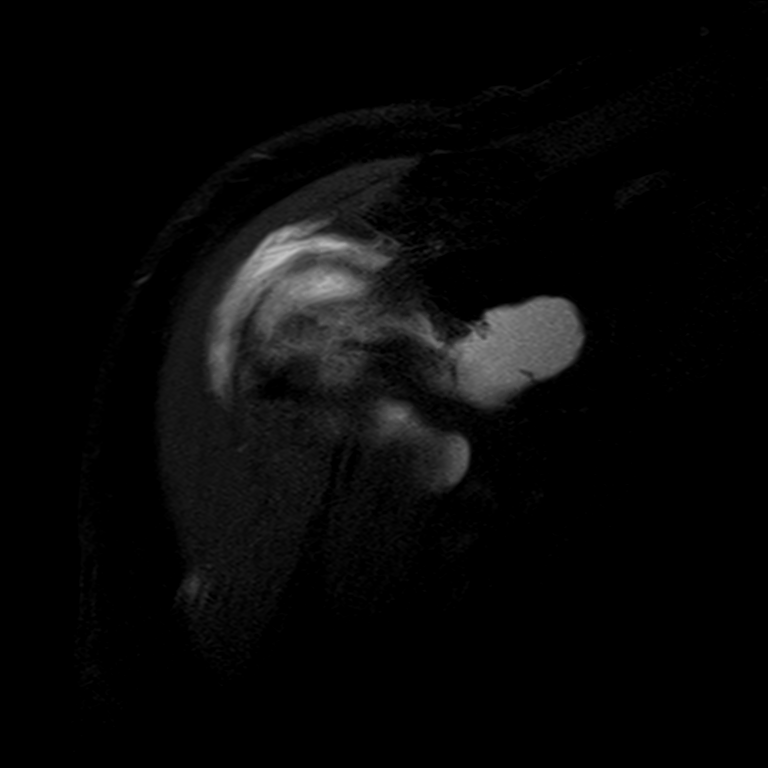
[im 13/21]
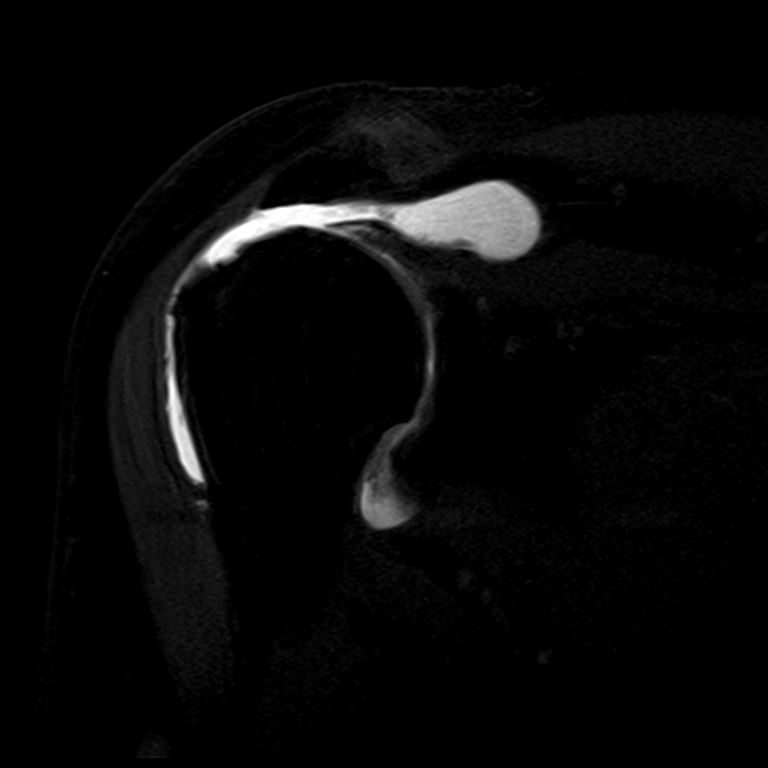
[im 21/21]
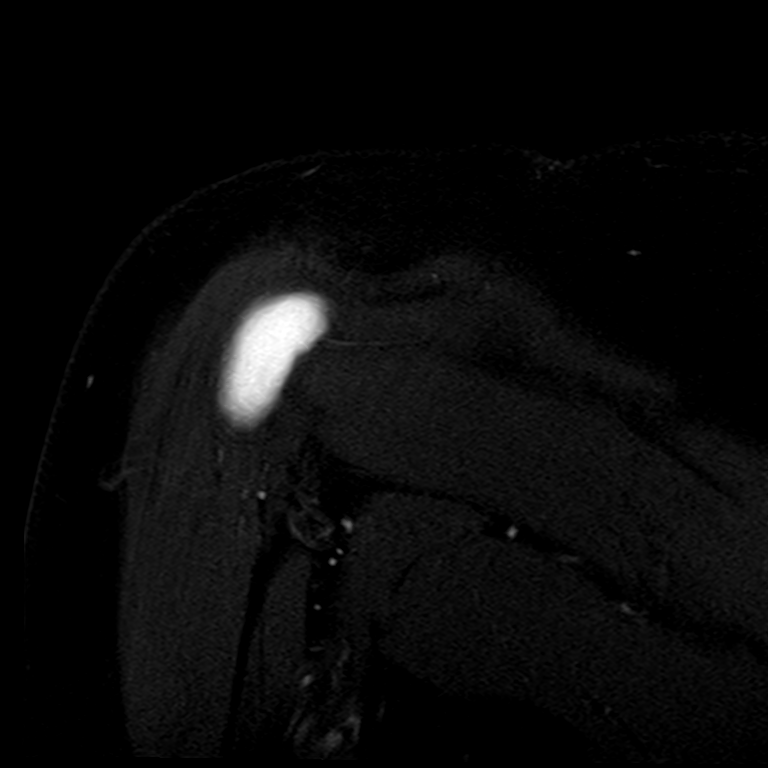

[Series 11: T2 fat-sat · oblique · right · 3.0mm · 0.22mm/px · 3 of 21 slices shown (2 of 2)]
[im 5/21]
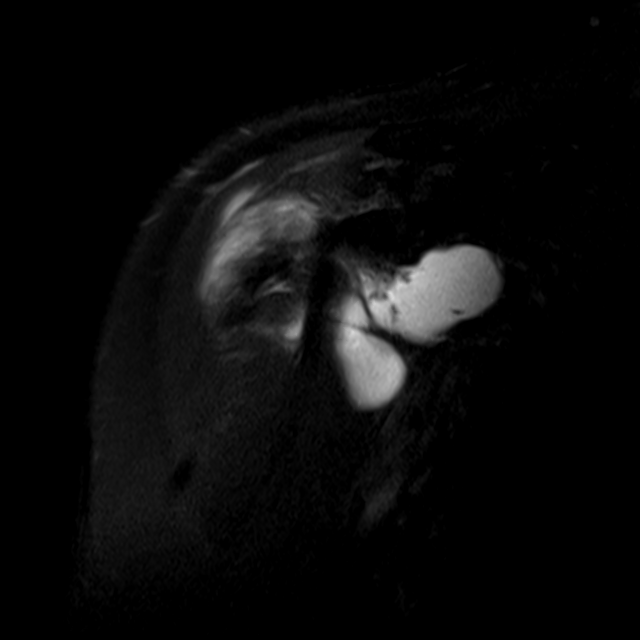
[im 13/21]
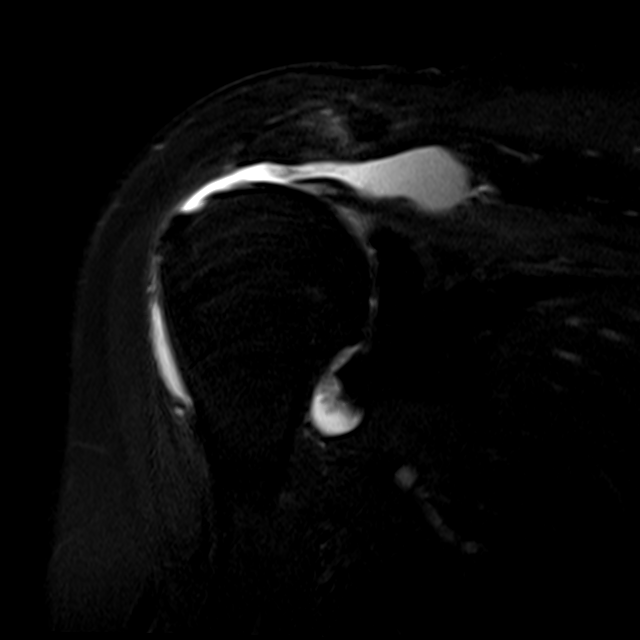
[im 21/21]
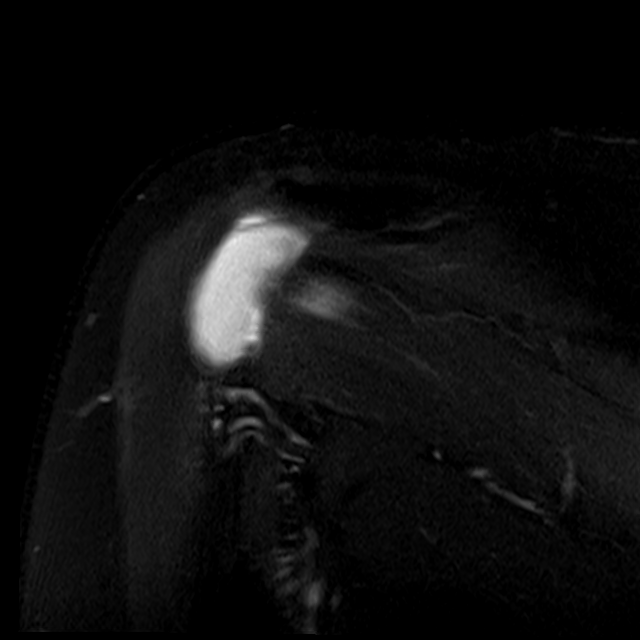

[13 of 40 positions shown; findings below may reference images not displayed]

FINDINGS: Rotator cuff: There is a full-thickness near complete tear of the
supraspinatus tendon with approximately 2.6 cm retraction of the
fibers. There is a slightly high-riding humeral head and fluid in
the subacromial-subdeltoid bursa. Bursal surface fraying is seen of
the infraspinatus tendon with increased intrasubstance signal seen
throughout. A focal full-thickness tear seen of the superior
subscapularis tendon measuring 5 mm in transverse dimension.
Increased globular signal thickening seen throughout the remainder
of the subscapularis tendon. The muscles of the rotator cuff are
normal without tear, edema, or atrophy.

Muscles: The muscles other than the rotator cuff are normal without
tear, edema, or atrophy.

Biceps Long Head: Increased intrasubstance signal seen within the
left hand the biceps tendon. There is slight lateral subluxation at
the biceps pulley. A partial interstitial tear seen within the
intra-articular portion of the long head biceps tendon.

Acromioclavicular Joint: Mild-to-moderate AC joint arthrosis seen
with joint space loss and capsular hypertrophy. Type II acromion.

Glenohumeral Joint: Mild-to-moderate glenohumeral articulation
chondral thinning is seen. Scattered debris seen within the inferior
joint space.

Labrum: There is a nondisplaced tear and attenuation of the
posterosuperior labrum. No paralabral cyst is seen.

Bones: No fracture, osteonecrosis, or pathologic marrow
infiltration.

Other: Fluid is seen within the subacromial-subdeltoid bursa.
IMPRESSION: 1. Near complete full-thickness tear of the supraspinatus tendon
with 2.6 cm of retraction of fibers. There is resultant high-riding
humeral head and fluid in the subacromial-subdeltoid bursa.
2. Focal full-thickness tear of the superior subscapularis tendon.
3. Moderate subscapularis and infraspinatus tendinosis
4. Mild-to-moderate glenohumeral joint chondral disease and a
nondisplaced tear with degeneration of the posterosuperior labrum.

## 2021-09-15 ENCOUNTER — Ambulatory Visit (HOSPITAL_BASED_OUTPATIENT_CLINIC_OR_DEPARTMENT_OTHER)
Admission: RE | Admit: 2021-09-15 | Discharge: 2021-09-15 | Disposition: A | Discharge status: Home / Self Care | Payer: Medicare PPO | Source: Ambulatory Visit | Attending: Internal Medicine | Admitting: Internal Medicine

## 2021-09-15 DIAGNOSIS — M85832 Other specified disorders of bone density and structure, left forearm: Secondary | ICD-10-CM | POA: Insufficient documentation

## 2021-09-15 DIAGNOSIS — Z13820 Encounter for screening for osteoporosis: Secondary | ICD-10-CM | POA: Insufficient documentation

## 2021-09-15 DIAGNOSIS — Z780 Asymptomatic menopausal state: Secondary | ICD-10-CM | POA: Insufficient documentation

## 2021-09-15 DIAGNOSIS — Z8262 Family history of osteoporosis: Secondary | ICD-10-CM | POA: Insufficient documentation

## 2021-09-15 DIAGNOSIS — M8580 Other specified disorders of bone density and structure, unspecified site: Secondary | ICD-10-CM | POA: Insufficient documentation

## 2021-09-25 MED ORDER — ALENDRONATE SODIUM 70 MG PO TABS: 70.0000 mg | ORAL_TABLET | ORAL | 3 refills | Status: DC

## 2021-09-25 NOTE — Addendum Note (Signed)
Addended byDamita Dunnings D on: 09/25/2021 01:45 PM   Modules accepted: Orders

## 2021-09-26 ENCOUNTER — Telehealth: Payer: Self-pay

## 2021-09-26 NOTE — Telephone Encounter (Signed)
Pt called back- informed of bone density results and recommendations- Pt agreed to start Fosamax- precautions given. Rx sent to [facility]

## 2021-10-08 ENCOUNTER — Other Ambulatory Visit: Payer: Self-pay | Admitting: Internal Medicine

## 2021-10-13 ENCOUNTER — Telehealth: Payer: Self-pay | Admitting: Internal Medicine

## 2021-10-13 NOTE — Progress Notes (Addendum)
Subjective:   KRISTEENA MEINEKE is a 76 y.o. female who presents for an Initial Medicare Annual Wellness Visit.  I connected with  Darcella Gasman on 10/15/21 by a audio enabled telemedicine application and verified that I am speaking with the correct person using two identifiers.  Patient Location: Home  Provider Location: Office/Clinic  I discussed the limitations of evaluation and management by telemedicine. The patient expressed understanding and agreed to proceed.   Review of Systems     Cardiac Risk Factors include: advanced age (>53mn, >>42women);hypertension;dyslipidemia     Objective:    There were no vitals filed for this visit. There is no height or weight on file to calculate BMI.     10/15/2021    9:06 AM 04/10/2021   11:17 AM 07/03/2020    8:52 AM 03/20/2020    8:42 AM 12/07/2019    8:08 AM 09/27/2019   10:13 AM 12/28/2014    3:10 PM  Advanced Directives  Does Patient Have a Medical Advance Directive? Yes Yes Yes Yes Yes Yes No  Type of [place] DNR (pink MOST or yellow form);Living will [place]Living will [place] will [place]Living will Living will Living will   Does patient want to make changes to medical advance directive? No - Patient declined No - Patient declined   No - Patient declined    Copy of HNelliein Chart? No - copy requested Yes - validated most recent copy scanned in chart (See row information) No - copy requested No - copy requested     Would patient like information on creating a medical advance directive?       No - patient declined information    Current Medications (verified) Outpatient Encounter Medications as of 10/15/2021  Medication Sig   alendronate (FOSAMAX) 70 MG tablet Take 1 tablet (70 mg total) by mouth every 7 (seven) days. Take with a full glass of water on an empty stomach.    amLODipine (NORVASC) 5 MG tablet TAKE 1 TABLET BY MOUTH EVERY DAY   atorvastatin (LIPITOR) 10 MG tablet TAKE 1 TABLET(10 MG) BY MOUTH AT BEDTIME   Cholecalciferol (VITAMIN D) 2000 units CAPS Take 1 capsule by mouth.   clobetasol (TEMOVATE) 0.05 % external solution Apply topically daily as needed.   Multiple Vitamin (MULTIVITAMIN WITH MINERALS) TABS Take 1 tablet by mouth daily.   propranolol ER (INDERAL LA) 60 MG 24 hr capsule TAKE 1 CAPSULE BY MOUTH EVERY DAY   SUMAtriptan (IMITREX) 50 MG tablet TAKE 1-2 TABLETS BY MOUTH AS NEEDED FOR MIGRAINE. MAY REPEAT IN 2 HOURS IF HEADACHE PERSISTS OR RECURS   No facility-administered encounter medications on file as of 10/15/2021.    Allergies (verified) Tylenol [acetaminophen]   History: Past Medical History:  Diagnosis Date   Allergic rhinitis    Common migraine    Hypertension    Past Surgical History:  Procedure Laterality Date   CARPAL TUNNEL RELEASE Left 01/05/2016   CARPAL TUNNEL RELEASE Right 04/2018   TYMPANOSTOMY TUBE PLACEMENT     Family History  Problem Relation Age of Onset   Colon cancer Neg Hx    Breast cancer Neg Hx    Diabetes Neg Hx    Heart attack Neg Hx    Social History   Socioeconomic History   Marital status: Married    Spouse name: Not on file   Number of children:  3   Years of education: Not on file   Highest education level: Not on file  Occupational History   Occupation: [name], 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    Employer: [employer]   Occupation: still works part time as off 07-2020  Tobacco Use   Smoking status: Never   Smokeless tobacco: Never  Substance and Sexual Activity   Alcohol use: No   Drug use: No   Sexual activity: Not on file  Other Topics Concern   Not on file  Social History Narrative   [place] from [place]   Divorced, remarried   Lives w/ husband   [name] her son lives in [place]     3 children ,1 son is paraplegic , has another that is a MD,other a PA     Social Determinants  of Radio broadcast assistant Strain: [place]  (10/15/2021)   Overall Financial Resource Strain (CARDIA)    Difficulty of Paying Living Expenses: Not hard at all  Food Insecurity: No Food Insecurity (10/15/2021)   Hunger Vital Sign    Worried About Running Out of Food in the Last Year: Never true    [place] in the Last Year: Never true  Transportation Needs: No Transportation Needs (10/15/2021)   PRAPARE - Hydrologist (Medical): No    Lack of Transportation (Non-Medical): No  Physical Activity: Sufficiently Active (10/15/2021)   Exercise Vital Sign    Days of Exercise per Week: 7 days    Minutes of Exercise per Session: 30 min  Stress: No Stress Concern Present (10/15/2021)   [place]    Feeling of Stress : Not at all  Social Connections: [place] (10/15/2021)   Social Connection and Isolation Panel [NHANES]    Frequency of Communication with Friends and Family: More than three times a week    Frequency of Social Gatherings with Friends and Family: More than three times a week    Attends Religious Services: More than 4 times per year    Active Member of [employer] or Organizations: Yes    Attends Music therapist: More than 4 times per year    Marital Status: Married    Tobacco Counseling Counseling given: Not Answered   Clinical Intake:  Pre-visit preparation completed: Yes  Pain : No/denies pain     BMI - recorded: 29.34 Nutritional Risks: None Diabetes: No  How often do you need to have someone help you when you read instructions, pamphlets, or other written materials from your doctor or pharmacy?: 1 - Never  Diabetic?yes  Interpreter Needed?: No  Information entered by :: Krystyn Picking   Activities of Daily Living    10/15/2021    9:08 AM 01/07/2021    8:46 AM  In your present state of health, do you have any difficulty performing the  following activities:  Hearing? 0 0  Vision? 0 0  Difficulty concentrating or making decisions? 0 0  Walking or climbing stairs? 0 0  Dressing or bathing? 0 0  Doing errands, shopping? 0 0  Preparing Food and eating ? N   Using the Toilet? N   In the past six months, have you accidently leaked urine? N   Do you have problems with loss of bowel control? N   Managing your Medications? N   Managing your Finances? N   Housekeeping or managing your Housekeeping? N     Patient Care  Team: Colon Branch, MD as PCP - General Milus Banister, MD as Attending Physician (Gastroenterology) Bobbye Charleston, MD as Consulting Physician (Obstetrics and Gynecology) Wylene Simmer, MD as Consulting Physician (Orthopedic Surgery)  Indicate any recent Medical Services you may have received from other than [facility] providers in the past year (date may be approximate).     Assessment:   This is a routine wellness examination for Analisa.  Hearing/Vision screen No results found.  Dietary issues and exercise activities discussed: Current Exercise Habits: Home exercise routine, Type of exercise: walking;stretching, Time (Minutes): 30, Frequency (Times/Week): 7, Weekly Exercise (Minutes/Week): 210, Intensity: Mild, Exercise limited by: None identified   Goals Addressed   None    Depression Screen    10/15/2021    9:06 AM 07/10/2021    8:16 AM 01/07/2021    8:37 AM 07/08/2020    8:31 AM 11/10/2019    9:25 AM 02/07/2019    8:48 AM 09/20/2017    2:22 PM  PHQ 2/9 Scores  PHQ - 2 Score 0 0 0 0 0 0 0    Fall Risk    10/15/2021    9:06 AM 07/10/2021    8:16 AM 01/07/2021    8:37 AM 07/08/2020    8:31 AM 11/10/2019    9:25 AM  Fall Risk   Falls in the past year? 0 0 0 0 0  Number falls in past yr: 0 0 0 0 0  Injury with Fall? 0 0 0 0 0  Risk for fall due to : No Fall Risks No Fall Risks     Follow up Falls evaluation completed Falls evaluation completed Falls evaluation completed  Falls evaluation completed     [place]:  Any stairs in or around the home? Yes  If so, are there any without handrails? No  Home free of loose throw rugs in walkways, pet beds, electrical cords, etc? Yes  Adequate lighting in your home to reduce risk of falls? Yes   ASSISTIVE DEVICES UTILIZED TO PREVENT FALLS:  Life alert? No  Use of a cane, walker or w/c? No  Grab bars in the bathroom? Yes  Shower chair or bench in shower? No  Elevated toilet seat or a handicapped toilet? No   TIMED UP AND GO:  Was the test performed? No .    Cognitive Function:        10/15/2021    9:11 AM  6CIT Screen  What Year? 0 points  What month? 0 points  What time? 0 points  Count back from 20 0 points  Months in reverse 0 points  Repeat phrase 0 points  Total Score 0 points    Immunizations Immunization History  Administered Date(s) Administered   Fluad Quad(high Dose 65+) 02/02/2020, 01/07/2021   Influenza, Seasonal, Injecte, Preservative Fre 02/19/2015   Influenza,inj,Quad PF,6+ Mos 02/07/2019   Influenza-Unspecified 02/20/2017   PFIZER(Purple Top)SARS-COV-2 Vaccination 05/12/2019, 06/02/2019, 01/24/2020   Pneumococcal Conjugate-13 03/18/2015   Pneumococcal Polysaccharide-23 02/19/2014   Td 08/20/2009   Tdap 11/10/2019   Zoster Recombinat (Shingrix) 10/13/2018, 12/27/2018   Zoster, Live 03/28/2015    TDAP status: Up to date  Flu Vaccine status: Up to date  Pneumococcal vaccine status: Up to date  Covid-19 vaccine status: Information provided on how to obtain vaccines.   Qualifies for Shingles Vaccine? Yes   Zostavax completed No   Shingrix Completed?: Yes  Screening Tests Health Maintenance  Topic Date Due   COVID-19 Vaccine (  4 - Pfizer series) 03/20/2020   INFLUENZA VACCINE  11/04/2021   COLONOSCOPY (Pts 45-72yr Insurance coverage will need to be confirmed)  01/10/2022   MAMMOGRAM  08/13/2022   TETANUS/TDAP  11/09/2029   Pneumonia Vaccine 75 Years old   Completed   DEXA SCAN  Completed   Hepatitis C Screening  Completed   Zoster Vaccines- Shingrix  Completed   HPV VACCINES  Aged Out    Health Maintenance  Health Maintenance Due  Topic Date Due   COVID-19 Vaccine (4 - Pfizer series) 03/20/2020    Colorectal cancer screening: Type of screening: Colonoscopy. Completed 01/11/15. Repeat every 7 years  Mammogram status: Completed 08/12/21. Repeat every year  Bone Density status: Completed 09/15/21. Results reflect: Bone density results: OSTEOPOROSIS. Repeat every 2 years.  Lung Cancer Screening: (Low Dose CT Chest recommended if Age 75-80years, 30 pack-year currently smoking OR have quit w/in 15years.) does not qualify.   Lung Cancer Screening Referral: n/a  Additional Screening:  Hepatitis C Screening: does qualify; Completed 03/18/15  Vision Screening: Recommended annual ophthalmology exams for early detection of glaucoma and other disorders of the eye. Is the patient up to date with their annual eye exam?  No  Who is the provider or what is the name of the office in which the patient attends annual eye exams? N/A If pt is not established with a provider, would they like to be referred to a provider to establish care? No .   Dental Screening: Recommended annual dental exams for proper oral hygiene  Community Resource Referral / Chronic Care Management: CRR required this visit?  No   CCM required this visit?  No      Plan:     I have personally reviewed and noted the following in the patient's chart:   Medical and social history Use of alcohol, tobacco or illicit drugs  Current medications and supplements including opioid prescriptions. Patient is not currently taking opioid prescriptions. Functional ability and status Nutritional status Physical activity Advanced directives List of other physicians Hospitalizations, surgeries, and ER visits in previous 12 months Vitals Screenings to include cognitive, depression,  and falls Referrals and appointments  In addition, I have reviewed and discussed with patient certain preventive protocols, quality metrics, and best practice recommendations. A written personalized care plan for preventive services as well as general preventive health recommendations were provided to patient.   Due to this being a telephonic visit, the after visit summary with patients personalized plan was offered to patient via mail or my-chart.  Patient would like to access on my-chart.   SDuard BradyChism, [place]  10/15/2021   Nurse Notes: none  I have reviewed and agree with Health Coaches documentation.  JKathlene November MD

## 2021-10-13 NOTE — Telephone Encounter (Signed)
LMOM asking for call back.  

## 2021-10-13 NOTE — Telephone Encounter (Signed)
Patient would like a call back to go over concern she has regarding her new alendronate (FOSAMAX) 70 MG tablet  medication. Please advise.

## 2021-10-14 NOTE — Telephone Encounter (Signed)
Pt has not returned call- closing telephone note.

## 2021-10-15 ENCOUNTER — Ambulatory Visit (INDEPENDENT_AMBULATORY_CARE_PROVIDER_SITE_OTHER): Payer: Medicare PPO

## 2021-10-15 DIAGNOSIS — Z0000 Encounter for general adult medical examination without abnormal findings: Secondary | ICD-10-CM

## 2021-10-15 NOTE — Patient Instructions (Signed)
Lori Goodwin , Thank you for taking time to come for your Medicare Wellness Visit. I appreciate your ongoing commitment to your health goals. Please review the following plan we discussed and let me know if I can assist you in the future.   Screening recommendations/referrals: Colonoscopy: 01/11/15 due 01/10/22 Mammogram: 08/12/21 due 08/13/22 Bone Density: 09/15/21 due 09/16/23 Recommended yearly ophthalmology/optometry visit for glaucoma screening and checkup Recommended yearly dental visit for hygiene and checkup  Vaccinations: Influenza vaccine: up to date Pneumococcal vaccine: up to date Tdap vaccine: up to date Shingles vaccine: up to date   Covid-19:Due-May obtain vaccine at your local pharmacy.   Advanced directives: yes, not on file  Conditions/risks identified: see problem list  Next appointment: Follow up in one year for your annual wellness visit 10/20/22   Preventive Care 65 Years and Older, Female Preventive care refers to lifestyle choices and visits with your health care provider that can promote health and wellness. What does preventive care include? A yearly physical exam. This is also called an annual well check. Dental exams once or twice a year. Routine eye exams. Ask your health care provider how often you should have your eyes checked. Personal lifestyle choices, including: Daily care of your teeth and gums. Regular physical activity. Eating a healthy diet. Avoiding tobacco and drug use. Limiting alcohol use. Practicing safe sex. Taking low-dose aspirin every day. Taking vitamin and mineral supplements as recommended by your health care provider. What happens during an annual well check? The services and screenings done by your health care provider during your annual well check will depend on your age, overall health, lifestyle risk factors, and family history of disease. Counseling  Your health care provider may ask you questions about your: Alcohol  use. Tobacco use. Drug use. Emotional well-being. Home and relationship well-being. Sexual activity. Eating habits. History of falls. Memory and ability to understand (cognition). Work and work Statistician. Reproductive health. Screening  You may have the following tests or measurements: Height, weight, and BMI. Blood pressure. Lipid and cholesterol levels. These may be checked every 5 years, or more frequently if you are over 35 years old. Skin check. Lung cancer screening. You may have this screening every year starting at age 61 if you have a 30-pack-year history of smoking and currently smoke or have quit within the past 15 years. Fecal occult blood test (FOBT) of the stool. You may have this test every year starting at age 77. Flexible sigmoidoscopy or colonoscopy. You may have a sigmoidoscopy every 5 years or a colonoscopy every 10 years starting at age 37. Hepatitis C blood test. Hepatitis B blood test. Sexually transmitted disease (STD) testing. Diabetes screening. This is done by checking your blood sugar (glucose) after you have not eaten for a while (fasting). You may have this done every 1-3 years. Bone density scan. This is done to screen for osteoporosis. You may have this done starting at age 33. Mammogram. This may be done every 1-2 years. Talk to your health care provider about how often you should have regular mammograms. Talk with your health care provider about your test results, treatment options, and if necessary, the need for more tests. Vaccines  Your health care provider may recommend certain vaccines, such as: Influenza vaccine. This is recommended every year. Tetanus, diphtheria, and acellular pertussis (Tdap, Td) vaccine. You may need a Td booster every 10 years. Zoster vaccine. You may need this after age 15. Pneumococcal 13-valent conjugate (PCV13) vaccine. One dose is recommended  after age 26. Pneumococcal polysaccharide (PPSV23) vaccine. One dose is  recommended after age 80. Talk to your health care provider about which screenings and vaccines you need and how often you need them. This information is not intended to replace advice given to you by your health care provider. Make sure you discuss any questions you have with your health care provider. Document Released: 04/19/2015 Document Revised: 12/11/2015 Document Reviewed: 01/22/2015 Elsevier Interactive Patient Education  2017 [employer] Prevention in the Home Falls can cause injuries. They can happen to people of all ages. There are many things you can do to make your home safe and to help prevent falls. What can I do on the outside of my home? Regularly fix the edges of walkways and driveways and fix any cracks. Remove anything that might make you trip as you walk through a door, such as a raised step or threshold. Trim any bushes or trees on the path to your home. Use bright outdoor lighting. Clear any walking paths of anything that might make someone trip, such as rocks or tools. Regularly check to see if handrails are loose or broken. Make sure that both sides of any steps have handrails. Any raised decks and porches should have guardrails on the edges. Have any leaves, snow, or ice cleared regularly. Use sand or salt on walking paths during winter. Clean up any spills in your garage right away. This includes oil or grease spills. What can I do in the bathroom? Use night lights. Install grab bars by the toilet and in the tub and shower. Do not use towel bars as grab bars. Use non-skid mats or decals in the tub or shower. If you need to sit down in the shower, use a plastic, non-slip stool. Keep the floor dry. Clean up any water that spills on the floor as soon as it happens. Remove soap buildup in the tub or shower regularly. Attach bath mats securely with double-sided non-slip rug tape. Do not have throw rugs and other things on the floor that can make you  trip. What can I do in the bedroom? Use night lights. Make sure that you have a light by your bed that is easy to reach. Do not use any sheets or blankets that are too big for your bed. They should not hang down onto the floor. Have a firm chair that has side arms. You can use this for support while you get dressed. Do not have throw rugs and other things on the floor that can make you trip. What can I do in the kitchen? Clean up any spills right away. Avoid walking on wet floors. Keep items that you use a lot in easy-to-reach places. If you need to reach something above you, use a strong step stool that has a grab bar. Keep electrical cords out of the way. Do not use floor polish or wax that makes floors slippery. If you must use wax, use non-skid floor wax. Do not have throw rugs and other things on the floor that can make you trip. What can I do with my stairs? Do not leave any items on the stairs. Make sure that there are handrails on both sides of the stairs and use them. Fix handrails that are broken or loose. Make sure that handrails are as long as the stairways. Check any carpeting to make sure that it is firmly attached to the stairs. Fix any carpet that is loose or worn. Avoid having throw  rugs at the top or bottom of the stairs. If you do have throw rugs, attach them to the floor with carpet tape. Make sure that you have a light switch at the top of the stairs and the bottom of the stairs. If you do not have them, ask someone to add them for you. What else can I do to help prevent falls? Wear shoes that: Do not have high heels. Have rubber bottoms. Are comfortable and fit you well. Are closed at the toe. Do not wear sandals. If you use a stepladder: Make sure that it is fully opened. Do not climb a closed stepladder. Make sure that both sides of the stepladder are locked into place. Ask someone to hold it for you, if possible. Clearly mark and make sure that you can  see: Any grab bars or handrails. First and last steps. Where the edge of each step is. Use tools that help you move around (mobility aids) if they are needed. These include: Canes. Walkers. Scooters. Crutches. Turn on the lights when you go into a dark area. Replace any light bulbs as soon as they burn out. Set up your furniture so you have a clear path. Avoid moving your furniture around. If any of your floors are uneven, fix them. If there are any pets around you, be aware of where they are. Review your medicines with your doctor. Some medicines can make you feel dizzy. This can increase your chance of falling. Ask your doctor what other things that you can do to help prevent falls. This information is not intended to replace advice given to you by your health care provider. Make sure you discuss any questions you have with your health care provider. Document Released: 01/17/2009 Document Revised: 08/29/2015 Document Reviewed: 04/27/2014 Elsevier Interactive Patient Education  2017 [employer].

## 2021-10-15 NOTE — Telephone Encounter (Signed)
Pt would like a call to discuss fosamax.

## 2021-10-16 NOTE — Telephone Encounter (Signed)
Spoke w/ Pt- she was recently recommended Fosamax- she did pick it up but read some of the side effects, she is mostly concerned about the potential of constipation, she has a mild prolapse and her GYN doctor informed her it is pertinent that she doesn't get constipated. She wanted to know if Dr. Larose Kells could recommend a more natural, plant based regimen.

## 2021-10-17 NOTE — Telephone Encounter (Signed)
A more "natural" treatment would be: - Daily walks - Vitamin D supplements - Calcium supplements but they may cause constipation.  I do recommend a trial with Fosamax, constipation would be a rare side effect.  If that is the case she could take Colace, a stool softener

## 2021-10-17 NOTE — Telephone Encounter (Signed)
LMOM informing Pt of recommendations. Instructed to call if questions/concerns.  

## 2021-12-03 ENCOUNTER — Other Ambulatory Visit: Payer: Self-pay | Admitting: Internal Medicine

## 2022-01-01 ENCOUNTER — Encounter: Payer: Self-pay | Admitting: Gastroenterology

## 2022-01-12 ENCOUNTER — Ambulatory Visit: Payer: Medicare PPO

## 2022-01-12 ENCOUNTER — Encounter: Payer: Self-pay | Admitting: Gastroenterology

## 2022-01-28 ENCOUNTER — Encounter: Payer: Self-pay | Admitting: Gastroenterology

## 2022-01-28 ENCOUNTER — Ambulatory Visit (AMBULATORY_SURGERY_CENTER): Payer: Self-pay | Admitting: *Deleted

## 2022-01-28 VITALS — Ht 62.0 in | Wt 153.6 lb

## 2022-01-28 DIAGNOSIS — Z86010 Personal history of colonic polyps: Secondary | ICD-10-CM

## 2022-01-28 MED ORDER — NA SULFATE-K SULFATE-MG SULF 17.5-3.13-1.6 GM/177ML PO SOLN: 1.0000 | Freq: Once | ORAL | 0 refills | Status: AC

## 2022-01-28 NOTE — Progress Notes (Signed)

## 2022-02-02 ENCOUNTER — Telehealth: Payer: Self-pay | Admitting: Gastroenterology

## 2022-02-02 NOTE — Telephone Encounter (Signed)
Returned patient call. Texted and mailed Good Rx coupon which stated price of $37 for Suprep.  Patient agreed.

## 2022-02-02 NOTE — Telephone Encounter (Signed)
Patient called states the pharmacy informed her the prep medication is not covered, they will cover Clenpiq

## 2022-02-06 ENCOUNTER — Encounter: Payer: Self-pay | Admitting: Gastroenterology

## 2022-02-06 ENCOUNTER — Ambulatory Visit (AMBULATORY_SURGERY_CENTER): Payer: Medicare PPO | Admitting: Gastroenterology

## 2022-02-06 VITALS — BP 129/57 | HR 47 | Temp 98.9°F | Resp 15 | Ht 62.0 in | Wt 153.0 lb

## 2022-02-06 DIAGNOSIS — Z09 Encounter for follow-up examination after completed treatment for conditions other than malignant neoplasm: Secondary | ICD-10-CM

## 2022-02-06 DIAGNOSIS — Z86010 Personal history of colonic polyps: Secondary | ICD-10-CM

## 2022-02-06 DIAGNOSIS — I10 Essential (primary) hypertension: Secondary | ICD-10-CM | POA: Diagnosis not present

## 2022-02-06 DIAGNOSIS — K635 Polyp of colon: Secondary | ICD-10-CM | POA: Diagnosis not present

## 2022-02-06 DIAGNOSIS — E785 Hyperlipidemia, unspecified: Secondary | ICD-10-CM | POA: Diagnosis not present

## 2022-02-06 DIAGNOSIS — D122 Benign neoplasm of ascending colon: Secondary | ICD-10-CM

## 2022-02-06 MED ORDER — SODIUM CHLORIDE 0.9 % IV SOLN: 500.0000 mL | Freq: Once | INTRAVENOUS | Status: DC

## 2022-02-06 NOTE — Patient Instructions (Signed)
Handout on polyps given to you today   YOU HAD AN ENDOSCOPIC PROCEDURE TODAY AT [place]:   Refer to the procedure report that was given to you for any specific questions about what was found during the examination.  If the procedure report does not answer your questions, please call your gastroenterologist to clarify.  If you requested that your care partner not be given the details of your procedure findings, then the procedure report has been included in a sealed envelope for you to review at your convenience later.  YOU SHOULD EXPECT: Some feelings of bloating in the abdomen. Passage of more gas than usual.  Walking can help get rid of the air that was put into your GI tract during the procedure and reduce the bloating. If you had a lower endoscopy (such as a colonoscopy or flexible sigmoidoscopy) you may notice spotting of blood in your stool or on the toilet paper. If you underwent a bowel prep for your procedure, you may not have a normal bowel movement for a few days.  Please Note:  You might notice some irritation and congestion in your nose or some drainage.  This is from the oxygen used during your procedure.  There is no need for concern and it should clear up in a day or so.  SYMPTOMS TO REPORT IMMEDIATELY:  Following lower endoscopy (colonoscopy or flexible sigmoidoscopy):  Excessive amounts of blood in the stool  Significant tenderness or worsening of abdominal pains  Swelling of the abdomen that is new, acute  Fever of 100F or higher  For urgent or emergent issues, a gastroenterologist can be reached at any hour by calling [phone]. Do not use MyChart messaging for urgent concerns.    DIET:  We do recommend a small meal at first, but then you may proceed to your regular diet.  Drink plenty of fluids but you should avoid alcoholic beverages for 24 hours.  ACTIVITY:  You should plan to take it easy for the rest of today and you should NOT DRIVE or use  heavy machinery until tomorrow (because of the sedation medicines used during the test).    FOLLOW UP: Our staff will call the number listed on your records the next business day following your procedure.  We will call around 7:15- 8:00 am to check on you and address any questions or concerns that you may have regarding the information given to you following your procedure. If we do not reach you, we will leave a message.     If any biopsies were taken you will be contacted by phone or by letter within the next 1-3 weeks.  Please call us at [phone] if you have not heard about the biopsies in 3 weeks.    SIGNATURES/CONFIDENTIALITY: You and/or your care partner have signed paperwork which will be entered into your electronic medical record.  These signatures attest to the fact that that the information above on your After Visit Summary has been reviewed and is understood.  Full responsibility of the confidentiality of this discharge information lies with you and/or your care-partner.

## 2022-02-06 NOTE — Progress Notes (Signed)
Pt's states no medical or surgical changes since previsit or office visit. 

## 2022-02-06 NOTE — Progress Notes (Signed)
A/ox3, pleased with MAC, report to RN 

## 2022-02-06 NOTE — Progress Notes (Signed)
History and Physical:  This patient presents for endoscopic testing for: Encounter Diagnosis  Name Primary?   Personal history of colonic polyps Yes    76 year old woman here for surveillance colonoscopy. Last colonoscopy October 2016 Lori Goodwin): Subcentimeter ascending colon tubular adenoma and ascending colon hyperplastic polyp. Patient denies chronic abdominal pain, rectal bleeding, constipation or diarrhea.  Patient is otherwise without complaints or active issues today.   Past Medical History: Past Medical History:  Diagnosis Date   Allergic rhinitis    Allergy    SEASONAL   Common migraine    Hyperlipidemia    Hypertension      Past Surgical History: Past Surgical History:  Procedure Laterality Date   CARPAL TUNNEL RELEASE Left 01/05/2016   CARPAL TUNNEL RELEASE Right 04/2018   COLONOSCOPY     POLYPECTOMY     TYMPANOSTOMY TUBE PLACEMENT      Allergies: Allergies  Allergen Reactions   Tylenol [Acetaminophen] Shortness Of Breath    Outpatient Meds: Current Outpatient Medications  Medication Sig Dispense Refill   alendronate (FOSAMAX) 70 MG tablet Take 1 tablet (70 mg total) by mouth every 7 (seven) days. Take with a full glass of water on an empty stomach. (Patient not taking: Reported on 01/28/2022) 12 tablet 3   amLODipine (NORVASC) 5 MG tablet TAKE 1 TABLET BY MOUTH EVERY DAY 90 tablet 1   atorvastatin (LIPITOR) 10 MG tablet Take 1 tablet (10 mg total) by mouth at bedtime. 90 tablet 1   Cholecalciferol (VITAMIN D) 2000 units CAPS Take 1 capsule by mouth.     clobetasol (TEMOVATE) 0.05 % external solution Apply topically daily as needed. (Patient not taking: Reported on 01/28/2022) 50 mL 1   loratadine (CLARITIN) 10 MG tablet 1 tablet Orally Once a day (Patient not taking: Reported on 01/28/2022)     Multiple Vitamin (MULTIVITAMIN WITH MINERALS) TABS Take 1 tablet by mouth daily.     propranolol ER (INDERAL LA) 60 MG 24 hr capsule TAKE 1 CAPSULE BY MOUTH EVERY  DAY 90 capsule 1   SUMAtriptan (IMITREX) 50 MG tablet TAKE 1-2 TABLETS BY MOUTH AS NEEDED FOR MIGRAINE. MAY REPEAT IN 2 HOURS IF HEADACHE PERSISTS OR RECURS (Patient not taking: Reported on 01/28/2022) 30 tablet 2   Current Facility-Administered Medications  Medication Dose Route Frequency Provider Last Rate Last Admin   0.9 %  sodium chloride infusion  500 mL Intravenous Once Doran Stabler, MD          ___________________________________________________________________ Objective   Exam:  BP (!) 177/82   Pulse (!) 55   Ht '5\' 2"'$  (1.575 m)   Wt 153 lb (69.4 kg)   SpO2 98%   BMI 27.98 kg/m   CV: regular , S1/S2 Resp: clear to auscultation bilaterally, normal RR and effort noted GI: soft, no tenderness, with active bowel sounds.   Assessment: Encounter Diagnosis  Name Primary?   Personal history of colonic polyps Yes     Plan: Colonoscopy  The benefits and risks of the planned procedure were described in detail with the patient or (when appropriate) their health care proxy.  Risks were outlined as including, but not limited to, bleeding, infection, perforation, adverse medication reaction leading to cardiac or pulmonary decompensation, pancreatitis (if ERCP).  The limitation of incomplete mucosal visualization was also discussed.  No guarantees or warranties were given.    The patient is appropriate for an endoscopic procedure in the ambulatory setting.   - Wilfrid Lund, MD

## 2022-02-06 NOTE — Progress Notes (Signed)
Called to room to assist during endoscopic procedure.  Patient ID and intended procedure confirmed with present staff. Received instructions for my participation in the procedure from the performing physician.  

## 2022-02-06 NOTE — Op Note (Signed)
[facility] Patient Name: Lori Goodwin Procedure Date: 02/06/2022 1:31 PM MRN: [id number] Endoscopist: Mallie Mussel L. Loletha Carrow , MD, [id number] Age: 76 Referring MD:  Date of Birth: 11-05-1945 Gender: Female Account #: [id number] Procedure:                Colonoscopy Indications:              Surveillance: Personal history of adenomatous                            polyps on last colonoscopy > 5 years ago                           01/2015 [name] - <20m Asc colon TA and                            diminutive Desc colon hyperplastic polyp Medicines:                Monitored Anesthesia Care Procedure:                Pre-Anesthesia Assessment:                           - Prior to the procedure, a History and Physical                            was performed, and patient medications and                            allergies were reviewed. The patient's tolerance of                            previous anesthesia was also reviewed. The risks                            and benefits of the procedure and the sedation                            options and risks were discussed with the patient.                            All questions were answered, and informed consent                            was obtained. Prior Anticoagulants: The patient has                            taken no anticoagulant or antiplatelet agents. ASA                            Grade Assessment: II - A patient with mild systemic                            disease. After reviewing the risks and benefits,  the patient was deemed in satisfactory condition to                            undergo the procedure.                           After obtaining informed consent, the colonoscope                            was passed under direct vision. Throughout the                            procedure, the patient's blood pressure, pulse, and                            oxygen saturations were monitored  continuously. The                            CF [id number] #[id number] was introduced through the anus                            and advanced to the the cecum, identified by                            appendiceal orifice and ileocecal valve. The                            colonoscopy was performed without difficulty. The                            patient tolerated the procedure well. The quality                            of the bowel preparation was excellent. The                            ileocecal valve, appendiceal orifice, and rectum                            were photographed. Scope In: 1:38:53 PM Scope Out: 1:50:11 PM Scope Withdrawal Time: 0 hours 8 minutes 16 seconds  Total Procedure Duration: 0 hours 11 minutes 18 seconds  Findings:                 The perianal and digital rectal examinations were                            normal.                           Repeat examination of right colon under NBI                            performed.  A 8 mm polyp was found in the ascending colon. The                            polyp was semi-sessile. The polyp was removed with                            a cold snare. Resection and retrieval were complete.                           The exam was otherwise without abnormality on                            direct and retroflexion views. Complications:            No immediate complications. Estimated Blood Loss:     Estimated blood loss was minimal. Impression:               - One 8 mm polyp in the ascending colon, removed                            with a cold snare. Resected and retrieved.                           - The examination was otherwise normal on direct                            and retroflexion views. Recommendation:           - Patient has a contact number available for                            emergencies. The signs and symptoms of potential                            delayed complications were discussed  with the                            patient. Return to normal activities tomorrow.                            Written discharge instructions were provided to the                            patient.                           - Resume previous diet.                           - Continue present medications.                           - Await pathology results.                           - No repeat surveillance colonoscopy recommended. Mallie Mussel  Laurian Brim, MD 02/06/2022 1:54:07 PM This report has been signed electronically.

## 2022-02-09 ENCOUNTER — Telehealth: Payer: Self-pay | Admitting: *Deleted

## 2022-02-09 NOTE — Telephone Encounter (Signed)
  Follow up Call-     02/06/2022    1:13 PM  Call back number  Post procedure Call Back phone  # [phone]  Permission to leave phone message Yes     Patient questions:  Do you have a fever, pain , or abdominal swelling? No. Pain Score  0 *  Have you tolerated food without any problems? Yes.    Have you been able to return to your normal activities? Yes.    Do you have any questions about your discharge instructions: Diet   No. Medications  No. Follow up visit  No.  Do you have questions or concerns about your Care? No.  Actions: * If pain score is 4 or above: No action needed, pain <4.

## 2022-02-12 ENCOUNTER — Encounter: Payer: Self-pay | Admitting: Gastroenterology

## 2022-03-20 ENCOUNTER — Ambulatory Visit (INDEPENDENT_AMBULATORY_CARE_PROVIDER_SITE_OTHER): Payer: Medicare PPO | Admitting: Family Medicine

## 2022-03-20 ENCOUNTER — Ambulatory Visit (INDEPENDENT_AMBULATORY_CARE_PROVIDER_SITE_OTHER): Payer: Medicare PPO

## 2022-03-20 ENCOUNTER — Ambulatory Visit: Payer: Self-pay

## 2022-03-20 VITALS — BP 120/88 | HR 45 | Ht 62.0 in | Wt 152.0 lb

## 2022-03-20 DIAGNOSIS — M25411 Effusion, right shoulder: Secondary | ICD-10-CM | POA: Insufficient documentation

## 2022-03-20 DIAGNOSIS — M25511 Pain in right shoulder: Secondary | ICD-10-CM

## 2022-03-20 NOTE — Patient Instructions (Signed)
Injected shoulder Xray today See me in 5 weeks

## 2022-03-20 NOTE — Progress Notes (Signed)
[facility] Phone: [phone] Subjective:   Lori Goodwin, am serving as a scribe for Dr. Hulan Saas.  I'm seeing this patient by the request  of:  Colon Branch, MD  CC: Right shoulder pain  NIO:[id number]  05/01/2020 Patient still has signs and symptoms that is consistent with a rotator cuff arthropathy.  Patient does have degenerative changes noted and patient does have some tearing noted on ultrasound today of the rotator cuff.  There is a possibility though that I feel the patient should have advanced imaging to see if any surgical intervention could be beneficial.  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  Depending on imaging we will discuss further treatment options.      Update 03/20/2022 Lori Goodwin is a 76 y.o. female coming in with complaint of R shoulder pain. Last seen January 2022. Patient states that she fell 2 days ago. [place] injury. Pain over tricep that is dull and achy throughout her entire arm. Slowly improving. Using Advil and trying to not use arm. Patient has been doing HEP to manage shoulder pain.       Past Medical History:  Diagnosis Date   Allergic rhinitis    Allergy    SEASONAL   Common migraine    Hyperlipidemia    Hypertension    Past Surgical History:  Procedure Laterality Date   CARPAL TUNNEL RELEASE Left 01/05/2016   CARPAL TUNNEL RELEASE Right 04/2018   COLONOSCOPY     POLYPECTOMY     TYMPANOSTOMY TUBE PLACEMENT     Social History   Socioeconomic History   Marital status: Married    Spouse name: Not on file   Number of children: 3   Years of education: Not on file   Highest education level: Not on file  Occupational History   Occupation: [name], 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    Employer: [employer]   Occupation: still  works part time as off 07-2020  Tobacco Use   Smoking status: Never    Passive exposure: Never   Smokeless tobacco: Never  Vaping Use   Vaping Use: Never used  Substance and Sexual Activity   Alcohol use: Goodwin   Drug use: Goodwin   Sexual activity: Not on file  Other Topics Concern   Not on file  Social History Narrative   [place] from [place]   Divorced, remarried   Lives w/ husband   [name] her son lives in [place]     3 children ,1 son is paraplegic , has another that is a MD,other a PA     Social Determinants of Radio broadcast assistant Strain: [place]  (10/15/2021)   Overall Financial Resource Strain (CARDIA)    Difficulty of Paying Living Expenses: Not hard at all  Food Insecurity: Goodwin Food Insecurity (10/15/2021)   Hunger Vital Sign    Worried About Running Out of Food in the Last Year: Never true    [place] in the Last Year: Never true  Transportation Needs: Goodwin Transportation Needs (10/15/2021)   PRAPARE - Hydrologist (Medical): Goodwin    Lack of Transportation (Non-Medical): Goodwin  Physical Activity: Sufficiently Active (10/15/2021)   Exercise Vital Sign    Days of Exercise per Week:  7 days    Minutes of Exercise per Session: 30 min  Stress: Goodwin Stress Concern Present (10/15/2021)   [place]    Feeling of Stress : Not at all  Social Connections: [place] (10/15/2021)   Social Connection and Isolation Panel [NHANES]    Frequency of Communication with Friends and Family: More than three times a week    Frequency of Social Gatherings with Friends and Family: More than three times a week    Attends Religious Services: More than 4 times per year    Active Member of [employer] or Organizations: Yes    Attends Music therapist: More than 4 times per year    Marital Status: Married   Allergies  Allergen Reactions   Tylenol [Acetaminophen] Shortness Of Breath    Family History  Problem Relation Age of Onset   Colon cancer Neg Hx    Breast cancer Neg Hx    Diabetes Neg Hx    Heart attack Neg Hx    Colon polyps Neg Hx    Crohn's disease Neg Hx    Esophageal cancer Neg Hx    Rectal cancer Neg Hx    Stomach cancer Neg Hx    Ulcerative colitis Neg Hx     Current Outpatient Medications (Endocrine & Metabolic):    alendronate (FOSAMAX) 70 MG tablet, Take 1 tablet (70 mg total) by mouth every 7 (seven) days. Take with a full glass of water on an empty stomach.  Current Outpatient Medications (Cardiovascular):    amLODipine (NORVASC) 5 MG tablet, TAKE 1 TABLET BY MOUTH EVERY DAY   atorvastatin (LIPITOR) 10 MG tablet, Take 1 tablet (10 mg total) by mouth at bedtime.   propranolol ER (INDERAL LA) 60 MG 24 hr capsule, TAKE 1 CAPSULE BY MOUTH EVERY DAY  Current Outpatient Medications (Respiratory):    loratadine (CLARITIN) 10 MG tablet,   Current Outpatient Medications (Analgesics):    SUMAtriptan (IMITREX) 50 MG tablet, TAKE 1-2 TABLETS BY MOUTH AS NEEDED FOR MIGRAINE. MAY REPEAT IN 2 HOURS IF HEADACHE PERSISTS OR RECURS   Current Outpatient Medications (Other):    Cholecalciferol (VITAMIN D) 2000 units CAPS, Take 1 capsule by mouth.   clobetasol (TEMOVATE) 0.05 % external solution, Apply topically daily as needed.   Multiple Vitamin (MULTIVITAMIN WITH MINERALS) TABS, Take 1 tablet by mouth daily.   Reviewed prior external information including notes and imaging from  primary care provider As well as notes that were available from care everywhere and other healthcare systems.  Past medical history, social, surgical and family history all reviewed in electronic medical record.  Goodwin pertanent information unless stated regarding to the chief complaint.   Review of Systems:  Goodwin headache, visual changes, nausea, vomiting, diarrhea, constipation, dizziness, abdominal pain, skin rash, fevers, chills, night sweats, weight loss, swollen lymph nodes,  body aches, joint swelling, chest pain, shortness of breath, mood changes. POSITIVE muscle aches  Objective  Blood pressure 120/88, pulse (!) 45, height '5\' 2"'$  (1.575 m), weight 152 lb (68.9 kg), SpO2 97 %.   General: Goodwin apparent distress alert and oriented x3 mood and affect normal, dressed appropriately.  HEENT: Pupils equal, extraocular movements intact  Respiratory: Patient's speak in full sentences and does not appear short of breath  Cardiovascular: Goodwin lower extremity edema, non tender, Goodwin erythema  Patient does have an inferior and inferior shoulder noted.   Manipulation did have an audible click and patient had improvement in  range of motion.  He is still tender to palpation over the acromioclavicular joint. Still has some weakness of the rotator cuff but seems to be about patient's baseline.  Limited muscular skeletal ultrasound was performed and interpreted by Hulan Saas, M  Limited ultrasound of patient's shoulder shows some mild hypoechoic changes of the bicep tendon, mild chronic tearing of the supraspinatus still noted.  Patient does still have what appears to be an acute hypoechoic changes of the acromioclavicular joint.  Goodwin cortical irregularity that would be consistent with acute fracture but does have arthritic changes. Impression shows effusion of the acromioclavicular joint  Procedure: Real-time Ultrasound Guided Injection of right acromioclavicular joint Device: GE Logiq Q7 Ultrasound guided injection is preferred based studies that show increased duration, increased effect, greater accuracy, decreased procedural pain, increased response rate, and decreased cost with ultrasound guided versus blind injection.  Verbal informed consent obtained.  Time-out conducted.  Noted Goodwin overlying erythema, induration, or other signs of local infection.  Skin prepped in a sterile fashion.  Local anesthesia: Topical Ethyl chloride.  With sterile technique and under real time  ultrasound guidance: With a 25-gauge half inch needle injected with 0.5 cc of 0.5% Marcaine and 0.5 cc of Kenalog 40 mg/mL. Completed without difficulty  Pain immediately resolved suggesting accurate placement of the medication.  Advised to call if fevers/chills, erythema, induration, drainage, or persistent bleeding.  Impression: Technically successful ultrasound guided injection.    Impression and Recommendations:     The above documentation has been reviewed and is accurate and complete [name], DO

## 2022-03-20 NOTE — Assessment & Plan Note (Signed)
Patient given injection and tolerated the procedure well, discussed icing regimen and home exercises.  Discussed which activities to do and which ones to avoid.  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  Patient will continue to be active when possible but can start with more range of motion and pendulum follow-up with me again in 4 weeks

## 2022-03-23 ENCOUNTER — Telehealth: Payer: Self-pay | Admitting: Family Medicine

## 2022-03-23 NOTE — Telephone Encounter (Signed)
Left message for patient to call back for results.  

## 2022-03-23 NOTE — Telephone Encounter (Signed)
Patient called asking if someone could call her to go over her xray results in more detail. She did she the results and note from Dr Tamala Julian in [place].

## 2022-03-24 NOTE — Telephone Encounter (Signed)
Spoke with patient per xray results.  

## 2022-04-02 ENCOUNTER — Encounter: Payer: Self-pay | Admitting: Family Medicine

## 2022-04-06 ENCOUNTER — Other Ambulatory Visit: Payer: Self-pay | Admitting: Internal Medicine

## 2022-04-30 NOTE — Progress Notes (Signed)
[facility] Phone: [phone] Subjective:   [name], am serving as a scribe for Dr. Hulan Saas.  I'm seeing this patient by the request  of:  Colon Branch, MD  CC: Right shoulder pain follow-up  [id number]  03/20/2022 Patient given injection and tolerated the procedure well, discussed icing regimen and home exercises.  Discussed which activities to do and which ones to avoid.  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  Patient will continue to be active when possible but can start with more range of motion and pendulum follow-up with me again in 4 weeks      Update 05/01/2022 Lori Goodwin is a 77 y.o. female coming in with complaint of R shoulder pain. Patient states that her pain has improved in shoulder joint. Now feels pain into the bicep and has a hard time hooking bra behind her back. No new injury.   Xray R shoulder 03/20/2022 IMPRESSION: 1. Moderate distal lateral subacromial spurring and downsloping, mildly worsened from 03/06/2020. 2. Moderate glenohumeral osteoarthritis, similar to prior.    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     Employer: [employer]   Occupation: still works part time as off 07-2020  Tobacco Use   Smoking status: Never    Passive exposure: Never   Smokeless tobacco: Never  Vaping Use   Vaping Use: Never used  Substance and Sexual Activity   Alcohol use: No   Drug use: No   Sexual activity: Not on file  Other Topics Concern   Not on file  Social History Narrative   Original from [place]   Divorced, remarried   Lives w/ husband   Lori Goodwin her son lives in [place]     3 children ,1 son is paraplegic , has another that is a MD,other a PA     Social Determinants of Radio broadcast assistant Strain: [place]  (10/15/2021)   Overall Financial Resource Strain (CARDIA)    Difficulty of Paying Living Expenses: Not hard at all  Food Insecurity: No Food Insecurity (10/15/2021)   Hunger Vital Sign    Worried About Running Out of Food in the Last Year: Never true    [place] in the Last Year: Never true  Transportation Needs: No Transportation Needs (10/15/2021)   PRAPARE - Hydrologist (Medical): No    Lack of Transportation (Non-Medical): No  Physical Activity: Sufficiently Active (10/15/2021)   Exercise Vital Sign    Days of Exercise per Week: 7  days    Minutes of Exercise per Session: 30 min  Stress: No Stress Concern Present (10/15/2021)   [place]    Feeling of Stress : Not at all  Social Connections: [place] (10/15/2021)   Social Connection and Isolation Panel [NHANES]    Frequency of Communication with Friends and Family: More than three times a week    Frequency of Social Gatherings with Friends and Family: More than three times a week    Attends Religious Services: More than 4 times per year    Active Member of [employer] or Organizations: Yes    Attends Music therapist: More than 4 times per year    Marital Status: Married   Allergies  Allergen  Reactions   Tylenol [Acetaminophen] Shortness Of Breath   Family History  Problem Relation Age of Onset   Colon cancer Neg Hx    Breast cancer Neg Hx    Diabetes Neg Hx    Heart attack Neg Hx    Colon polyps Neg Hx    Crohn's disease Neg Hx    Esophageal cancer Neg Hx    Rectal cancer Neg Hx    Stomach cancer Neg Hx    Ulcerative colitis Neg Hx     Current Outpatient Medications (Endocrine & Metabolic):    alendronate (FOSAMAX) 70 MG tablet, Take 1 tablet (70 mg total) by mouth every 7 (seven) days. Take with a full glass of water on an empty stomach.  Current Outpatient Medications (Cardiovascular):    amLODipine (NORVASC) 5 MG tablet, Take 1 tablet (5 mg total) by mouth daily.   atorvastatin (LIPITOR) 10 MG tablet, Take 1 tablet (10 mg total) by mouth at bedtime.   propranolol ER (INDERAL LA) 60 MG 24 hr capsule, Take 1 capsule (60 mg total) by mouth daily.  Current Outpatient Medications (Respiratory):    loratadine (CLARITIN) 10 MG tablet,   Current Outpatient Medications (Analgesics):    SUMAtriptan (IMITREX) 50 MG tablet, TAKE 1-2 TABLETS BY MOUTH AS NEEDED FOR MIGRAINE. MAY REPEAT IN 2 HOURS IF HEADACHE PERSISTS OR RECURS   Current Outpatient Medications (Other):    Cholecalciferol (VITAMIN D) 2000 units CAPS, Take 1 capsule by mouth.   clobetasol (TEMOVATE) 0.05 % external solution, Apply topically daily as needed.   Multiple Vitamin (MULTIVITAMIN WITH MINERALS) TABS, Take 1 tablet by mouth daily.   Reviewed prior external information including notes and imaging from  primary care provider As well as notes that were available from care everywhere and other healthcare systems.  Past medical history, social, surgical and family history all reviewed in electronic medical record.  No pertanent information unless stated regarding to the chief complaint.   Review of Systems:  No headache, visual changes, nausea, vomiting, diarrhea, constipation, dizziness, abdominal  pain, skin rash, fevers, chills, night sweats, weight loss, swollen lymph nodes, body aches, joint swelling, chest pain, shortness of breath, mood changes. POSITIVE muscle aches  Objective  Blood pressure 102/64, pulse 68, height '5\' 2"'$  (1.575 m), weight 154 lb (69.9 kg), SpO2 98 %.   General: No apparent distress alert and oriented x3 mood and affect normal, dressed appropriately.  HEENT: Pupils equal, extraocular movements intact  Respiratory: Patient's speak in full sentences and does not appear short of breath  Cardiovascular: No lower extremity edema, non tender, no erythema  Right shoulder exam does have some crepitus noted.  Some limited range of motion.  Weakness of the rotator cuff  with 3 out of 5 strength.  Positive Speed and Jurgenson sign   Limited muscular skeletal ultrasound was performed and interpreted by Hulan Saas, M Limited ultrasound of patient's shoulder shows that patient does have arthritic changes still noted, patient does have moderate to severe narrowing of the acromioclavicular joint and the glenohumeral joint.  Significant hypoechoic changes noted in the bicep tendon sheath. 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     Impression and Recommendations:    The above documentation has been reviewed and is accurate and complete [name], DO

## 2022-05-01 ENCOUNTER — Ambulatory Visit (INDEPENDENT_AMBULATORY_CARE_PROVIDER_SITE_OTHER): Payer: Medicare PPO | Admitting: Family Medicine

## 2022-05-01 ENCOUNTER — Ambulatory Visit: Payer: Self-pay

## 2022-05-01 VITALS — BP 102/64 | HR 68 | Ht 62.0 in | Wt 154.0 lb

## 2022-05-01 DIAGNOSIS — M12811 Other specific arthropathies, not elsewhere classified, right shoulder: Secondary | ICD-10-CM | POA: Diagnosis not present

## 2022-05-01 DIAGNOSIS — M25511 Pain in right shoulder: Secondary | ICD-10-CM

## 2022-05-01 NOTE — Assessment & Plan Note (Signed)
Discussed with patient at great length about the rotator cuff arthropathy.  Does have decrease in the acromioclavicular joint swelling but still has the significant in the glenohumeral joint.  We discussed potentially doing injections but would like to wait 10 weeks in between.  Patient is in agreement with this.  Discussed different treatment options including again advanced imaging.  Patient would like to avoid any surgical intervention.  Total time discussing with patient and reviewing patient's previous imaging and other notes 32 minutes

## 2022-05-01 NOTE — Patient Instructions (Signed)
Arm compression sleeve Arnica lotion 2x a day or Voltaren Continue to ice See you again in 5-6 weeks

## 2022-05-12 DIAGNOSIS — N814 Uterovaginal prolapse, unspecified: Secondary | ICD-10-CM | POA: Diagnosis not present

## 2022-05-31 ENCOUNTER — Other Ambulatory Visit: Payer: Self-pay | Admitting: Internal Medicine

## 2022-06-12 ENCOUNTER — Ambulatory Visit: Payer: Medicare PPO | Admitting: Family Medicine

## 2022-06-25 ENCOUNTER — Telehealth: Payer: Self-pay | Admitting: Internal Medicine

## 2022-06-25 MED ORDER — CLOBETASOL PROPIONATE 0.05 % EX SOLN: Freq: Every day | CUTANEOUS | 1 refills | Status: DC | PRN

## 2022-06-25 NOTE — Telephone Encounter (Signed)
Rx sent 

## 2022-06-25 NOTE — Telephone Encounter (Signed)
Prescription Request  06/25/2022  Is this a "Controlled Substance" medicine? No  LOV: Visit date not found  What is the name of the medication or equipment?  clobetasol (TEMOVATE) 0.05 % external solution   Have you contacted your pharmacy to request a refill? No   Which pharmacy would you like this sent to?  [id number] [address], [address] AT [facility] [address] [place] [address] Phone: [phone] Fax: [phone]    Patient notified that their request is being sent to the clinical staff for review and that they should receive a response within 2 business days.   Please advise at [place]

## 2022-06-26 ENCOUNTER — Telehealth: Payer: Self-pay | Admitting: Family Medicine

## 2022-06-26 NOTE — Telephone Encounter (Signed)
Patient called stating that she was given a knee brace a while back and it is starting to show some age.  She asked if she would be able to get a new one.

## 2022-06-29 NOTE — Telephone Encounter (Signed)
Wrote patient about obtaining brace.

## 2022-07-13 ENCOUNTER — Encounter: Payer: Medicare PPO | Admitting: Internal Medicine

## 2022-08-28 ENCOUNTER — Encounter: Payer: Self-pay | Admitting: Internal Medicine

## 2022-08-28 ENCOUNTER — Encounter: Payer: Medicare PPO | Admitting: Internal Medicine

## 2022-08-28 ENCOUNTER — Other Ambulatory Visit: Payer: Self-pay

## 2022-08-28 ENCOUNTER — Ambulatory Visit (INDEPENDENT_AMBULATORY_CARE_PROVIDER_SITE_OTHER): Payer: Medicare PPO | Admitting: Internal Medicine

## 2022-08-28 VITALS — BP 126/68 | HR 42 | Temp 97.7°F | Resp 16 | Ht 62.0 in | Wt 152.1 lb

## 2022-08-28 DIAGNOSIS — I10 Essential (primary) hypertension: Secondary | ICD-10-CM | POA: Diagnosis not present

## 2022-08-28 DIAGNOSIS — Z0000 Encounter for general adult medical examination without abnormal findings: Secondary | ICD-10-CM | POA: Diagnosis not present

## 2022-08-28 DIAGNOSIS — E785 Hyperlipidemia, unspecified: Secondary | ICD-10-CM | POA: Diagnosis not present

## 2022-08-28 DIAGNOSIS — R739 Hyperglycemia, unspecified: Secondary | ICD-10-CM | POA: Diagnosis not present

## 2022-08-28 DIAGNOSIS — Z1231 Encounter for screening mammogram for malignant neoplasm of breast: Secondary | ICD-10-CM

## 2022-08-28 LAB — COMPREHENSIVE METABOLIC PANEL
ALT: 20 U/L (ref 0–35)
AST: 22 U/L (ref 0–37)
Albumin: 4.2 g/dL (ref 3.5–5.2)
Alkaline Phosphatase: 57 U/L (ref 39–117)
BUN: 14 mg/dL (ref 6–23)
CO2: 29 mEq/L (ref 19–32)
Calcium: 9.5 mg/dL (ref 8.4–10.5)
Chloride: 106 mEq/L (ref 96–112)
Creatinine, Ser: 0.69 mg/dL (ref 0.40–1.20)
GFR: 84.52 mL/min (ref >60.00)
Glucose, Bld: 91 mg/dL (ref 70–99)
Potassium: 4.5 mEq/L (ref 3.5–5.1)
Sodium: 142 mEq/L (ref 135–145)
Total Bilirubin: 0.7 mg/dL (ref 0.2–1.2)
Total Protein: 6.6 g/dL (ref 6.0–8.3)

## 2022-08-28 LAB — CBC WITH DIFFERENTIAL/PLATELET
Basophils Absolute: 0 10*3/uL (ref 0.0–0.1)
Basophils Relative: 0.9 % (ref 0.0–3.0)
Eosinophils Absolute: 0.2 10*3/uL (ref 0.0–0.7)
Eosinophils Relative: 4.9 % (ref 0.0–5.0)
HCT: 38.4 % (ref 36.0–46.0)
Hemoglobin: 13 g/dL (ref 12.0–15.0)
Lymphocytes Relative: 37.3 % (ref 12.0–46.0)
Lymphs Abs: 1.9 10*3/uL (ref 0.7–4.0)
MCHC: 33.9 g/dL (ref 30.0–36.0)
MCV: 92.5 fl (ref 78.0–100.0)
Monocytes Absolute: 0.3 10*3/uL (ref 0.1–1.0)
Monocytes Relative: 6 % (ref 3.0–12.0)
Neutro Abs: 2.6 10*3/uL (ref 1.4–7.7)
Neutrophils Relative %: 50.9 % (ref 43.0–77.0)
Platelets: 202 10*3/uL (ref 150.0–400.0)
RBC: 4.16 Mil/uL (ref 3.87–5.11)
RDW: 12.4 % (ref 11.5–15.5)
WBC: 5.1 10*3/uL (ref 4.0–10.5)

## 2022-08-28 LAB — LIPID PANEL
Cholesterol: 117 mg/dL (ref 0–200)
HDL: 55.8 mg/dL (ref >39.00)
LDL Cholesterol: 50 mg/dL (ref 0–99)
NonHDL: 61.68
Total CHOL/HDL Ratio: 2
Triglycerides: 58 mg/dL (ref 0.0–149.0)
VLDL: 11.6 mg/dL (ref 0.0–40.0)

## 2022-08-28 LAB — HEMOGLOBIN A1C: Hgb A1c MFr Bld: 6.1 % (ref 4.6–6.5)

## 2022-08-28 NOTE — Progress Notes (Unsigned)
Subjective:    Patient ID: Lori Goodwin, female    DOB: April 03, 1946, 77 y.o.   MRN: [id number]  DOS:  08/28/2022 Type of visit - description: CPX  Here for CPX. Since the last office visit she is doing well.  Has no major concerns.  Review of Systems See above   Past Medical History:  Diagnosis Date   Allergic rhinitis    Allergy    SEASONAL   Common migraine    Hyperlipidemia    Hypertension     Past Surgical History:  Procedure Laterality Date   CARPAL TUNNEL RELEASE Left 01/05/2016   CARPAL TUNNEL RELEASE Right 04/2018   COLONOSCOPY     POLYPECTOMY     TYMPANOSTOMY TUBE PLACEMENT      Current Outpatient Medications  Medication Instructions   alendronate (FOSAMAX) 70 mg, Oral, Every 7 days, Take with a full glass of water on an empty stomach.   amLODipine (NORVASC) 5 mg, Oral, Daily   atorvastatin (LIPITOR) 10 MG tablet TAKE 1 TABLET(10 MG) BY MOUTH AT BEDTIME   Cholecalciferol (VITAMIN D) 2000 units CAPS 1 capsule, Oral   clobetasol (TEMOVATE) 0.05 % external solution Topical, Daily PRN   loratadine (CLARITIN) 10 MG tablet    Multiple Vitamin (MULTIVITAMIN WITH MINERALS) TABS 1 tablet, Daily   propranolol ER (INDERAL LA) 60 mg, Oral, Daily   SUMAtriptan (IMITREX) 50 MG tablet TAKE 1-2 TABLETS BY MOUTH AS NEEDED FOR MIGRAINE. MAY REPEAT IN 2 HOURS IF HEADACHE PERSISTS OR RECURS       Objective:   Physical Exam BP 126/68   Pulse (!) 42   Temp 97.7 F (36.5 C) (Oral)   Resp 16   Ht 5\' 2"  (1.575 m)   Wt 152 lb 2 oz (69 kg)   SpO2 97%   BMI 27.82 kg/m  General: Well developed, NAD, BMI noted Neck: No  thyromegaly  HEENT:  Normocephalic . Face symmetric, atraumatic Lungs:  CTA B Normal respiratory effort, no intercostal retractions, no accessory muscle use. Heart: RRR,  no murmur.  Abdomen:  Not distended, soft, non-tender. No rebound or rigidity.   Lower extremities: no pretibial edema bilaterally  Skin: Exposed areas without rash. Not pale.  Not jaundice Neurologic:  alert & oriented X3.  Speech normal, gait appropriate for age and unassisted Strength symmetric and appropriate for age.  Psych: Cognition and judgment appear intact.  Cooperative with normal attention span and concentration.  Behavior appropriate. No anxious or depressed appearing.     Assessment     Assessment  Prediabetes: A1c 6.0 (03-2015) Dyslipidemia HTN (on BB) MSK: --DJD --Neck pain: Saw neurosurgery, MRI was done, DX DJD, nonsurgical candidate (2013 ) Osteopenia first  T score -1.2 (06/2016) rx ca and vit D  Migraines  (onn BB and imitrex) Ear canal eczema: topical steroids prn  PLAN: Here for CPX  Prediabetes: Encouraged healthy lifestyle. Dyslipidemia: On atorvastatin, checking labs. Osteopenia: T-score increased to -2.3 (June 2023), recommended Fosamax, declined it.  We agreed on continue physical activity, vitamin D supplements, consider recheck DEXA in 2 years. Migraines: On beta-blockers, heart rate is slightly low but she is asymptomatic.  Hardly ever needs Imitrex. RTC 1 year  -Td 2021 -PNM 23: 2015; Prevnar: 2016  -zostavax 03-2015; s/p shingrix x2 -Vaccines are recommended: COVID booster, RSV, PNM 20, flu shot every fall  --Female care:  per Dr Lincoln Maxin ~ 05-2021,  MMG 08/2021 --[id number] 01-2015, + polyps, C-scope 02-2022, next per GI - diet, exercise: Discussed -  labs : CMP FLP CBC A1c -   Healthcare POA: Recommend to bring a copy    ==== Prediabetes: Checking labs Dyslipidemia: Started Cholesterol medication about a year ago, good results, recheck today. HTN: BP satisfactory, continue amlodipine and beta-blockers.  Recommend ambulatory BPs Migraines: Currently well controlled. Osteopenia: On vitamin D, recheck DEXA RTC 1 year    This visit occurred during the SARS-CoV-2 public health emergency.  Safety protocols were in pla

## 2022-08-28 NOTE — Patient Instructions (Addendum)
Vaccines I recommend: Covid booster RSV vaccine Pneumonia shot (PNM 20) Flu shot every fall   Please bring [place] a copy of your Healthcare Power of Attorney for your chart.       GO TO THE LAB : Get the blood work     GO TO THE FRONT DESK, PLEASE SCHEDULE YOUR APPOINTMENTS Come back for   a physical exam in 1 year

## 2022-08-29 ENCOUNTER — Encounter: Payer: Self-pay | Admitting: Internal Medicine

## 2022-08-29 NOTE — Assessment & Plan Note (Signed)
Here for CPX Prediabetes: Encouraged healthy lifestyle. Dyslipidemia: On atorvastatin, checking labs. Osteopenia: T-score decreased to -2.3 (June 2023), recommended Fosamax, declined .  We agreed on continue physical activity, vitamin D supplements, consider recheck DEXA in 2 years. Migraines: On beta-blockers, heart rate is slightly low but she is asymptomatic.  Hardly ever needs Imitrex. RTC 1 year

## 2022-08-29 NOTE — Assessment & Plan Note (Addendum)
-  Td 2021 -PNM 23: 2015; Prevnar: 2016  -zostavax 03-2015; s/p shingrix x2 -Vaccines are recommended: COVID booster, RSV, PNM 20, flu shot every fall --Female care:  per Dr Lincoln Maxin ~ 05-2021,  MMG 08/2021 --[id number] 01-2015, + polyps, C-scope 02-2022, next per GI - diet, exercise: Discussed - labs : CMP FLP CBC A1c -   Healthcare POA: Recommend to bring a copy

## 2022-09-01 DIAGNOSIS — Z1231 Encounter for screening mammogram for malignant neoplasm of breast: Secondary | ICD-10-CM | POA: Diagnosis not present

## 2022-09-28 ENCOUNTER — Encounter: Payer: Medicare PPO | Admitting: Internal Medicine

## 2022-10-03 ENCOUNTER — Other Ambulatory Visit: Payer: Self-pay | Admitting: Internal Medicine

## 2022-10-19 NOTE — Progress Notes (Deleted)
[facility] [address] [place] [address] Phone: [phone] Subjective:    I'm seeing this patient by the request  of:  Wanda Plump, MD  CC:   [id number]  05/01/2022 Discussed with patient at great length about the rotator cuff arthropathy.  Does have decrease in the acromioclavicular joint swelling but still has the significant in the glenohumeral joint.  We discussed potentially doing injections but would like to wait 10 weeks in between.  Patient is in agreement with this.  Discussed different treatment options including again advanced imaging.  Patient would like to avoid any surgical intervention.  Total time discussing with patient and reviewing patient's previous imaging and other notes 32 minutes   Updated 10/27/2022 MAHA FISCHEL is a 77 y.o. 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, 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    Employer: [employer]  Tobacco Use   Smoking status: Never    Passive exposure: Never   Smokeless tobacco: Never  Vaping Use   Vaping status: Never Used  Substance and Sexual Activity   Alcohol use: No   Drug use: No   Sexual activity: Not on file  Other Topics Concern   Not on file  Social History Narrative   Original from [place]   Divorced, remarried   Lives w/ husband   [name] her son lives in [place]     3 children  ,1 son is paraplegic , has another that is a MD,other a PA     Social Determinants of Corporate investment banker Strain: Low Risk  (10/15/2021)   Overall Financial Resource Strain (CARDIA)    Difficulty of Paying Living Expenses: Not hard at all  Food Insecurity: No Food Insecurity (10/15/2021)   Hunger Vital Sign    Worried About Running Out of Food in the Last Year: Never true    Ran Out of Food in the Last Year: Never true  Transportation Needs: No Transportation Needs (10/15/2021)   PRAPARE - Administrator, Civil Service (Medical): No    Lack of Transportation (Non-Medical): No  Physical Activity: Sufficiently Active (10/15/2021)   Exercise Vital Sign    Days of Exercise per Week: 7 days    Minutes of Exercise per Session: 30 min  Stress: No Stress Concern Present (10/15/2021)   [employer] of Occupational Health - Occupational Stress Questionnaire    Feeling of Stress : Not at all  Social Connections: Socially Integrated (10/15/2021)   Social Connection and Isolation Panel [NHANES]    Frequency of Communication with Friends and Family: More than three times a week    Frequency of Social Gatherings with Friends and Family: More than three times a week    Attends Religious  Services: More than 4 times per year    Active Member of Clubs or Organizations: Yes    Attends Engineer, structural: More than 4 times per year    Marital Status: Married   Allergies  Allergen Reactions   Tylenol [Acetaminophen] Shortness Of Breath   Family History  Problem Relation Age of Onset   Colon cancer Neg Hx    Breast cancer Neg Hx    Diabetes Neg Hx    Heart attack Neg Hx    Colon polyps Neg Hx    Crohn's disease Neg Hx    Esophageal cancer Neg Hx    Rectal cancer Neg Hx    Stomach cancer Neg Hx    Ulcerative colitis Neg Hx      Current Outpatient Medications (Cardiovascular):    amLODipine (NORVASC) 5 MG tablet, Take 1 tablet (5 mg total) by mouth daily.    atorvastatin (LIPITOR) 10 MG tablet, TAKE 1 TABLET(10 MG) BY MOUTH AT BEDTIME   propranolol ER (INDERAL LA) 60 MG 24 hr capsule, Take 1 capsule (60 mg total) by mouth daily.  Current Outpatient Medications (Respiratory):    loratadine (CLARITIN) 10 MG tablet,   Current Outpatient Medications (Analgesics):    SUMAtriptan (IMITREX) 50 MG tablet, TAKE 1-2 TABLETS BY MOUTH AS NEEDED FOR MIGRAINE. MAY REPEAT IN 2 HOURS IF HEADACHE PERSISTS OR RECURS   Current Outpatient Medications (Other):    Cholecalciferol (VITAMIN D) 2000 units CAPS, Take 1 capsule by mouth.   clobetasol (TEMOVATE) 0.05 % external solution, Apply topically daily as needed.   Multiple Vitamin (MULTIVITAMIN WITH MINERALS) TABS, Take 1 tablet by mouth daily.   Reviewed prior external information including notes and imaging from  primary care provider As well as notes that were available from care everywhere and other healthcare systems.  Past medical history, social, surgical and family history all reviewed in electronic medical record.  No pertanent information unless stated regarding to the chief complaint.   Review of Systems:  No headache, visual changes, nausea, vomiting, diarrhea, constipation, dizziness, abdominal pain, skin rash, fevers, chills, night sweats, weight loss, swollen lymph nodes, body aches, joint swelling, chest pain, shortness of breath, mood changes. POSITIVE muscle aches  Objective  There were no vitals taken for this visit.   General: No apparent distress alert and oriented x3 mood and affect normal, dressed appropriately.  HEENT: Pupils equal, extraocular movements intact  Respiratory: Patient's speak in full sentences and does not appear short of breath  Cardiovascular: No lower extremity edema, non tender, no erythema      Impression and Recommendations:

## 2022-10-27 ENCOUNTER — Ambulatory Visit: Payer: Medicare PPO | Admitting: Family Medicine

## 2022-11-29 ENCOUNTER — Other Ambulatory Visit: Payer: Self-pay | Admitting: Internal Medicine

## 2023-01-05 ENCOUNTER — Telehealth: Payer: Self-pay | Admitting: Internal Medicine

## 2023-01-05 NOTE — Telephone Encounter (Signed)
Copied from CRM [id number]. Topic: Medicare AWV >> Jan 05, 2023  2:01 PM Payton Doughty wrote: Reason for CRM: Called LM 01/05/2023 to schedule AWV   Verlee Rossetti; Care Guide Ambulatory Clinical Support [facility] Direct Dial: [phone]

## 2023-03-23 ENCOUNTER — Ambulatory Visit: Payer: Medicare PPO | Admitting: Family Medicine

## 2023-03-29 ENCOUNTER — Ambulatory Visit: Payer: Medicare PPO | Admitting: Internal Medicine

## 2023-03-30 ENCOUNTER — Ambulatory Visit: Payer: Medicare PPO | Admitting: Internal Medicine

## 2023-04-03 ENCOUNTER — Other Ambulatory Visit: Payer: Self-pay | Admitting: Internal Medicine

## 2023-04-30 ENCOUNTER — Ambulatory Visit: Payer: Medicare PPO | Admitting: Family Medicine

## 2023-07-30 DIAGNOSIS — H1131 Conjunctival hemorrhage, right eye: Secondary | ICD-10-CM | POA: Diagnosis not present

## 2023-08-31 ENCOUNTER — Encounter: Payer: Medicare PPO | Admitting: Internal Medicine

## 2023-09-06 ENCOUNTER — Other Ambulatory Visit: Payer: Self-pay | Admitting: Internal Medicine

## 2023-09-08 ENCOUNTER — Encounter: Payer: Self-pay | Admitting: Internal Medicine

## 2023-09-08 ENCOUNTER — Ambulatory Visit (INDEPENDENT_AMBULATORY_CARE_PROVIDER_SITE_OTHER): Admitting: Internal Medicine

## 2023-09-08 VITALS — BP 126/70 | HR 52 | Temp 98.2°F | Resp 16 | Ht 62.0 in | Wt 157.2 lb

## 2023-09-08 DIAGNOSIS — E785 Hyperlipidemia, unspecified: Secondary | ICD-10-CM | POA: Diagnosis not present

## 2023-09-08 DIAGNOSIS — Z0000 Encounter for general adult medical examination without abnormal findings: Secondary | ICD-10-CM | POA: Diagnosis not present

## 2023-09-08 DIAGNOSIS — R739 Hyperglycemia, unspecified: Secondary | ICD-10-CM | POA: Diagnosis not present

## 2023-09-08 DIAGNOSIS — Z23 Encounter for immunization: Secondary | ICD-10-CM | POA: Diagnosis not present

## 2023-09-08 DIAGNOSIS — M8580 Other specified disorders of bone density and structure, unspecified site: Secondary | ICD-10-CM | POA: Diagnosis not present

## 2023-09-08 DIAGNOSIS — I10 Essential (primary) hypertension: Secondary | ICD-10-CM | POA: Diagnosis not present

## 2023-09-08 DIAGNOSIS — Z0001 Encounter for general adult medical examination with abnormal findings: Secondary | ICD-10-CM

## 2023-09-08 LAB — ALT: ALT: 18 U/L (ref 0–35)

## 2023-09-08 LAB — CBC WITH DIFFERENTIAL/PLATELET
Basophils Absolute: 0 10*3/uL (ref 0.0–0.1)
Basophils Relative: 0.8 % (ref 0.0–3.0)
Eosinophils Absolute: 0.3 10*3/uL (ref 0.0–0.7)
Eosinophils Relative: 5 % (ref 0.0–5.0)
HCT: 40.1 % (ref 36.0–46.0)
Hemoglobin: 13.4 g/dL (ref 12.0–15.0)
Lymphocytes Relative: 45.6 % (ref 12.0–46.0)
Lymphs Abs: 2.3 10*3/uL (ref 0.7–4.0)
MCHC: 33.4 g/dL (ref 30.0–36.0)
MCV: 93.1 fl (ref 78.0–100.0)
Monocytes Absolute: 0.4 10*3/uL (ref 0.1–1.0)
Monocytes Relative: 6.9 % (ref 3.0–12.0)
Neutro Abs: 2.1 10*3/uL (ref 1.4–7.7)
Neutrophils Relative %: 41.7 % — ABNORMAL LOW (ref 43.0–77.0)
Platelets: 206 10*3/uL (ref 150.0–400.0)
RBC: 4.31 Mil/uL (ref 3.87–5.11)
RDW: 12.3 % (ref 11.5–15.5)
WBC: 5.1 10*3/uL (ref 4.0–10.5)

## 2023-09-08 LAB — BASIC METABOLIC PANEL WITH GFR
BUN: 20 mg/dL (ref 6–23)
CO2: 29 meq/L (ref 19–32)
Calcium: 9.7 mg/dL (ref 8.4–10.5)
Chloride: 105 meq/L (ref 96–112)
Creatinine, Ser: 0.79 mg/dL (ref 0.40–1.20)
GFR: 72.32 mL/min (ref >60.00)
Glucose, Bld: 99 mg/dL (ref 70–99)
Potassium: 4.8 meq/L (ref 3.5–5.1)
Sodium: 139 meq/L (ref 135–145)

## 2023-09-08 LAB — LIPID PANEL
Cholesterol: 136 mg/dL (ref 0–200)
HDL: 55.7 mg/dL (ref >39.00)
LDL Cholesterol: 64 mg/dL (ref 0–99)
NonHDL: 79.96
Total CHOL/HDL Ratio: 2
Triglycerides: 82 mg/dL (ref 0.0–149.0)
VLDL: 16.4 mg/dL (ref 0.0–40.0)

## 2023-09-08 LAB — AST: AST: 20 U/L (ref 0–37)

## 2023-09-08 LAB — HEMOGLOBIN A1C: Hgb A1c MFr Bld: 6.1 % (ref 4.6–6.5)

## 2023-09-08 NOTE — Patient Instructions (Signed)
 Will arrange for a bone density test (currently our machine is down, should be okay in few weeks)  Check the  blood pressure regularly Blood pressure goal:  between 110/65 and  135/85. If it is consistently higher or lower, let me know     GO TO THE LAB :  Get the blood work   Your results will be posted on MyChart with my comments  Next office visit for a physical exam in 1 year Please make an appointment before you leave today      "Health Care Power of attorney" (Also know as a  "Living will" or  Advance care planning documents)  If you already have a living will or healthcare power of attorney, is recommended you bring the copy to be scanned in your chart.   The document will be available to all the doctors you see in the system.  If you are over 42 y/o and don't have the document, please read:  Advance care planning is a process that supports adults in  understanding and sharing their preferences regarding future medical care.  The patient's preferences are recorded in documents called Advance Directives and the can be modified at any time while the patient is in full mental capacity.     More information at: [email or web address]

## 2023-09-08 NOTE — Progress Notes (Unsigned)
   Subjective:    Patient ID: Lori Goodwin, female    DOB: 05/11/1945, 78 y.o.   MRN: [id number]  DOS:  09/08/2023 Type of visit - description: Here for CPX  Here for CPX. Feeling well and has no concerns.   Review of Systems See above   Past Medical History:  Diagnosis Date   Allergic rhinitis    Allergy    SEASONAL   Common migraine    Hyperlipidemia    Hypertension     Past Surgical History:  Procedure Laterality Date   CARPAL TUNNEL RELEASE Left 01/05/2016   CARPAL TUNNEL RELEASE Right 04/2018   COLONOSCOPY     POLYPECTOMY     TYMPANOSTOMY TUBE PLACEMENT      Current Outpatient Medications  Medication Instructions   amLODipine  (NORVASC ) 5 mg, Oral, Daily   atorvastatin  (LIPITOR) 10 mg, Oral, Daily at bedtime   Cholecalciferol (VITAMIN D) 2000 units CAPS 1 capsule   clobetasol  (TEMOVATE ) 0.05 % external solution Topical, Daily PRN   loratadine (CLARITIN) 10 MG tablet    Multiple Vitamin (MULTIVITAMIN WITH MINERALS) TABS 1 tablet, Daily   propranolol  ER (INDERAL  LA) 60 mg, Oral, Daily   SUMAtriptan  (IMITREX ) 50 MG tablet TAKE 1-2 TABLETS BY MOUTH AS NEEDED FOR MIGRAINE. MAY REPEAT IN 2 HOURS IF HEADACHE PERSISTS OR RECURS       Objective:   Physical Exam BP 126/70   Pulse (!) 52   Temp 98.2 F (36.8 C) (Oral)   Resp 16   Ht 5\' 2"  (1.575 m)   Wt 157 lb 4 oz (71.3 kg)   SpO2 97%   BMI 28.76 kg/m  General: Well developed, NAD, BMI noted Neck: No  thyromegaly  HEENT:  Normocephalic . Face symmetric, atraumatic Lungs:  CTA B Normal respiratory effort, no intercostal retractions, no accessory muscle use. Heart: RRR,  no murmur.  Abdomen:  Not distended, soft, non-tender. No rebound or rigidity.   Lower extremities: no pretibial edema bilaterally  Skin: Exposed areas without rash. Not pale. Not jaundice Neurologic:  alert & oriented X3.  Speech normal, gait appropriate for age and unassisted Strength symmetric and appropriate for age.   Psych: Cognition and judgment appear intact.  Cooperative with normal attention span and concentration.  Behavior appropriate. No anxious or depressed appearing.     Assessment   Assessment  Prediabetes: A1c 6.0 (03-2015) Dyslipidemia HTN (on BB) MSK: --DJD --Neck pain: Saw neurosurgery, MRI was done, DX DJD, nonsurgical candidate (2013 ) Osteopenia first  T score -1.2 (06/2016), -2.3 ( June 2023) Migraines  (onn BB and imitrex ) Ear canal eczema: topical steroids prn  PLAN: Here for CPX -Td 2021 -PNM 23: 2015; Prevnar: 2016.  PNM 20 today -zostavax 03-2015; s/p shingrix  x2 -Vaccines are recommended: COVID booster if not done recently, RSV,  flu shot every fall --Female care:  per Dr Leslee Rase, to be seen tomorrow, has  MMGs there  --LKG:[id number] 01-2015, + polyps, C-scope 02-2022, next per GI -Lifestyle healthy, takes a walk 5 times a week. - labs: BMP AST ALT FLP CBC A1c -   Healthcare POA: Recommend to bring a copy  Other issues addressed today Prediabetes: Check A1c, has a healthy lifestyle. Dyslipidemia: On atorvastatin , checking labs. HTN: On amlodipine  and Inderal  (mostly for migraines) Migraines: Controlled on BBs. Osteopenia: T-score -2.3 (June 2023), declined Fosamax , she remains active, last vitamin D levels very good.  Check a DEXA RTC 1 year.  Sooner if needed.

## 2023-09-09 ENCOUNTER — Encounter: Payer: Self-pay | Admitting: Internal Medicine

## 2023-09-09 ENCOUNTER — Ambulatory Visit: Payer: Self-pay | Admitting: Internal Medicine

## 2023-09-09 DIAGNOSIS — Z01419 Encounter for gynecological examination (general) (routine) without abnormal findings: Secondary | ICD-10-CM | POA: Diagnosis not present

## 2023-09-09 DIAGNOSIS — M8580 Other specified disorders of bone density and structure, unspecified site: Secondary | ICD-10-CM | POA: Insufficient documentation

## 2023-09-09 DIAGNOSIS — Z1231 Encounter for screening mammogram for malignant neoplasm of breast: Secondary | ICD-10-CM | POA: Diagnosis not present

## 2023-09-09 LAB — HM MAMMOGRAPHY

## 2023-09-09 NOTE — Assessment & Plan Note (Signed)
 Here for CPX -Td 2021 -PNM 23: 2015; Prevnar: 2016.  PNM 20 today -zostavax 03-2015; s/p shingrix  x2 -Recommended: COVID booster if not done recently, RSV,  flu shot every fall --Female care:  per Dr Leslee Rase, to be seen tomorrow, has  MMGs there  --WUJ:[id number] 01-2015, + polyps, C-scope 02-2022, next per GI -Lifestyle healthy, takes a walk 5 times a week. - labs: BMP AST ALT FLP CBC A1c -   Healthcare POA: Recommend to bring a copy

## 2023-09-09 NOTE — Assessment & Plan Note (Signed)
 Here for CPX  Other issues addressed today Prediabetes: Check A1c, has a healthy lifestyle. Dyslipidemia: On atorvastatin , checking labs. HTN: On amlodipine  and Inderal  (mostly for migraines) Migraines: Controlled on BBs. Osteopenia: T-score -2.3 (June 2023), declined Fosamax , she remains active, last vitamin D levels very good.  Check a DEXA (once the equipment  is operative again in few weeks) RTC 1 year.  Sooner if needed.

## 2023-09-22 ENCOUNTER — Encounter: Payer: Self-pay | Admitting: Internal Medicine

## 2023-10-02 ENCOUNTER — Other Ambulatory Visit: Payer: Self-pay | Admitting: Internal Medicine

## 2023-10-22 ENCOUNTER — Encounter: Payer: Self-pay | Admitting: Advanced Practice Midwife

## 2023-11-08 ENCOUNTER — Other Ambulatory Visit: Payer: Self-pay

## 2023-11-08 DIAGNOSIS — M8580 Other specified disorders of bone density and structure, unspecified site: Secondary | ICD-10-CM

## 2023-11-08 DIAGNOSIS — Z780 Asymptomatic menopausal state: Secondary | ICD-10-CM

## 2023-11-10 ENCOUNTER — Ambulatory Visit: Admitting: Professional Counselor

## 2023-12-28 ENCOUNTER — Ambulatory Visit (INDEPENDENT_AMBULATORY_CARE_PROVIDER_SITE_OTHER)

## 2023-12-28 VITALS — Ht 62.0 in | Wt 157.0 lb

## 2023-12-28 DIAGNOSIS — Z0000 Encounter for general adult medical examination without abnormal findings: Secondary | ICD-10-CM

## 2023-12-28 NOTE — Progress Notes (Signed)
 Subjective:   Lori Goodwin is a 78 y.o. who presents for a Medicare Wellness preventive visit.  As a reminder, Annual Wellness Visits don't include a physical exam, and some assessments may be limited, especially if this visit is performed virtually. We may recommend an in-person follow-up visit with your provider if needed.  Visit Complete: Virtual I connected with  Lori Goodwin on 12/28/23 by a audio enabled telemedicine application and verified that I am speaking with the correct person using two identifiers.  Patient Location: Home  Provider Location: Home Office  I discussed the limitations of evaluation and management by telemedicine. The patient expressed understanding and agreed to proceed.  Vital Signs: Because this visit was a virtual/telehealth visit, some criteria may be missing or patient reported. Any vitals not documented were not able to be obtained and vitals that have been documented are patient reported.    Persons Participating in Visit: Patient.  AWV Questionnaire: No: Patient Medicare AWV questionnaire was not completed prior to this visit.  Cardiac Risk Factors include: advanced age (>80men, >51 women);hypertension     Objective:    Today's Vitals   12/28/23 1352  Weight: 157 lb (71.2 kg)  Height: 5' 2 (1.575 m)   Body mass index is 28.72 kg/m.     12/28/2023    1:57 PM 10/15/2021    9:06 AM 04/10/2021   11:17 AM 07/03/2020    8:52 AM 03/20/2020    8:42 AM 12/07/2019    8:08 AM 09/27/2019   10:13 AM  Advanced Directives  Does Patient Have a Medical Advance Directive? Yes Yes Yes Yes Yes Yes Yes  Type of Estate agent of [place];Living will Healthcare Power of [place];Out of facility DNR (pink MOST or yellow form);Living will Healthcare Power of [place];Living will Healthcare Power of [place];Living will Healthcare Power of [place];Living will Living will Living will  Does patient want to make changes to medical  advance directive?  No - Patient declined No - Patient declined   No - Patient declined   Copy of Healthcare Power of Attorney in Chart? No - copy requested No - copy requested Yes - validated most recent copy scanned in chart (See row information) No - copy requested No - copy requested      Current Medications (verified) Outpatient Encounter Medications as of 12/28/2023  Medication Sig   amLODipine  (NORVASC ) 5 MG tablet Take 1 tablet (5 mg total) by mouth daily.   atorvastatin  (LIPITOR) 10 MG tablet Take 1 tablet (10 mg total) by mouth at bedtime.   Cholecalciferol (VITAMIN D) 2000 units CAPS Take 1 capsule by mouth.   clobetasol  (TEMOVATE ) 0.05 % external solution Apply topically daily as needed. (Patient not taking: Reported on 09/08/2023)   loratadine (CLARITIN) 10 MG tablet  (Patient not taking: Reported on 09/08/2023)   Multiple Vitamin (MULTIVITAMIN WITH MINERALS) TABS Take 1 tablet by mouth daily.   propranolol  ER (INDERAL  LA) 60 MG 24 hr capsule Take 1 capsule (60 mg total) by mouth daily.   SUMAtriptan  (IMITREX ) 50 MG tablet TAKE 1-2 TABLETS BY MOUTH AS NEEDED FOR MIGRAINE. MAY REPEAT IN 2 HOURS IF HEADACHE PERSISTS OR RECURS (Patient not taking: Reported on 09/08/2023)   No facility-administered encounter medications on file as of 12/28/2023.    Allergies (verified) Tylenol [acetaminophen]   History: Past Medical History:  Diagnosis Date   Allergic rhinitis    Allergy    SEASONAL   Common migraine    Hyperlipidemia  Hypertension    Past Surgical History:  Procedure Laterality Date   CARPAL TUNNEL RELEASE Left 01/05/2016   CARPAL TUNNEL RELEASE Right 04/2018   COLONOSCOPY     POLYPECTOMY     TYMPANOSTOMY TUBE PLACEMENT     Family History  Problem Relation Age of Onset   Colon cancer Neg Hx    Breast cancer Neg Hx    Diabetes Neg Hx    Heart attack Neg Hx    Colon polyps Neg Hx    Crohn's disease Neg Hx    Esophageal cancer Neg Hx    Rectal cancer Neg Hx     Stomach cancer Neg Hx    Ulcerative colitis Neg Hx    Social History   Socioeconomic History   Marital status: Married    Spouse name: Not on file   Number of children: 3   Years of education: Not on file   Highest education level: Not on file  Occupational History   Occupation: TEACHER, 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    Employer: [employer]  Tobacco Use   Smoking status: Never    Passive exposure: Never   Smokeless tobacco: Never  Vaping Use   Vaping status: Never Used  Substance and Sexual Activity   Alcohol use: No   Drug use: No   Sexual activity: Not on file  Other Topics Concern   Not on file  Social History Narrative   Original from [place]   Divorced, remarried   Lives w/ husband   [name] her son lives in [place]     3 children ,1 son is paraplegic , has another that is a MD,other a PA     Social Drivers of Corporate investment banker Strain: Low Risk  (12/28/2023)   Overall Financial Resource Strain (CARDIA)    Difficulty of Paying Living Expenses: Not hard at all  Food Insecurity: No Food Insecurity (12/28/2023)   Hunger Vital Sign    Worried About Running Out of Food in the Last Year: Never true    Ran Out of Food in the Last Year: Never true  Transportation Needs: No Transportation Needs (12/28/2023)   PRAPARE - Administrator, Civil Service (Medical): No    Lack of Transportation (Non-Medical): No  Physical Activity: Sufficiently Active (12/28/2023)   Exercise Vital Sign    Days of Exercise per Week: 4 days    Minutes of Exercise per Session: 50 min  Stress: No Stress Concern Present (12/28/2023)   [employer] of Occupational Health - Occupational Stress Questionnaire    Feeling of Stress: Not at all  Social Connections: Socially Integrated (12/28/2023)   Social Connection and Isolation Panel    Frequency of Communication with Friends and Family: More than three times a week    Frequency of Social Gatherings with Friends and Family: More  than three times a week    Attends Religious Services: More than 4 times per year    Active Member of [employer] or Organizations: Yes    Attends Engineer, structural: More than 4 times per year    Marital Status: Married    Tobacco Counseling Counseling given: Not Answered    Clinical Intake:  Pre-visit preparation completed: Yes  Pain : No/denies pain     BMI - recorded: 28.72 Nutritional Status: BMI 25 -29 Overweight Nutritional Risks: None Diabetes: No  Lab Results  Component Value Date   HGBA1C 6.1 09/08/2023   HGBA1C 6.1 08/28/2022   HGBA1C  6.3 07/10/2021     How often do you need to have someone help you when you read instructions, pamphlets, or other written materials from your doctor or pharmacy?: 1 - Never  Interpreter Needed?: No  Information entered by :: Rojelio Blush LPN   Activities of Daily Living     12/28/2023    1:56 PM 09/08/2023   11:23 AM  In your present state of health, do you have any difficulty performing the following activities:  Hearing? 0 0  Vision? 0 0  Difficulty concentrating or making decisions? 0 0  Walking or climbing stairs? 0 0  Dressing or bathing? 0 0  Doing errands, shopping? 0 0  Preparing Food and eating ? N   Using the Toilet? N   In the past six months, have you accidently leaked urine? N   Do you have problems with loss of bowel control? N   Managing your Medications? N   Managing your Finances? N   Housekeeping or managing your Housekeeping? N     Patient Care Team: Amon Aloysius BRAVO, MD as PCP - General Teressa Toribio SQUIBB, MD (Inactive) as Attending Physician (Gastroenterology) Sarrah Browning, MD as Consulting Physician (Obstetrics and Gynecology) Kit Rush, MD as Consulting Physician (Orthopedic Surgery)  I have updated your Care Teams any recent Medical Services you may have received from other providers in the past year.     Assessment:   This is a routine wellness examination for  Clytie.  Hearing/Vision screen Hearing Screening - Comments:: Denies hearing difficulties   Vision Screening - Comments:: Wears rx glasses - up to date with routine eye exams with  [facility]   Goals Addressed               This Visit's Progress     Increase physical activity (pt-stated)        Remain active       Depression Screen     12/28/2023    1:56 PM 09/08/2023   11:22 AM 08/28/2022   11:00 AM 10/15/2021    9:06 AM 07/10/2021    8:16 AM 01/07/2021    8:37 AM 07/08/2020    8:31 AM  PHQ 2/9 Scores  PHQ - 2 Score 0 0 0 0 0 0 0    Fall Risk     12/28/2023    1:57 PM 09/08/2023   11:22 AM 08/28/2022   11:00 AM 10/15/2021    9:06 AM 07/10/2021    8:16 AM  Fall Risk   Falls in the past year? 0 0 1 0 0  Number falls in past yr: 0 0 0 0 0  Injury with Fall? 0 0 0 0 0  Risk for fall due to : No Fall Risks   No Fall Risks No Fall Risks  Follow up Falls evaluation completed Falls evaluation completed;Education provided Falls evaluation completed Falls evaluation completed  Falls evaluation completed      Data saved with a previous flowsheet row definition    MEDICARE RISK AT HOME:  Medicare Risk at Home Any stairs in or around the home?: Yes If so, are there any without handrails?: No Home free of loose throw rugs in walkways, pet beds, electrical cords, etc?: Yes Adequate lighting in your home to reduce risk of falls?: Yes Life alert?: No Use of a cane, walker or w/c?: No Grab bars in the bathroom?: Yes Shower chair or bench in shower?: No Elevated toilet seat or a handicapped toilet?: No  TIMED UP AND GO:  Was the test performed?  No  Cognitive Function: 6CIT completed        12/28/2023    1:57 PM 10/15/2021    9:11 AM  6CIT Screen  What Year? 0 points 0 points  What month? 0 points 0 points  What time? 0 points 0 points  Count back from 20 0 points 0 points  Months in reverse 0 points 0 points  Repeat phrase 0 points 0 points  Total Score 0 points 0  points    Immunizations Immunization History  Administered Date(s) Administered   Fluad Quad(high Dose 65+) 02/02/2020, 01/07/2021   Influenza, Seasonal, Injecte, Preservative Fre 02/19/2015   Influenza,inj,Quad PF,6+ Mos 02/07/2019   Influenza-Unspecified 02/20/2017   PFIZER(Purple Top)SARS-COV-2 Vaccination 05/12/2019, 06/02/2019, 01/24/2020   PNEUMOCOCCAL CONJUGATE-20 09/08/2023   Pneumococcal Conjugate-13 03/18/2015   Pneumococcal Polysaccharide-23 02/19/2014   Td 08/20/2009   Tdap 11/10/2019   Zoster Recombinant(Shingrix ) 10/13/2018, 12/27/2018   Zoster, Live 03/28/2015    Screening Tests Health Maintenance  Topic Date Due   Influenza Vaccine  11/05/2023   COVID-19 Vaccine (4 - 2025-26 season) 12/06/2023   Medicare Annual Wellness (AWV)  12/27/2024   DTaP/Tdap/Td (3 - Td or Tdap) 11/09/2029   Pneumococcal Vaccine: 50+ Years  Completed   DEXA SCAN  Completed   Hepatitis C Screening  Completed   Zoster Vaccines- Shingrix   Completed   HPV VACCINES  Aged Out   Meningococcal B Vaccine  Aged Out   Mammogram  Discontinued   Colonoscopy  Discontinued    Health Maintenance Items Addressed:   Additional Screening:  Vision Screening: Recommended annual ophthalmology exams for early detection of glaucoma and other disorders of the eye. Is the patient up to date with their annual eye exam?  Yes  Who is the provider or what is the name of the office in which the patient attends annual eye exams? [facility]  Dental Screening: Recommended annual dental exams for proper oral Nurse, mental health Referral / Chronic Care Management: CRR required this visit?  No   CCM required this visit?  No   Plan:    I have personally reviewed and noted the following in the patient's chart:   Medical and social history Use of alcohol, tobacco or illicit drugs  Current medications and supplements including opioid prescriptions. Patient is not currently taking opioid  prescriptions. Functional ability and status Nutritional status Physical activity Advanced directives List of other physicians Hospitalizations, surgeries, and ER visits in previous 12 months Vitals Screenings to include cognitive, depression, and falls Referrals and appointments  In addition, I have reviewed and discussed with patient certain preventive protocols, quality metrics, and best practice recommendations. A written personalized care plan for preventive services as well as general preventive health recommendations were provided to patient.   Rojelio LELON Blush, LPN   [id number]   After Visit Summary: (MyChart) Due to this being a telephonic visit, the after visit summary with patients personalized plan was offered to patient via MyChart   Notes: Nothing significant to report at this time.

## 2023-12-28 NOTE — Patient Instructions (Addendum)
 [name],  Thank you for taking the time for your Medicare Wellness Visit. I appreciate your continued commitment to your health goals. Please review the care plan we discussed, and feel free to reach out if I can assist you further.  Medicare recommends these wellness visits once per year to help you and your care team stay ahead of potential health issues. These visits are designed to focus on prevention, allowing your provider to concentrate on managing your acute and chronic conditions during your regular appointments.  Please note that Annual Wellness Visits do not include a physical exam. Some assessments may be limited, especially if the visit was conducted virtually. If needed, we may recommend a separate in-person follow-up with your provider.  Ongoing Care Seeing your primary care provider every 3 to 6 months helps us  monitor your health and provide consistent, personalized care.   Referrals If a referral was made during today's visit and you haven't received any updates within two weeks, please contact the referred provider directly to check on the status.  Recommended Screenings:  Health Maintenance  Topic Date Due   Flu Shot  11/05/2023   COVID-19 Vaccine (4 - 2025-26 season) 12/06/2023   Medicare Annual Wellness Visit  12/27/2024   DTaP/Tdap/Td vaccine (3 - Td or Tdap) 11/09/2029   Pneumococcal Vaccine for age over 57  Completed   DEXA scan (bone density measurement)  Completed   Hepatitis C Screening  Completed   Zoster (Shingles) Vaccine  Completed   HPV Vaccine  Aged Out   Meningitis B Vaccine  Aged Out   Breast Cancer Screening  Discontinued   Colon Cancer Screening  Discontinued       12/28/2023    1:57 PM  Advanced Directives  Does Patient Have a Medical Advance Directive? Yes  Type of Estate agent of [place];Living will  Copy of Healthcare Power of Attorney in Chart? No - copy requested   Advance Care Planning is important because  it: Ensures you receive medical care that aligns with your values, goals, and preferences. Provides guidance to your family and loved ones, reducing the emotional burden of decision-making during critical moments.  Vision: Annual vision screenings are recommended for early detection of glaucoma, cataracts, and diabetic retinopathy. These exams can also reveal signs of chronic conditions such as diabetes and high blood pressure.  Dental: Annual dental screenings help detect early signs of oral cancer, gum disease, and other conditions linked to overall health, including heart disease and diabetes.  Please see the attached documents for additional preventive care recommendations.

## 2024-01-01 ENCOUNTER — Other Ambulatory Visit: Payer: Self-pay | Admitting: Internal Medicine

## 2024-01-11 ENCOUNTER — Ambulatory Visit: Admitting: Family

## 2024-01-11 VITALS — BP 156/84 | HR 60 | Temp 97.8°F | Resp 16 | Ht 62.0 in | Wt 157.0 lb

## 2024-01-11 DIAGNOSIS — I10 Essential (primary) hypertension: Secondary | ICD-10-CM | POA: Diagnosis not present

## 2024-01-11 DIAGNOSIS — J189 Pneumonia, unspecified organism: Secondary | ICD-10-CM

## 2024-01-11 MED ORDER — BENZONATATE 100 MG PO CAPS: 100.0000 mg | ORAL_CAPSULE | Freq: Three times a day (TID) | ORAL | 0 refills | Status: DC | PRN

## 2024-01-11 MED ORDER — AMOXICILLIN-POT CLAVULANATE 875-125 MG PO TABS: 1.0000 | ORAL_TABLET | Freq: Two times a day (BID) | ORAL | 0 refills | Status: DC

## 2024-01-11 MED ORDER — CLOBETASOL PROPIONATE 0.05 % EX SOLN: Freq: Every day | CUTANEOUS | 1 refills | Status: AC | PRN

## 2024-01-11 MED ORDER — AZITHROMYCIN 250 MG PO TABS: ORAL_TABLET | ORAL | 0 refills | Status: AC

## 2024-01-11 NOTE — Patient Instructions (Signed)
 VISIT SUMMARY:  Today, you came in because of a cough and drainage that has been getting worse over the past four days. You also mentioned feeling fatigued and experiencing some wheezing.  YOUR PLAN:  COMMUNITY-ACQUIRED PNEUMONIA: You have been diagnosed with community-acquired pneumonia based on your symptoms and lung sounds. -You have been prescribed Augmentin and azithromycin  to treat the infection. -You have been prescribed Tessalon  Perles to help with your cough.

## 2024-01-11 NOTE — Progress Notes (Unsigned)
 Subjective:     Patient ID: Lori Goodwin, female    DOB: May 08, 1945, 78 y.o.   MRN: [id number]  Chief Complaint  Patient presents with   Cough    Patient complains of severe cough for 4 days    Cough    Discussed the use of AI scribe software for clinical note transcription with the patient, who gave verbal consent to proceed.  History of Present Illness  Lori Goodwin is a 78 year old female who presents with a cough and drainage for four days.  She experiences a progressively worsening cough and drainage, initially mild but now significant, over the past four days. There is no fever, but she occasionally feels cold. She experiences fatigue, which she attributes to caring for her son. There is no sore throat or ear pain, but she experiences a little wheezing. Her husband was sick first, but his symptoms were not as severe. She has not taken antibiotics recently but has previously taken azithromycin  without issues.     Health Maintenance Due  Topic Date Due   Influenza Vaccine  11/05/2023   COVID-19 Vaccine (4 - 2025-26 season) 12/06/2023    Past Medical History:  Diagnosis Date   Allergic rhinitis    Allergy    SEASONAL   Common migraine    Hyperlipidemia    Hypertension     Past Surgical History:  Procedure Laterality Date   CARPAL TUNNEL RELEASE Left 01/05/2016   CARPAL TUNNEL RELEASE Right 04/2018   COLONOSCOPY     POLYPECTOMY     TYMPANOSTOMY TUBE PLACEMENT      Family History  Problem Relation Age of Onset   Colon cancer Neg Hx    Breast cancer Neg Hx    Diabetes Neg Hx    Heart attack Neg Hx    Colon polyps Neg Hx    Crohn's disease Neg Hx    Esophageal cancer Neg Hx    Rectal cancer Neg Hx    Stomach cancer Neg Hx    Ulcerative colitis Neg Hx     Social History   Socioeconomic History   Marital status: Married    Spouse name: Not on file   Number of children: 3   Years of education: Not on file   Highest  education level: Not on file  Occupational History   Occupation: TEACHER, 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    Employer: [employer]  Tobacco Use   Smoking status: Never    Passive exposure: Never   Smokeless tobacco: Never  Vaping Use   Vaping status: Never Used  Substance and Sexual Activity   Alcohol use: No   Drug use: No   Sexual activity: Not on file  Other Topics Concern   Not on file  Social History Narrative   Original from [place]   Divorced, remarried   Lives w/ husband   [name] her son lives in [place]     3 children ,1 son is paraplegic , has another that is a MD,other a PA     Social Drivers of Corporate investment banker Strain: Low Risk  (12/28/2023)   Overall Financial Resource Strain (CARDIA)    Difficulty of Paying Living Expenses: Not hard at all  Food Insecurity: No Food Insecurity (12/28/2023)   Hunger Vital Sign    Worried About Running Out of Food in the Last Year: Never true    Ran Out of Food in the Last Year: Never true  Transportation  Needs: No Transportation Needs (12/28/2023)   PRAPARE - Administrator, Civil Service (Medical): No    Lack of Transportation (Non-Medical): No  Physical Activity: Sufficiently Active (12/28/2023)   Exercise Vital Sign    Days of Exercise per Week: 4 days    Minutes of Exercise per Session: 50 min  Stress: No Stress Concern Present (12/28/2023)   [employer] of Occupational Health - Occupational Stress Questionnaire    Feeling of Stress: Not at all  Social Connections: Socially Integrated (12/28/2023)   Social Connection and Isolation Panel    Frequency of Communication with Friends and Family: More than three times a week    Frequency of Social Gatherings with Friends and Family: More than three times a week    Attends Religious Services: More than 4 times per year    Active Member of [employer] or Organizations: Yes    Attends Engineer, structural: More than 4 times per year    Marital Status:  Married  Catering manager Violence: Not At Risk (12/28/2023)   Humiliation, Afraid, Rape, and Kick questionnaire    Fear of Current or Ex-Partner: No    Emotionally Abused: No    Physically Abused: No    Sexually Abused: No    Outpatient Medications Prior to Visit  Medication Sig Dispense Refill   amLODipine  (NORVASC ) 5 MG tablet Take 1 tablet (5 mg total) by mouth daily. 90 tablet 1   atorvastatin  (LIPITOR) 10 MG tablet Take 1 tablet (10 mg total) by mouth at bedtime. 90 tablet 2   Cholecalciferol (VITAMIN D) 2000 units CAPS Take 1 capsule by mouth.     loratadine (CLARITIN) 10 MG tablet  (Patient not taking: Reported on 09/08/2023)     Multiple Vitamin (MULTIVITAMIN WITH MINERALS) TABS Take 1 tablet by mouth daily.     propranolol  ER (INDERAL  LA) 60 MG 24 hr capsule Take 1 capsule (60 mg total) by mouth daily. 90 capsule 1   SUMAtriptan  (IMITREX ) 50 MG tablet TAKE 1-2 TABLETS BY MOUTH AS NEEDED FOR MIGRAINE. MAY REPEAT IN 2 HOURS IF HEADACHE PERSISTS OR RECURS (Patient not taking: Reported on 09/08/2023) 30 tablet 2   clobetasol  (TEMOVATE ) 0.05 % external solution Apply topically daily as needed. (Patient not taking: Reported on 09/08/2023) 50 mL 1   No facility-administered medications prior to visit.    Allergies  Allergen Reactions   Tylenol [Acetaminophen] Shortness Of Breath    Review of Systems  Respiratory:  Positive for cough.        Objective:    Physical Exam Constitutional:      Appearance: Normal appearance.  HENT:     Right Ear: There is impacted cerumen.     Left Ear: There is impacted cerumen.     Mouth/Throat:     Mouth: Mucous membranes are moist.     Tongue: No lesions.     Palate: No mass.     Pharynx: No pharyngeal swelling or oropharyngeal exudate.  Pulmonary:     Comments: Soft bilateral upper airway rhonchi noted Neurological:     Mental Status: She is alert.      BP (!) 156/84   Pulse 60   Temp 97.8 F (36.6 C) (Oral)   Resp 16   Ht 5' 2  (1.575 m)   Wt 157 lb (71.2 kg)   SpO2 99%   BMI 28.72 kg/m  Wt Readings from Last 3 Encounters:  01/11/24 157 lb (71.2 kg)  12/28/23 157 lb (  71.2 kg)  09/08/23 157 lb 4 oz (71.3 kg)       Assessment & Plan:   Problem List Items Addressed This Visit       Unprioritized   Hypertension   BP up today. Recommend repeat bp check in 2 weeks with nurse. Current treatment includes propranolol  and amlodipine . Continue same.       Other Visit Diagnoses       Community acquired pneumonia, unspecified laterality    -  Primary   Relevant Medications   amoxicillin -clavulanate (AUGMENTIN) 875-125 MG tablet   azithromycin  (ZITHROMAX ) 250 MG tablet   benzonatate  (TESSALON ) 100 MG capsule     Imaging closed at time of today's visit. Will plan empiric treatment based on lung exam for PNA.   I am having Lori Goodwin start on amoxicillin -clavulanate, azithromycin , and benzonatate . I am also having her maintain her multivitamin with minerals, Vitamin D, SUMAtriptan , loratadine, propranolol  ER, amLODipine , atorvastatin , and clobetasol .  Meds ordered this encounter  Medications   clobetasol  (TEMOVATE ) 0.05 % external solution    Sig: Apply topically daily as needed.    Dispense:  50 mL    Refill:  1   amoxicillin -clavulanate (AUGMENTIN) 875-125 MG tablet    Sig: Take 1 tablet by mouth 2 (two) times daily.    Dispense:  20 tablet    Refill:  0    Supervising Provider:   DOMENICA BLACKBIRD A [[id number]]   azithromycin  (ZITHROMAX ) 250 MG tablet    Sig: Take 2 tablets on day 1, then 1 tablet daily on days 2 through 5    Dispense:  6 tablet    Refill:  0    Supervising Provider:   DOMENICA BLACKBIRD A [[id number]]   benzonatate  (TESSALON ) 100 MG capsule    Sig: Take 1 capsule (100 mg total) by mouth 3 (three) times daily as needed for cough.    Dispense:  20 capsule    Refill:  0    Supervising Provider:   DOMENICA BLACKBIRD A [[id number]]

## 2024-01-12 NOTE — Assessment & Plan Note (Signed)
 BP up today. Recommend repeat bp check in 2 weeks with nurse. Current treatment includes propranolol  and amlodipine . Continue same.

## 2024-01-20 ENCOUNTER — Telehealth: Payer: Self-pay

## 2024-01-20 NOTE — Telephone Encounter (Signed)
 Spoke w/ Pt- informed to complete abx, and would recommend she wait 1-2 weeks after completion before getting any vaccines. Pt verbalized understanding.

## 2024-01-20 NOTE — Telephone Encounter (Signed)
 Copied from CRM [id number]. Topic: Clinical - Medication Question >> Jan 20, 2024 10:09 AM Shereese L wrote: Reason for CRM: Patient would like a call back to go over if she can get the flu shot if she's still taking antibiotics from pneumonia

## 2024-01-24 ENCOUNTER — Ambulatory Visit: Admitting: Internal Medicine

## 2024-01-24 ENCOUNTER — Encounter: Payer: Self-pay | Admitting: Internal Medicine

## 2024-01-24 VITALS — BP 118/78 | HR 50 | Temp 98.2°F | Resp 16 | Ht 62.0 in | Wt 158.0 lb

## 2024-01-24 DIAGNOSIS — J189 Pneumonia, unspecified organism: Secondary | ICD-10-CM

## 2024-01-24 NOTE — Progress Notes (Unsigned)
 Subjective:    Patient ID: Lori Goodwin, female    DOB: January 09, 1946, 78 y.o.   MRN: [id number]  DOS:  01/24/2024 Follow-up   Discussed the use of AI scribe software for clinical note transcription with the patient, who gave verbal consent to proceed.  History of Present Illness  Seen 01/11/2024, had worsening cough, no fever, + fatigue.  Was Dx with pneumonia, treated with Augmentin, Zithromax .  She is here for follow-up to be sure she is doing okay. She feels much improved. No fever or chills No nausea vomiting Cough has diminished, still have some at night>day time. No chest pain or difficulty breathing      Review of Systems See above   Past Medical History:  Diagnosis Date   Allergic rhinitis    Allergy    SEASONAL   Common migraine    Hyperlipidemia    Hypertension     Past Surgical History:  Procedure Laterality Date   CARPAL TUNNEL RELEASE Left 01/05/2016   CARPAL TUNNEL RELEASE Right 04/2018   COLONOSCOPY     POLYPECTOMY     TYMPANOSTOMY TUBE PLACEMENT      Current Outpatient Medications  Medication Instructions   amLODipine  (NORVASC ) 5 mg, Oral, Daily   amoxicillin -clavulanate (AUGMENTIN) 875-125 MG tablet 1 tablet, Oral, 2 times daily   atorvastatin  (LIPITOR) 10 mg, Oral, Daily at bedtime   benzonatate  (TESSALON ) 100 mg, Oral, 3 times daily PRN   Cholecalciferol (VITAMIN D) 2000 units CAPS 1 capsule   clobetasol  (TEMOVATE ) 0.05 % external solution Topical, Daily PRN   loratadine (CLARITIN) 10 MG tablet    Multiple Vitamin (MULTIVITAMIN WITH MINERALS) TABS 1 tablet, Daily   propranolol  ER (INDERAL  LA) 60 mg, Oral, Daily   SUMAtriptan  (IMITREX ) 50 MG tablet TAKE 1-2 TABLETS BY MOUTH AS NEEDED FOR MIGRAINE. MAY REPEAT IN 2 HOURS IF HEADACHE PERSISTS OR RECURS       Objective:   Physical Exam BP 118/78   Pulse (!) 50   Temp 98.2 F (36.8 C) (Oral)   Resp 16   Ht 5' 2 (1.575 m)   Wt 158 lb (71.7 kg)   SpO2 98%   BMI 28.90 kg/m   General:   Well developed, NAD, BMI noted. HEENT:  Normocephalic . Face symmetric, atraumatic Lungs:  CTA B Normal respiratory effort, no intercostal retractions, no accessory muscle use. Heart: RRR,  no murmur.  Lower extremities: no pretibial edema bilaterally  Skin: Not pale. Not jaundice Neurologic:  alert & oriented X3.  Speech normal, gait appropriate for age and unassisted Psych--  Cognition and judgment appear intact.  Cooperative with normal attention span and concentration.  Behavior appropriate. No anxious or depressed appearing.      Assessment   Assessment  Prediabetes: A1c 6.0 (03-2015) Dyslipidemia HTN (on BB) MSK: --DJD --Neck pain: Saw neurosurgery, MRI was done, DX DJD, nonsurgical candidate (2013 ) Osteopenia first  T score -1.2 (06/2016), -2.3 ( June 2023) Migraines  (onn BB and imitrex ) Ear canal eczema: topical steroids prn  Assessment and Plan Assessment & Plan Community-acquired pneumonia, walking pneumonia. Diagnosed recently on clinical grounds, no chest x-ray, treated with Augmentin and Zithromax  which she finished.  Feeling much improved also mild persisting cough. Tessalon  Perles did not work well for her. OTC Robitussin does work.   Plan: Continue Robitussin  for the next 10 days and call if she is not gradually improving.  Mild fatigue should also complete the results in the next few days. RTC CPX  is scheduled for June

## 2024-01-24 NOTE — Patient Instructions (Signed)
  VISIT SUMMARY: You came in for a follow-up after a recent pneumonia diagnosis. Your cough is less frequent during the day but still disrupts your sleep at night. You have completed your antibiotics, and your initial wheezing has resolved.  YOUR PLAN: PERSISTENT COUGH: You have a persistent cough that is disrupting your sleep at night. -Continue using Robitussin at night for symptomatic relief for the next 10 days. Afebrile gradually improving let us  know  See you in June for your physical

## 2024-01-25 NOTE — Assessment & Plan Note (Signed)
 Community-acquired pneumonia, walking pneumonia. Diagnosed recently on clinical grounds, no chest x-ray, treated with Augmentin and Zithromax  which she finished.   Feeling much improved with only mild persistent cough, fatigue. Tessalon  Perles did not work well for her. OTC Robitussin does work.   Plan: Continue Robitussin  for the next 10 days and call if she is not gradually improving.  Mild fatigue should also complete the results in the next few days. RTC CPX is scheduled for June

## 2024-02-08 ENCOUNTER — Ambulatory Visit

## 2024-03-24 ENCOUNTER — Ambulatory Visit: Payer: Self-pay

## 2024-03-24 NOTE — Telephone Encounter (Signed)
 Reason for Disposition  [1] Anxiety symptoms AND [2] has not been evaluated for this by doctor (or NP/PA)  Protocols used: Anxiety and Panic Attack-A-AH

## 2024-03-24 NOTE — Telephone Encounter (Signed)
 FYI Only or Action Required?: Action required by provider: request for medication.  Patient was last seen in primary care on 01/24/2024 by Amon Aloysius BRAVO, MD.  Called Nurse Triage reporting Anxiety.  Symptoms began several days ago.  Interventions attempted: Nothing.  Symptoms are: stable.  Triage Disposition: No disposition on file.  Patient/caregiver understands and will follow disposition?:   Answer Assessment - Initial Assessment Questions [facility] [address] Anxiety at night when she tries to go to sleep. Says her minds races. Patient told Dr Amon doesn't have any appointments until February. She says he has offered to give her something for this before and would like to see if he could give her something without being seen. Told her I would send request, and to use UC if needed. 1. CONCERN: Did anything happen that prompted you to call today?      At night, she is not able to relax and not sleep. Says she is okay during the day when busy. 2. ANXIETY SYMPTOMS: Can you describe how you (your loved one; patient) have been feeling? (e.g., tense, restless, panicky, anxious, keyed up, overwhelmed, sense of impending doom).      A lot of thoughts. 3. ONSET: How long have you been feeling this way? (e.g., hours, days, weeks)     1 week 4. SEVERITY: How would you rate the level of anxiety? (e.g., 0 - 10; or mild, moderate, severe).     7 5. FUNCTIONAL IMPAIRMENT: How have these feelings affected your ability to do daily activities? Have you had more difficulty than usual doing your normal daily activities? (e.g., getting better, same, worse; self-care, school, work, interactions)     Denies 6. HISTORY: Have you felt this way before? Have you ever been diagnosed with an anxiety problem in the past? (e.g., generalized anxiety disorder, panic attacks, PTSD). If Yes, ask: How was this problem treated? (e.g., medicines, counseling, etc.)     Denies 7. RISK OF HARM - SUICIDAL  IDEATION: Do you ever have thoughts of hurting or killing yourself? If Yes, ask:  Do you have these feelings now? Do you have a plan on how you would do this?     Denies 11. PATIENT SUPPORT: Who is with you now? Who do you live with? Do you have family or friends who you can talk to?        Has daughter, older son 4. OTHER SYMPTOMS: Do you have any other symptoms? (e.g., feeling depressed, trouble concentrating, trouble sleeping, trouble breathing, palpitations or fast heartbeat, chest pain, sweating, nausea, or diarrhea)       Trouble sleeping  Protocols used: Anxiety and Panic Attack-A-AH

## 2024-03-27 ENCOUNTER — Ambulatory Visit: Admitting: Internal Medicine

## 2024-03-27 ENCOUNTER — Encounter: Payer: Self-pay | Admitting: Internal Medicine

## 2024-03-27 VITALS — BP 126/80 | HR 59 | Temp 97.6°F | Resp 16 | Ht 62.0 in | Wt 159.4 lb

## 2024-03-27 DIAGNOSIS — F419 Anxiety disorder, unspecified: Secondary | ICD-10-CM | POA: Diagnosis not present

## 2024-03-27 DIAGNOSIS — F32A Depression, unspecified: Secondary | ICD-10-CM

## 2024-03-27 DIAGNOSIS — F5104 Psychophysiologic insomnia: Secondary | ICD-10-CM

## 2024-03-27 MED ORDER — ESCITALOPRAM OXALATE 5 MG PO TABS: 5.0000 mg | ORAL_TABLET | Freq: Every day | ORAL | 5 refills | Status: AC

## 2024-03-27 NOTE — Progress Notes (Unsigned)
 "  Subjective:    Patient ID: Lori Goodwin, female    DOB: 03/15/1946, 78 y.o.   MRN: [id number]  DOS:  03/27/2024 Acute visit  Discussed the use of AI scribe software for clinical note transcription with the patient, who gave verbal consent to proceed.  History of Present Illness Lori Goodwin [place] is a 78 year old female who presents with difficulty sleeping and anxiety.  Insomnia - Several months duration of sleep disturbance - Prolonged sleep onset - Frequent nighttime awakenings - Difficulty relaxing and falling back asleep after awakening  Anxiety and emotional distress - Significant anxiety related to son's severe illness, repeated hospitalizations for sepsis due to pressure sores, and current stay in long-term care facility - Feelings of guilt, sadness, and emotional burden related to son's depression - Ongoing family stress from prior divorce - Worries about children's emotional well-being - Episodes of uncontrollable crying - No suicidal ideation  Medication use and uncertainty - Current medications: amlodipine , cholesterol medication, multivitamins, propranolol , sumatriptan  as needed - Uncertainty regarding continuation of propranolol  due to absence of migraines   Review of Systems See above   Past Medical History:  Diagnosis Date   Allergic rhinitis    Allergy    SEASONAL   Common migraine    Hyperlipidemia    Hypertension     Past Surgical History:  Procedure Laterality Date   CARPAL TUNNEL RELEASE Left 01/05/2016   CARPAL TUNNEL RELEASE Right 04/2018   COLONOSCOPY     POLYPECTOMY     TYMPANOSTOMY TUBE PLACEMENT      Current Outpatient Medications  Medication Instructions   amLODipine  (NORVASC ) 5 mg, Oral, Daily   atorvastatin  (LIPITOR) 10 mg, Oral, Daily at bedtime   Cholecalciferol (VITAMIN D) 2000 units CAPS 1 capsule   clobetasol  (TEMOVATE ) 0.05 % external solution Topical, Daily PRN   escitalopram  (LEXAPRO ) 5 mg, Oral, Daily    loratadine (CLARITIN) 10 MG tablet    Multiple Vitamin (MULTIVITAMIN WITH MINERALS) TABS 1 tablet, Daily   propranolol  ER (INDERAL  LA) 60 mg, Oral, Daily   SUMAtriptan  (IMITREX ) 50 MG tablet TAKE 1-2 TABLETS BY MOUTH AS NEEDED FOR MIGRAINE. MAY REPEAT IN 2 HOURS IF HEADACHE PERSISTS OR RECURS       Objective:   Physical Exam BP 126/80   Pulse (!) 59   Temp 97.6 F (36.4 C) (Oral)   Resp 16   Ht 5' 2 (1.575 m)   Wt 159 lb 6 oz (72.3 kg)   SpO2 96%   BMI 29.15 kg/m  General:   Well developed, NAD, BMI noted. HEENT:  Normocephalic . Face symmetric, atraumatic  Skin: Not pale. Not jaundice Neurologic:  alert & oriented X3.  Speech normal, gait appropriate for age and unassisted Psych--  Cognition and judgment appear intact.  Cooperative with normal attention span and concentration.  Behavior appropriate. No anxious or depressed appearing.      Assessment   Assessment  Prediabetes: A1c 6.0 (03-2015) Dyslipidemia HTN (on BB) MSK: --DJD --Neck pain: Saw neurosurgery, MRI was done, DX DJD, nonsurgical candidate (2013 ) Osteopenia first  T score -1.2 (06/2016), -2.3 ( June 2023) Migraines  (onn BB and imitrex ) Ear canal eczema: topical steroids prn  Assessment & Plan Adjustment disorder with anxiety and insomnia -Anxiety and insomnia due to family stressors, particularly her son's health issues.  He is paraplegic, has been admitted twice with sepsis due to pressure ulcers, he is still in the hospital. -Listening therapy provided.  Denies suicidal ideas.                                                                          -  Has an appointment to see a counselor in several weeks.  Provided list of local counselors hoping that she could see somebody sooner. -Extensive discussion about SSRIs, side effects, suicidal ideation etc.  We agreed to start Lexapro  5 mg at nighttime, she knows it is a low dose, we could increase to 10 mg if so desired.  She will let me know. -  Advised to monitor for side effects such as nausea, dizziness, or sleepiness. Depressive disorder, single episode Symptoms include guilt, crying spells, and emotional distress related to family issues. No suicidal ideation.  See above RTC scheduled for June, come back sooner if needed.     "

## 2024-03-27 NOTE — Telephone Encounter (Signed)
 Please arrange office visit for this week,

## 2024-03-27 NOTE — Patient Instructions (Signed)
"  °  ADJUSTMENT DISORDER WITH ANXIETY AND INSOMNIA: You are experiencing anxiety and insomnia due to family stressors, particularly your son's health issues. -I provided you with a list of local counselors and resources for counseling. -I prescribed Lexapro  5 mg to take at night. -Please monitor for side effects such as nausea, dizziness, or sleepiness. -Follow up with a counselor and consider increasing the Lexapro  dose if needed after one month. -Okay to start taking over-the-counter melatonin daily.   You have a follow-up with me in June.  Come back sooner if needed      "

## 2024-03-27 NOTE — Telephone Encounter (Signed)
 Appt scheduled

## 2024-03-28 NOTE — Assessment & Plan Note (Addendum)
 Adjustment disorder with anxiety and insomnia -Anxiety and insomnia due to family stressors, particularly her son's health issues.  He is paraplegic, has been admitted twice with sepsis due to pressure ulcers, he is still in the hospital. -Listening therapy provided.  Denies suicidal ideas.                                                                          - Has an appointment to see a counselor in several weeks.  Provided list of local counselors hoping that she could see somebody sooner. -Extensive discussion about SSRIs, side effects, suicidal ideation etc.  We agreed to start Lexapro  5 mg at nighttime, she knows it is a low dose, we could increase to 10 mg if so desired.  She will let me know. - Advised to monitor for side effects such as nausea, dizziness, or sleepiness. Depressive disorder, single episode Symptoms include guilt, crying spells, and emotional distress related to family issues. No suicidal ideation.  See above RTC scheduled for June, come back sooner if needed.

## 2024-04-04 ENCOUNTER — Other Ambulatory Visit: Payer: Self-pay | Admitting: Internal Medicine

## 2024-04-25 ENCOUNTER — Ambulatory Visit: Admitting: Internal Medicine

## 2024-06-06 ENCOUNTER — Ambulatory Visit: Admitting: Professional Counselor

## 2024-09-11 ENCOUNTER — Encounter: Admitting: Internal Medicine

## 2025-01-11 ENCOUNTER — Ambulatory Visit
